# Patient Record
Sex: Female | Born: 1935 | Race: White | Hispanic: No | Marital: Married | Smoking: Former smoker
Health system: Southern US, Community
[De-identification: ages and names within clinical notes are randomized; demographics above are authoritative.]

## PROBLEM LIST (undated history)

## (undated) DIAGNOSIS — C679 Malignant neoplasm of bladder, unspecified: Secondary | ICD-10-CM

## (undated) DIAGNOSIS — I10 Essential (primary) hypertension: Secondary | ICD-10-CM

## (undated) DIAGNOSIS — K219 Gastro-esophageal reflux disease without esophagitis: Secondary | ICD-10-CM

## (undated) DIAGNOSIS — I472 Ventricular tachycardia, unspecified: Secondary | ICD-10-CM

## (undated) DIAGNOSIS — R51 Headache: Secondary | ICD-10-CM

## (undated) DIAGNOSIS — R55 Syncope and collapse: Secondary | ICD-10-CM

## (undated) DIAGNOSIS — R3129 Other microscopic hematuria: Secondary | ICD-10-CM

## (undated) DIAGNOSIS — I493 Ventricular premature depolarization: Secondary | ICD-10-CM

## (undated) DIAGNOSIS — W19XXXA Unspecified fall, initial encounter: Secondary | ICD-10-CM

## (undated) DIAGNOSIS — F419 Anxiety disorder, unspecified: Secondary | ICD-10-CM

## (undated) DIAGNOSIS — E162 Hypoglycemia, unspecified: Secondary | ICD-10-CM

## (undated) DIAGNOSIS — Y92009 Unspecified place in unspecified non-institutional (private) residence as the place of occurrence of the external cause: Secondary | ICD-10-CM

## (undated) DIAGNOSIS — I471 Supraventricular tachycardia, unspecified: Secondary | ICD-10-CM

## (undated) DIAGNOSIS — M5090 Cervical disc disorder, unspecified, unspecified cervical region: Secondary | ICD-10-CM

## (undated) DIAGNOSIS — R011 Cardiac murmur, unspecified: Secondary | ICD-10-CM

## (undated) DIAGNOSIS — I447 Left bundle-branch block, unspecified: Secondary | ICD-10-CM

## (undated) DIAGNOSIS — M47812 Spondylosis without myelopathy or radiculopathy, cervical region: Secondary | ICD-10-CM

## (undated) DIAGNOSIS — N1330 Unspecified hydronephrosis: Secondary | ICD-10-CM

## (undated) DIAGNOSIS — I2510 Atherosclerotic heart disease of native coronary artery without angina pectoris: Secondary | ICD-10-CM

## (undated) DIAGNOSIS — N12 Tubulo-interstitial nephritis, not specified as acute or chronic: Secondary | ICD-10-CM

## (undated) DIAGNOSIS — E785 Hyperlipidemia, unspecified: Secondary | ICD-10-CM

## (undated) DIAGNOSIS — I6529 Occlusion and stenosis of unspecified carotid artery: Secondary | ICD-10-CM

## (undated) DIAGNOSIS — Z9049 Acquired absence of other specified parts of digestive tract: Secondary | ICD-10-CM

## (undated) DIAGNOSIS — M8580 Other specified disorders of bone density and structure, unspecified site: Secondary | ICD-10-CM

## (undated) DIAGNOSIS — T85528A Displacement of other gastrointestinal prosthetic devices, implants and grafts, initial encounter: Secondary | ICD-10-CM

## (undated) DIAGNOSIS — R12 Heartburn: Secondary | ICD-10-CM

## (undated) DIAGNOSIS — F0390 Unspecified dementia without behavioral disturbance: Secondary | ICD-10-CM

## (undated) DIAGNOSIS — N816 Rectocele: Secondary | ICD-10-CM

## (undated) HISTORY — DX: Ventricular premature depolarization: I49.3

## (undated) HISTORY — PX: OTHER SURGICAL HISTORY: SHX169

## (undated) HISTORY — DX: Tubulo-interstitial nephritis, not specified as acute or chronic: N12

## (undated) HISTORY — PX: CYSTECTOMY: SUR359

## (undated) HISTORY — DX: Unspecified dementia, unspecified severity, without behavioral disturbance, psychotic disturbance, mood disturbance, and anxiety: F03.90

## (undated) HISTORY — DX: Ventricular tachycardia: I47.2

## (undated) HISTORY — PX: ABDOMINAL HYSTERECTOMY: SHX81

## (undated) HISTORY — DX: Supraventricular tachycardia: I47.1

## (undated) HISTORY — PX: HERNIA REPAIR: SHX51

## (undated) HISTORY — DX: Anxiety disorder, unspecified: F41.9

## (undated) HISTORY — DX: Heartburn: R12

## (undated) HISTORY — DX: Malignant neoplasm of bladder, unspecified: C67.9

## (undated) HISTORY — DX: Supraventricular tachycardia, unspecified: I47.10

## (undated) HISTORY — DX: Spondylosis without myelopathy or radiculopathy, cervical region: M47.812

## (undated) HISTORY — DX: Rectocele: N81.6

## (undated) HISTORY — DX: Headache: R51

## (undated) HISTORY — DX: Hyperlipidemia, unspecified: E78.5

## (undated) HISTORY — PX: TOTAL ABDOMINAL HYSTERECTOMY W/ BILATERAL SALPINGOOPHORECTOMY: SHX83

## (undated) HISTORY — DX: Unspecified hydronephrosis: N13.30

## (undated) HISTORY — DX: Other microscopic hematuria: R31.29

## (undated) HISTORY — PX: FRACTURE SURGERY: SHX138

## (undated) HISTORY — DX: Ventricular tachycardia, unspecified: I47.20

## (undated) HISTORY — DX: Left bundle-branch block, unspecified: I44.7

## (undated) HISTORY — DX: Hypoglycemia, unspecified: E16.2

## (undated) HISTORY — DX: Atherosclerotic heart disease of native coronary artery without angina pectoris: I25.10

## (undated) HISTORY — PX: CARDIAC CATHETERIZATION: SHX172

## (undated) HISTORY — DX: Unspecified place in unspecified non-institutional (private) residence as the place of occurrence of the external cause: Y92.009

## (undated) HISTORY — PX: URETEROSTOMY: SHX495

## (undated) HISTORY — DX: Unspecified fall, initial encounter: W19.XXXA

## (undated) HISTORY — DX: Displacement of other gastrointestinal prosthetic devices, implants and grafts, initial encounter: T85.528A

## (undated) HISTORY — DX: Cervical disc disorder, unspecified, unspecified cervical region: M50.90

## (undated) HISTORY — DX: Occlusion and stenosis of unspecified carotid artery: I65.29

## (undated) HISTORY — DX: Syncope and collapse: R55

## (undated) HISTORY — DX: Other specified disorders of bone density and structure, unspecified site: M85.80

---

## 2003-11-10 ENCOUNTER — Ambulatory Visit (HOSPITAL_COMMUNITY): Admission: RE | Admit: 2003-11-10 | Discharge: 2003-11-10 | Payer: Self-pay | Admitting: Urology

## 2003-12-03 ENCOUNTER — Ambulatory Visit (HOSPITAL_COMMUNITY): Admission: RE | Admit: 2003-12-03 | Discharge: 2003-12-03 | Payer: Self-pay | Admitting: Urology

## 2004-07-19 ENCOUNTER — Ambulatory Visit (HOSPITAL_COMMUNITY): Admission: RE | Admit: 2004-07-19 | Discharge: 2004-07-19 | Payer: Self-pay | Admitting: Urology

## 2004-11-22 ENCOUNTER — Ambulatory Visit: Payer: Self-pay | Admitting: Gastroenterology

## 2005-07-04 ENCOUNTER — Emergency Department (HOSPITAL_COMMUNITY): Admission: EM | Admit: 2005-07-04 | Discharge: 2005-07-05 | Payer: Self-pay | Admitting: Emergency Medicine

## 2005-08-15 ENCOUNTER — Ambulatory Visit: Payer: Self-pay | Admitting: Internal Medicine

## 2005-08-20 ENCOUNTER — Emergency Department (HOSPITAL_COMMUNITY): Admission: EM | Admit: 2005-08-20 | Discharge: 2005-08-20 | Payer: Self-pay | Admitting: Emergency Medicine

## 2006-06-09 ENCOUNTER — Emergency Department (HOSPITAL_COMMUNITY): Admission: EM | Admit: 2006-06-09 | Discharge: 2006-06-09 | Payer: Self-pay | Admitting: Emergency Medicine

## 2007-05-25 ENCOUNTER — Emergency Department (HOSPITAL_COMMUNITY): Admission: EM | Admit: 2007-05-25 | Discharge: 2007-05-25 | Payer: Self-pay | Admitting: Emergency Medicine

## 2007-10-08 ENCOUNTER — Inpatient Hospital Stay (HOSPITAL_COMMUNITY): Admission: EM | Admit: 2007-10-08 | Discharge: 2007-10-09 | Payer: Self-pay | Admitting: Emergency Medicine

## 2007-10-08 ENCOUNTER — Ambulatory Visit: Payer: Self-pay | Admitting: *Deleted

## 2007-10-09 ENCOUNTER — Encounter (INDEPENDENT_AMBULATORY_CARE_PROVIDER_SITE_OTHER): Payer: Self-pay | Admitting: Gastroenterology

## 2007-10-10 ENCOUNTER — Observation Stay (HOSPITAL_COMMUNITY): Admission: RE | Admit: 2007-10-10 | Discharge: 2007-10-11 | Payer: Self-pay | Admitting: Cardiology

## 2007-10-10 ENCOUNTER — Encounter (INDEPENDENT_AMBULATORY_CARE_PROVIDER_SITE_OTHER): Payer: Self-pay | Admitting: Cardiology

## 2008-06-06 ENCOUNTER — Inpatient Hospital Stay (HOSPITAL_COMMUNITY): Admission: EM | Admit: 2008-06-06 | Discharge: 2008-06-08 | Payer: Self-pay | Admitting: Emergency Medicine

## 2008-07-21 ENCOUNTER — Inpatient Hospital Stay (HOSPITAL_COMMUNITY): Admission: EM | Admit: 2008-07-21 | Discharge: 2008-07-26 | Payer: Self-pay | Admitting: Emergency Medicine

## 2008-08-06 DIAGNOSIS — R05 Cough: Secondary | ICD-10-CM | POA: Insufficient documentation

## 2008-08-06 DIAGNOSIS — R053 Chronic cough: Secondary | ICD-10-CM | POA: Insufficient documentation

## 2008-08-09 ENCOUNTER — Ambulatory Visit: Payer: Self-pay | Admitting: Internal Medicine

## 2008-08-09 DIAGNOSIS — Z8551 Personal history of malignant neoplasm of bladder: Secondary | ICD-10-CM

## 2008-08-20 ENCOUNTER — Encounter: Admission: RE | Admit: 2008-08-20 | Discharge: 2008-08-20 | Payer: Self-pay | Admitting: Internal Medicine

## 2008-11-16 ENCOUNTER — Emergency Department (HOSPITAL_COMMUNITY): Admission: EM | Admit: 2008-11-16 | Discharge: 2008-11-16 | Payer: Self-pay | Admitting: Emergency Medicine

## 2009-08-02 ENCOUNTER — Ambulatory Visit (HOSPITAL_COMMUNITY): Admission: RE | Admit: 2009-08-02 | Discharge: 2009-08-02 | Payer: Self-pay | Admitting: Urology

## 2009-09-27 ENCOUNTER — Ambulatory Visit: Payer: Self-pay | Admitting: Vascular Surgery

## 2010-04-23 IMAGING — CR DG CHEST 2V
2 series · 2 of 2 positions shown · non-contrast
Comparison: 06/06/2008

CLINICAL DATA: Cough, fever

CHEST - 2 VIEW

[w chest pa]
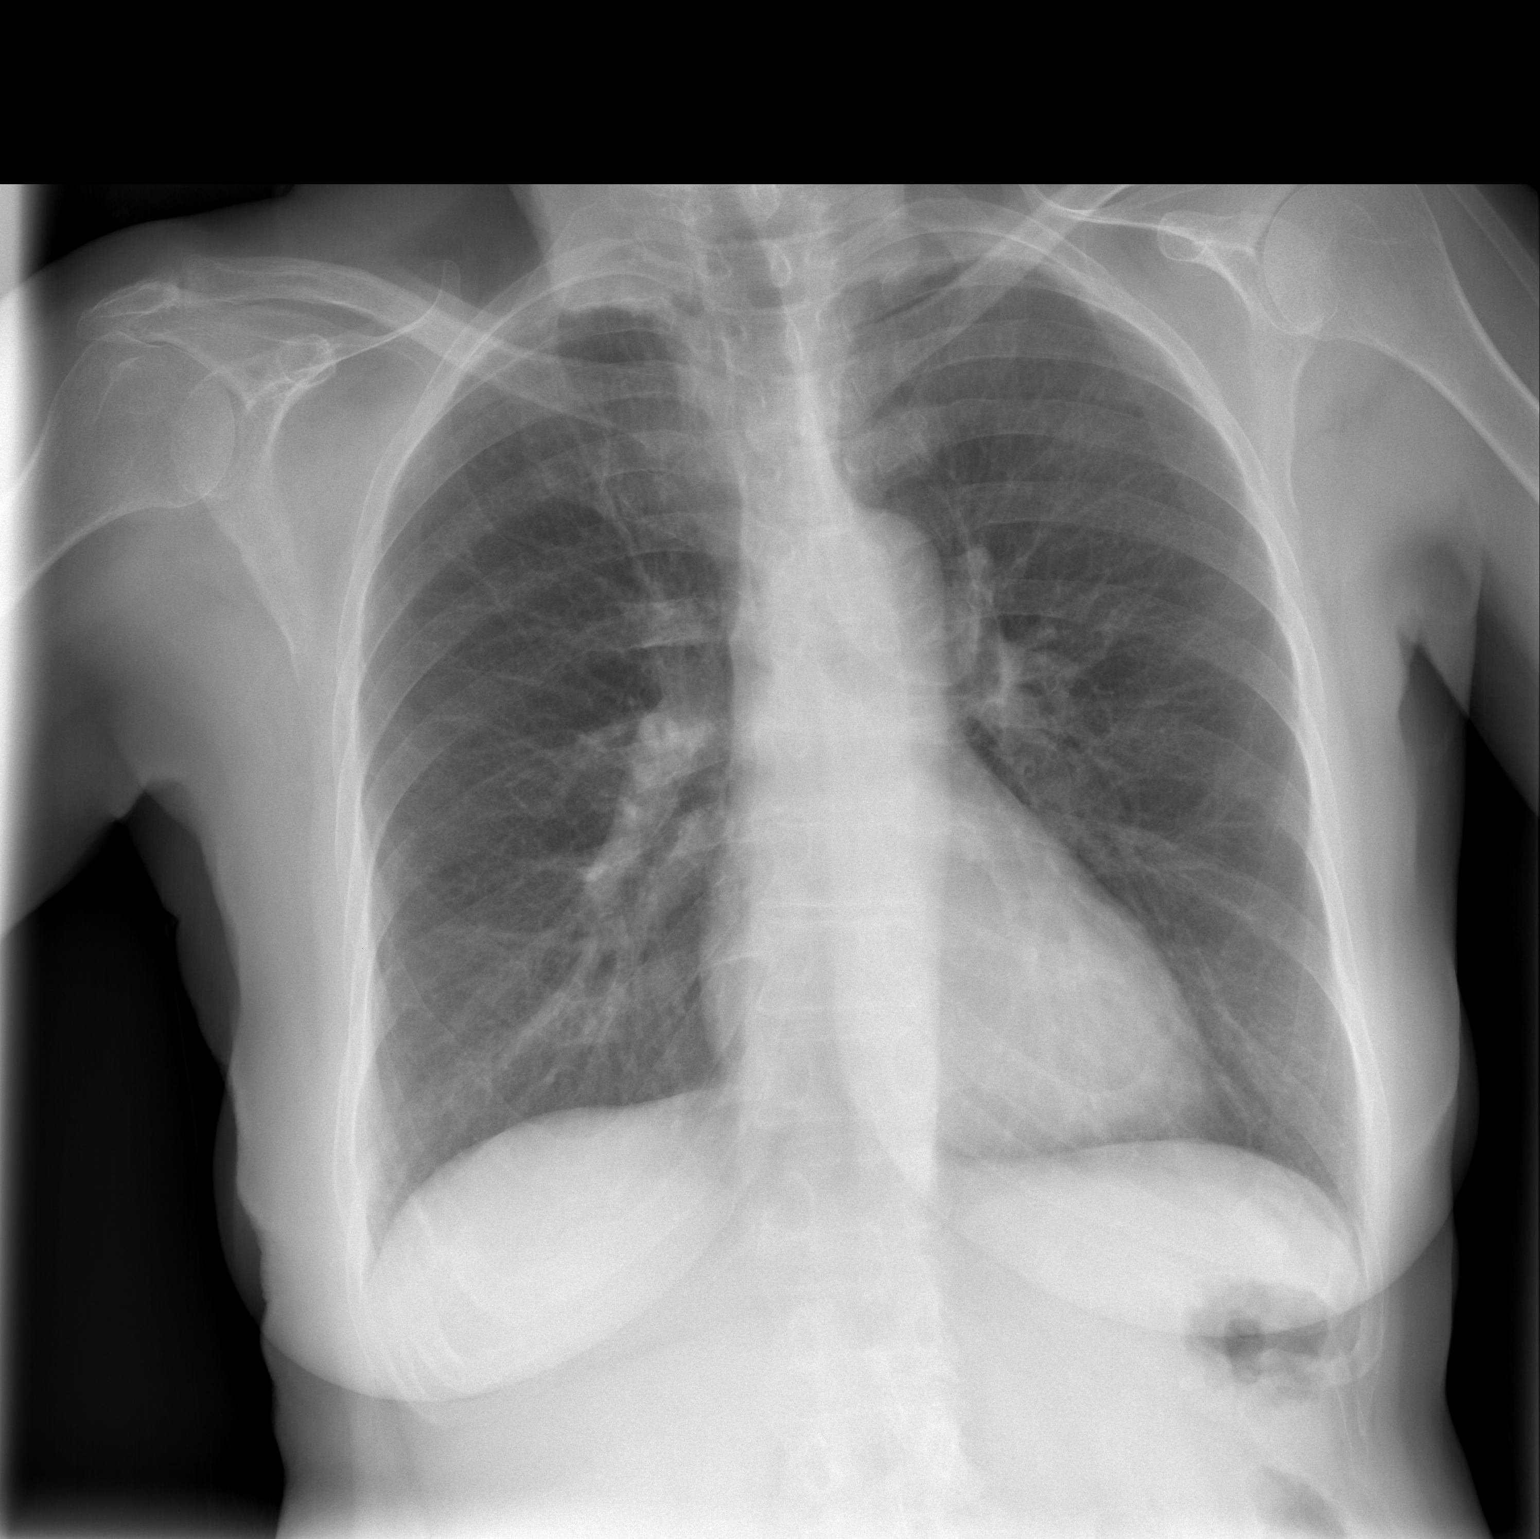

[w chest lat]
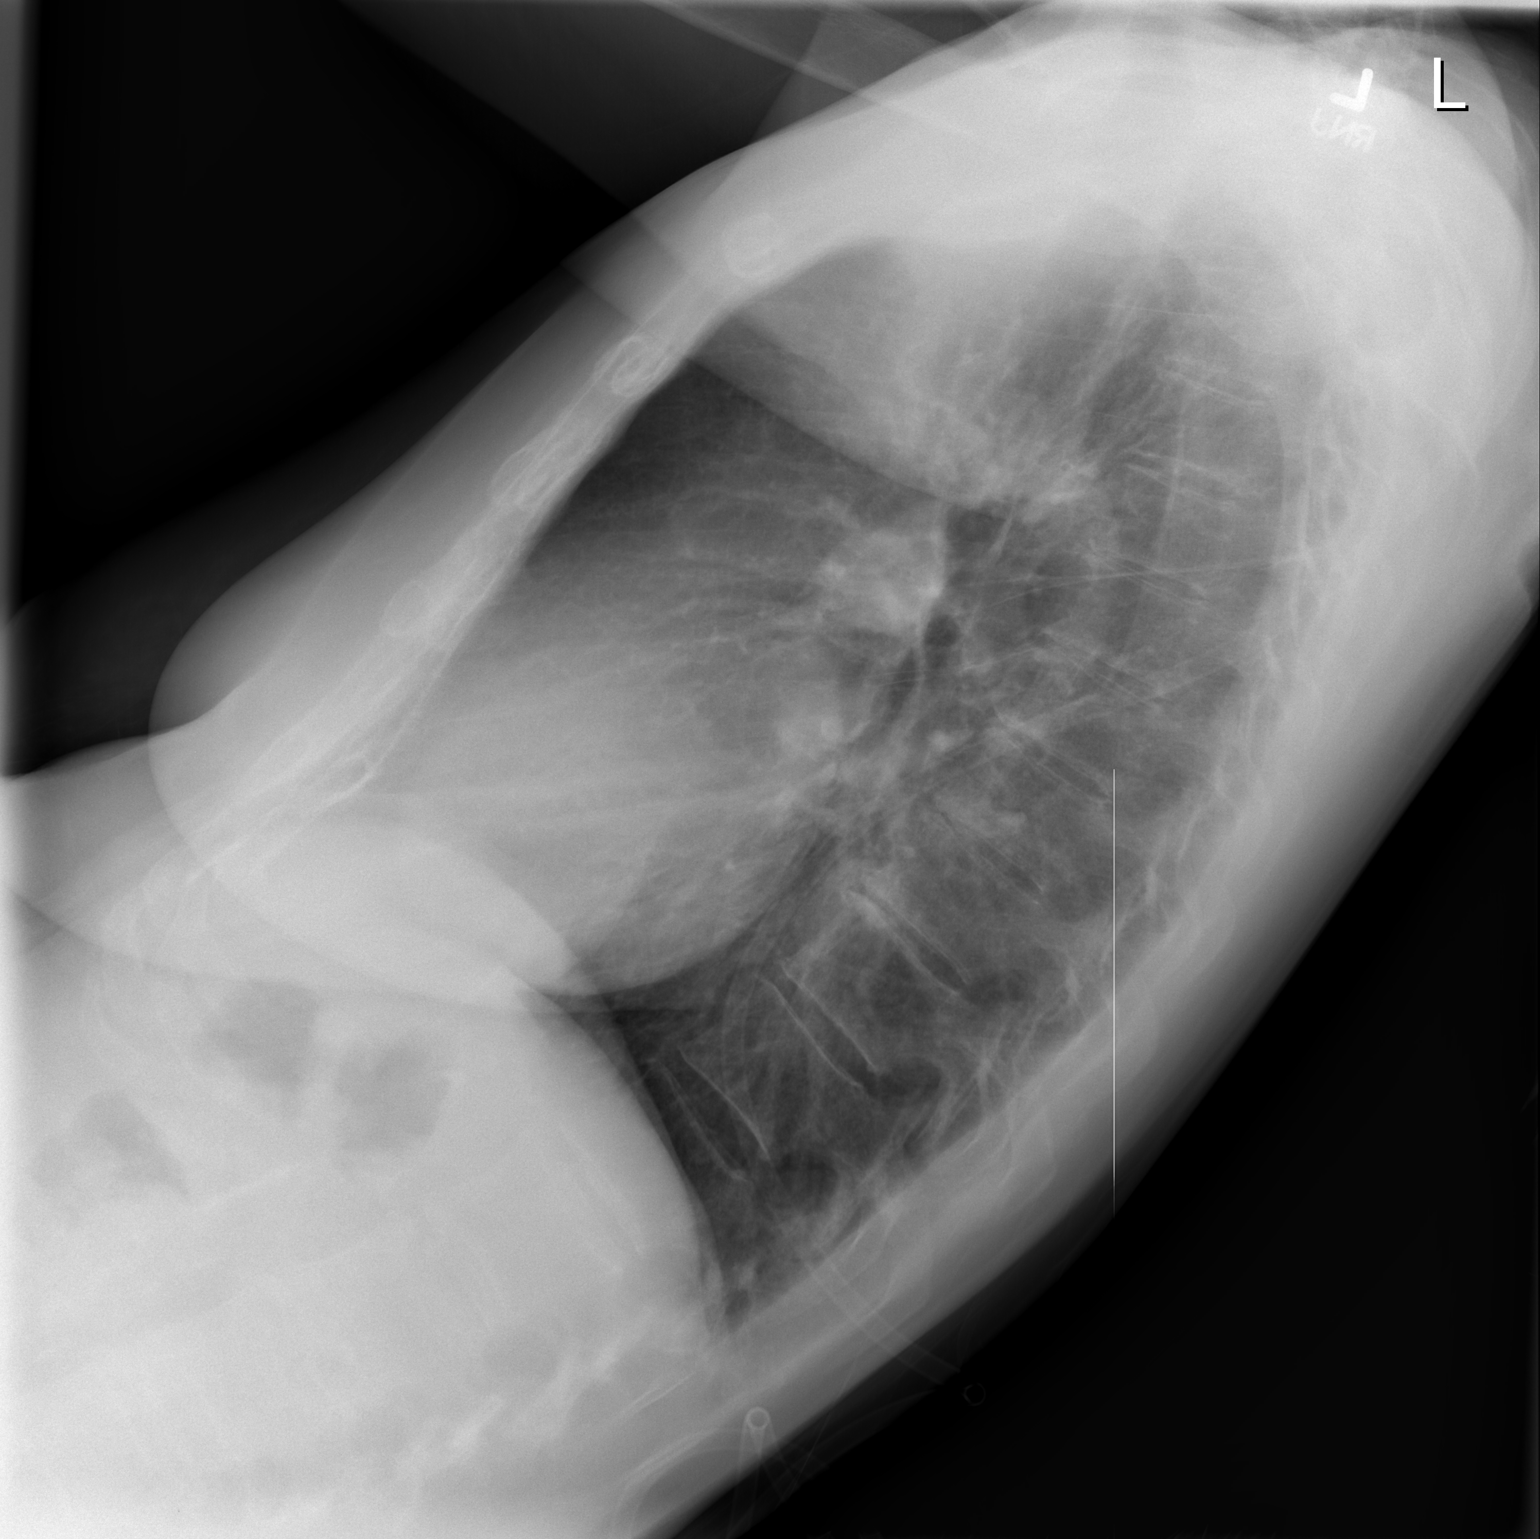

[2 of 2 positions shown; findings below may reference images not displayed]

FINDINGS: Heart is upper limits normal in size.  Lungs are clear.
No effusions.  No acute bony abnormality.
IMPRESSION: No acute findings.

## 2010-04-23 IMAGING — CT CT HEAD W/O CM
1 series · 16 of 30 positions shown, 20 images · non-contrast
Comparison: 05/25/2007

CLINICAL DATA: Confusion and fever

CT HEAD WITHOUT CONTRAST
TECHNIQUE: Contiguous axial images were obtained from the base of
the skull through the vertex without contrast.

[Series 2: headseq 4.8 h45s · axial · 0.43mm/px · z∈[-183,-31]mm · 16 of 36 slices shown, 20 images]
[im 2/36  brain]
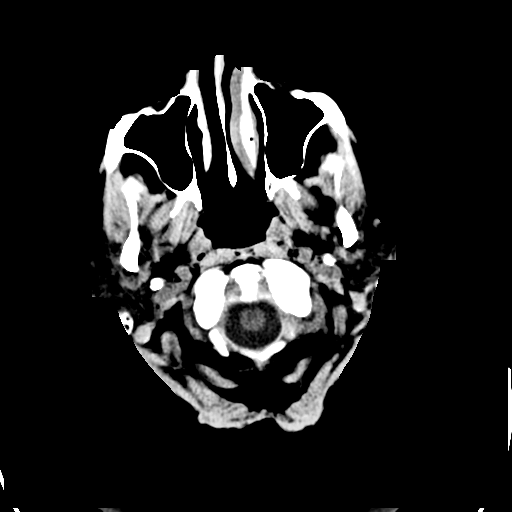
[im 2/36  bone]
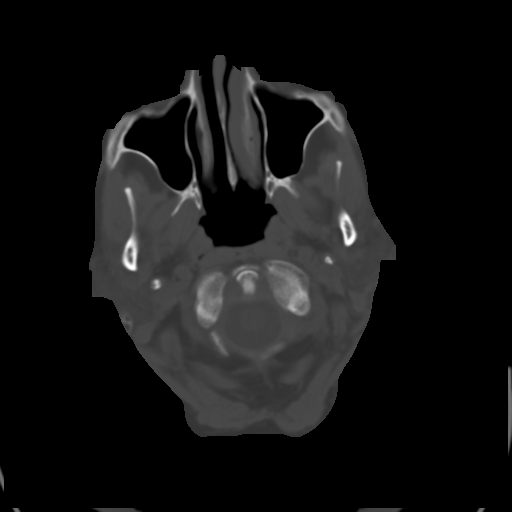
[im 4/36  brain]
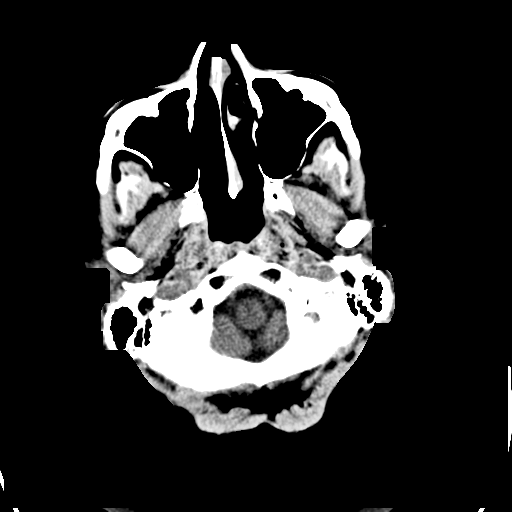
[im 7/36  brain]
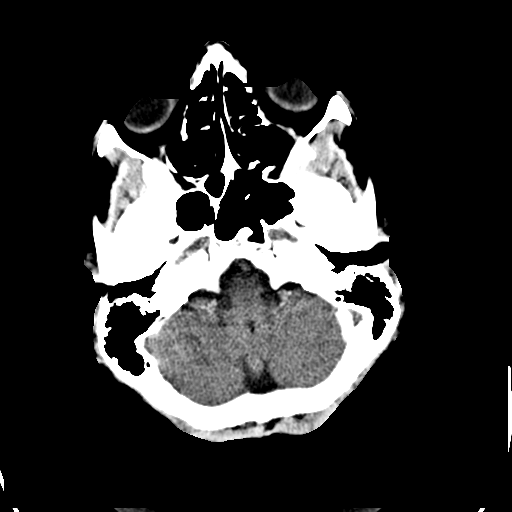
[im 9/36  brain]
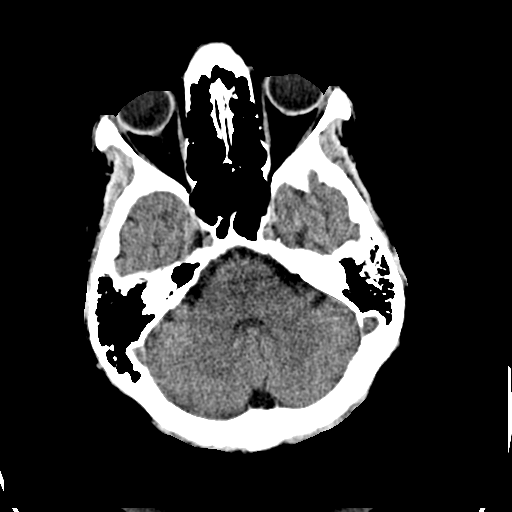
[im 10/36  brain]
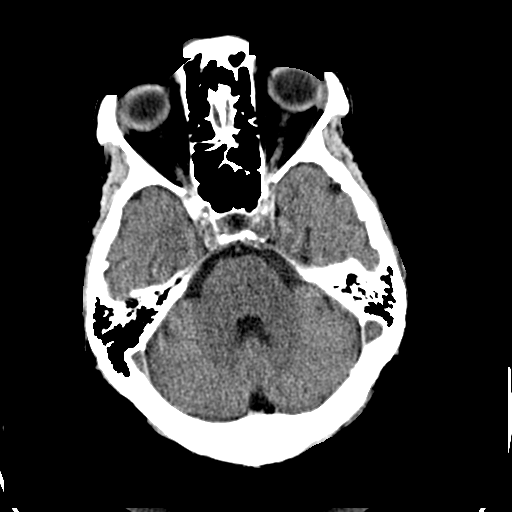
[im 10/36  bone]
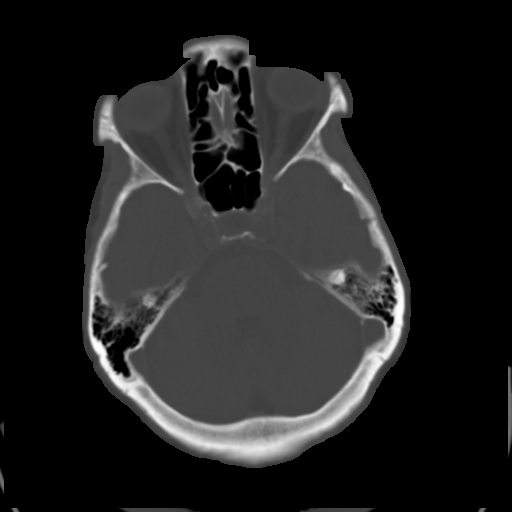
[im 13/36  brain]
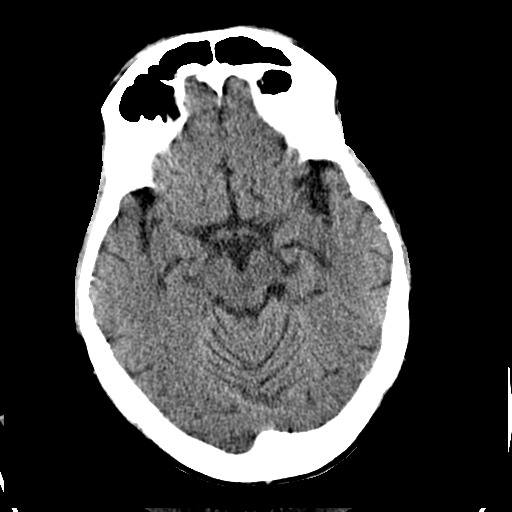
[im 15/36  brain]
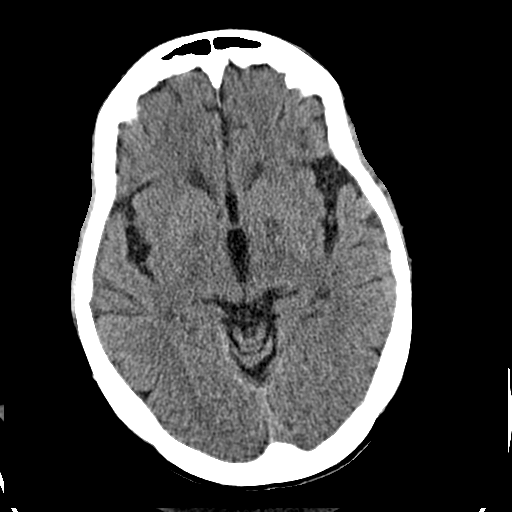
[im 17/36  brain]
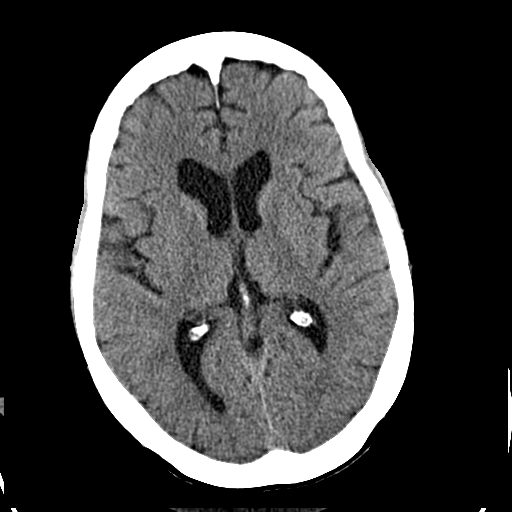
[im 19/36  brain]
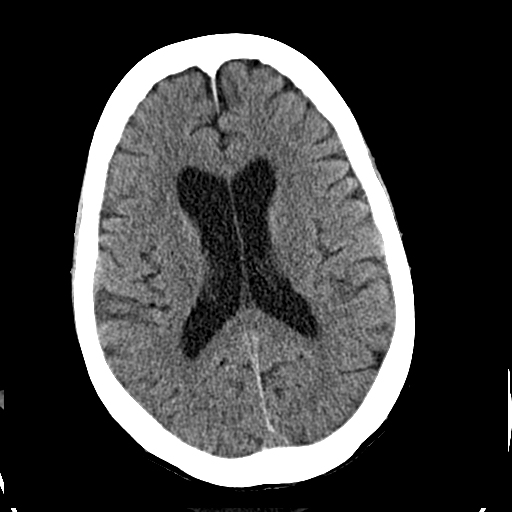
[im 19/36  bone]
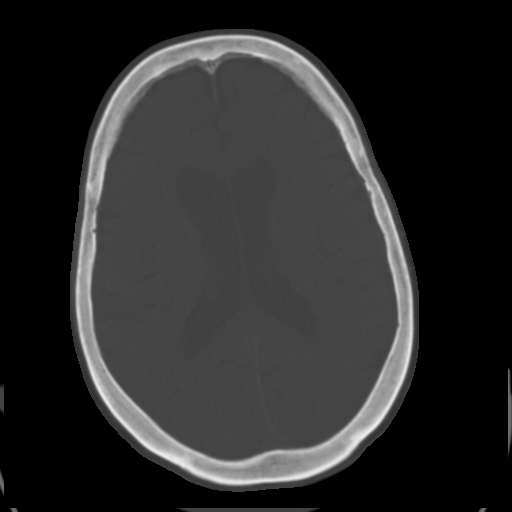
[im 21/36  brain]
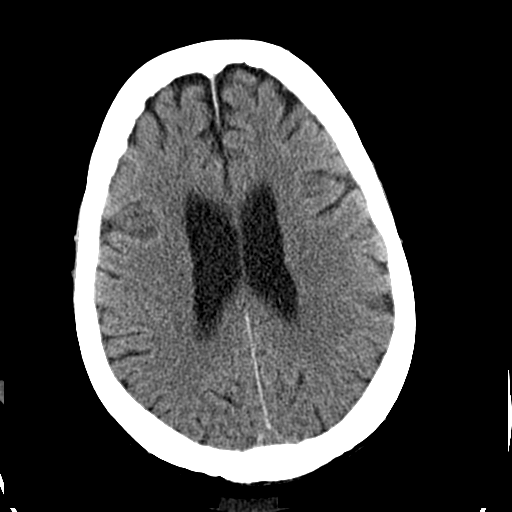
[im 23/36  brain]
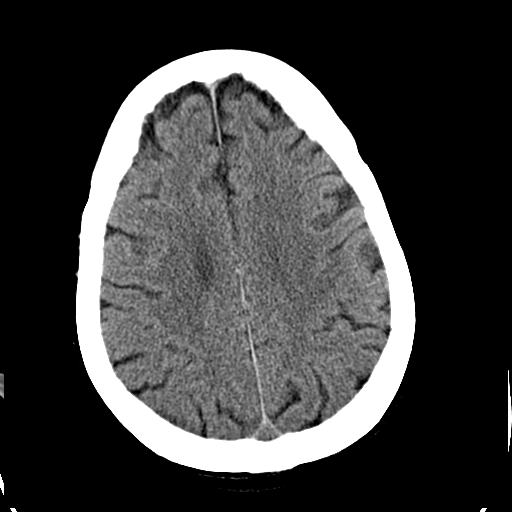
[im 26/36  brain]
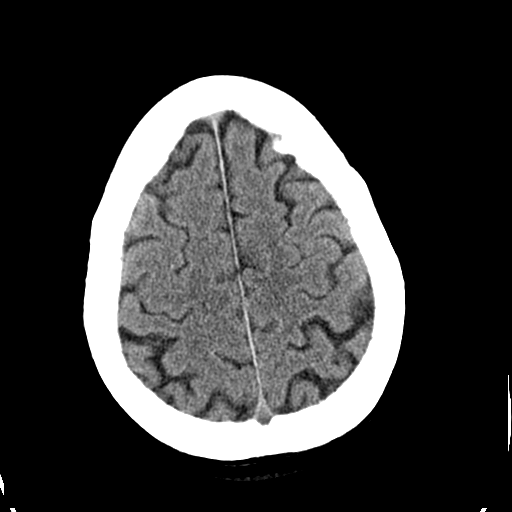
[im 27/36  brain]
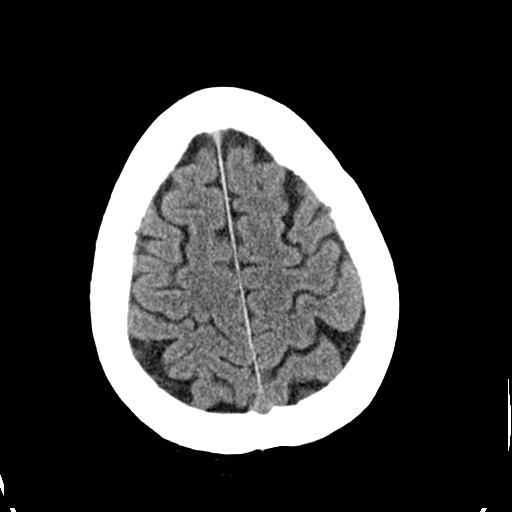
[im 27/36  bone]
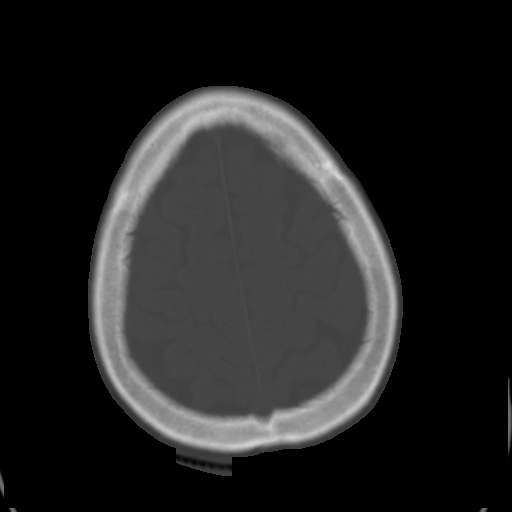
[im 29/36  brain]
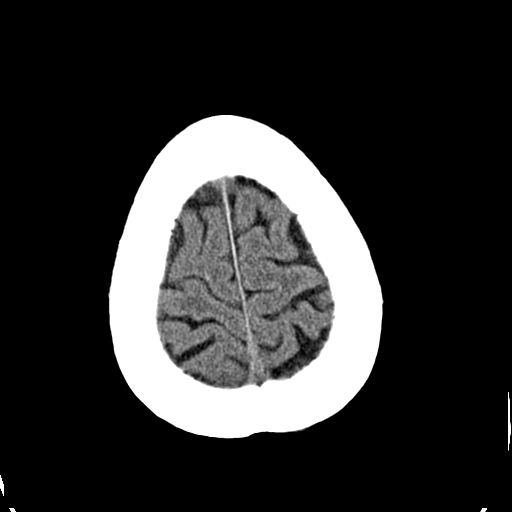
[im 32/36  brain]
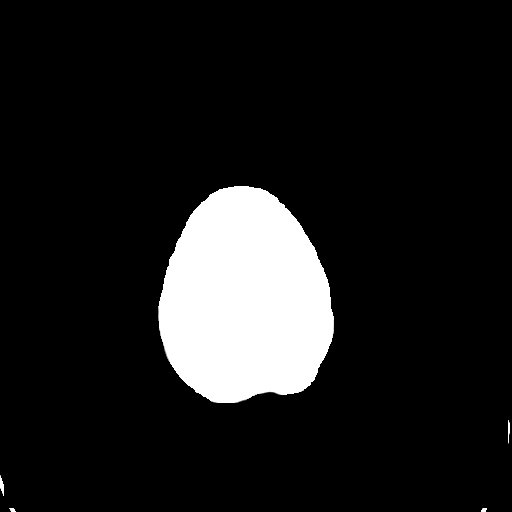
[im 34/36  brain]
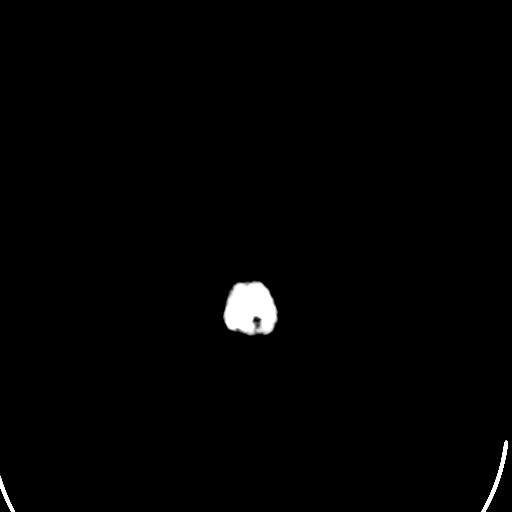

[16 of 30 positions shown; findings below may reference images not displayed]

FINDINGS: Stable bilateral basal ganglia low attenuation foci which
may represent infarcts or prominent perivascular spaces.
Atherosclerotic and physiologic intracranial calcifications. There
is no evidence of acute intracranial hemorrhage, brain edema, mass,
mass effect, or midline shift. Acute infarct may be inapparent on
noncontrast CT.  No other intra-axial abnormalities are seen, and
the ventricles and sulci are within normal limits in size and
symmetry.   No abnormal extra-axial fluid collections or masses are
identified.  No significant calvarial abnormality.
IMPRESSION: 1.  Negative for bleed or other acute intracranial process.

## 2010-11-28 ENCOUNTER — Ambulatory Visit: Payer: Self-pay | Admitting: Vascular Surgery

## 2011-02-09 ENCOUNTER — Emergency Department (HOSPITAL_COMMUNITY): Payer: Medicare Other

## 2011-02-09 ENCOUNTER — Encounter (HOSPITAL_COMMUNITY): Payer: Self-pay | Admitting: Radiology

## 2011-02-09 ENCOUNTER — Inpatient Hospital Stay (HOSPITAL_COMMUNITY)
Admission: EM | Admit: 2011-02-09 | Discharge: 2011-02-13 | DRG: 871 | Disposition: A | Discharge status: Home Health | Payer: Medicare Other | Attending: Internal Medicine | Admitting: Internal Medicine

## 2011-02-09 DIAGNOSIS — E785 Hyperlipidemia, unspecified: Secondary | ICD-10-CM | POA: Diagnosis present

## 2011-02-09 DIAGNOSIS — J810 Acute pulmonary edema: Secondary | ICD-10-CM | POA: Diagnosis not present

## 2011-02-09 DIAGNOSIS — E876 Hypokalemia: Secondary | ICD-10-CM | POA: Diagnosis not present

## 2011-02-09 DIAGNOSIS — G309 Alzheimer's disease, unspecified: Secondary | ICD-10-CM | POA: Diagnosis present

## 2011-02-09 DIAGNOSIS — N390 Urinary tract infection, site not specified: Secondary | ICD-10-CM | POA: Diagnosis present

## 2011-02-09 DIAGNOSIS — J45909 Unspecified asthma, uncomplicated: Secondary | ICD-10-CM | POA: Diagnosis present

## 2011-02-09 DIAGNOSIS — D72829 Elevated white blood cell count, unspecified: Secondary | ICD-10-CM | POA: Diagnosis present

## 2011-02-09 DIAGNOSIS — Z7982 Long term (current) use of aspirin: Secondary | ICD-10-CM

## 2011-02-09 DIAGNOSIS — A4151 Sepsis due to Escherichia coli [E. coli]: Principal | ICD-10-CM | POA: Diagnosis present

## 2011-02-09 DIAGNOSIS — IMO0002 Reserved for concepts with insufficient information to code with codable children: Secondary | ICD-10-CM | POA: Diagnosis present

## 2011-02-09 DIAGNOSIS — N9989 Other postprocedural complications and disorders of genitourinary system: Secondary | ICD-10-CM | POA: Diagnosis present

## 2011-02-09 DIAGNOSIS — Z87891 Personal history of nicotine dependence: Secondary | ICD-10-CM

## 2011-02-09 DIAGNOSIS — M899 Disorder of bone, unspecified: Secondary | ICD-10-CM | POA: Diagnosis present

## 2011-02-09 DIAGNOSIS — N1330 Unspecified hydronephrosis: Secondary | ICD-10-CM | POA: Diagnosis present

## 2011-02-09 DIAGNOSIS — Y833 Surgical operation with formation of external stoma as the cause of abnormal reaction of the patient, or of later complication, without mention of misadventure at the time of the procedure: Secondary | ICD-10-CM | POA: Diagnosis present

## 2011-02-09 DIAGNOSIS — M949 Disorder of cartilage, unspecified: Secondary | ICD-10-CM | POA: Diagnosis present

## 2011-02-09 DIAGNOSIS — F411 Generalized anxiety disorder: Secondary | ICD-10-CM | POA: Diagnosis present

## 2011-02-09 DIAGNOSIS — A419 Sepsis, unspecified organism: Secondary | ICD-10-CM | POA: Diagnosis present

## 2011-02-09 DIAGNOSIS — R3129 Other microscopic hematuria: Secondary | ICD-10-CM | POA: Diagnosis present

## 2011-02-09 DIAGNOSIS — I10 Essential (primary) hypertension: Secondary | ICD-10-CM | POA: Diagnosis present

## 2011-02-09 DIAGNOSIS — I447 Left bundle-branch block, unspecified: Secondary | ICD-10-CM | POA: Diagnosis present

## 2011-02-09 DIAGNOSIS — R404 Transient alteration of awareness: Secondary | ICD-10-CM | POA: Diagnosis present

## 2011-02-09 DIAGNOSIS — F0280 Dementia in other diseases classified elsewhere without behavioral disturbance: Secondary | ICD-10-CM | POA: Diagnosis present

## 2011-02-09 HISTORY — DX: Acquired absence of other specified parts of digestive tract: Z90.49

## 2011-02-09 HISTORY — DX: Essential (primary) hypertension: I10

## 2011-02-09 HISTORY — DX: Gastro-esophageal reflux disease without esophagitis: K21.9

## 2011-02-09 LAB — URINALYSIS, ROUTINE W REFLEX MICROSCOPIC
Ketones, ur: 40 mg/dL — AB
Protein, ur: 100 mg/dL — AB
Urobilinogen, UA: 0.2 mg/dL (ref 0.0–1.0)

## 2011-02-09 LAB — CBC
HCT: 33.5 % — ABNORMAL LOW (ref 36.0–46.0)
Hemoglobin: 11.2 g/dL — ABNORMAL LOW (ref 12.0–15.0)
MCH: 31.6 pg (ref 26.0–34.0)
MCHC: 33.4 g/dL (ref 30.0–36.0)
MCV: 94.6 fL (ref 78.0–100.0)

## 2011-02-09 LAB — DIFFERENTIAL
Basophils Absolute: 0 10*3/uL (ref 0.0–0.1)
Basophils Relative: 0 % (ref 0–1)
Eosinophils Absolute: 0 10*3/uL (ref 0.0–0.7)
Eosinophils Relative: 0 % (ref 0–5)
Lymphocytes Relative: 11 % — ABNORMAL LOW (ref 12–46)
Lymphs Abs: 0.8 10*3/uL (ref 0.7–4.0)
Monocytes Absolute: 0.8 10*3/uL (ref 0.1–1.0)
Monocytes Relative: 11 % (ref 3–12)
Neutro Abs: 5.8 10*3/uL (ref 1.7–7.7)
Neutrophils Relative %: 78 % — ABNORMAL HIGH (ref 43–77)

## 2011-02-09 LAB — COMPREHENSIVE METABOLIC PANEL
BUN: 16 mg/dL (ref 6–23)
CO2: 27 mEq/L (ref 19–32)
Chloride: 100 mEq/L (ref 96–112)
Creatinine, Ser: 0.83 mg/dL (ref 0.4–1.2)
GFR calc non Af Amer: >60 mL/min (ref >60)
Total Bilirubin: 0.8 mg/dL (ref 0.3–1.2)

## 2011-02-09 LAB — URINE MICROSCOPIC-ADD ON

## 2011-02-10 LAB — CBC
HCT: 29.3 % — ABNORMAL LOW (ref 36.0–46.0)
Hemoglobin: 9.9 g/dL — ABNORMAL LOW (ref 12.0–15.0)
MCV: 93.9 fL (ref 78.0–100.0)
RBC: 3.12 MIL/uL — ABNORMAL LOW (ref 3.87–5.11)
RDW: 13.1 % (ref 11.5–15.5)
WBC: 6.8 10*3/uL (ref 4.0–10.5)

## 2011-02-10 LAB — BASIC METABOLIC PANEL
BUN: 8 mg/dL (ref 6–23)
CO2: 22 mEq/L (ref 19–32)
Chloride: 107 mEq/L (ref 96–112)
Glucose, Bld: 121 mg/dL — ABNORMAL HIGH (ref 70–99)
Potassium: 4 mEq/L (ref 3.5–5.1)
Sodium: 136 mEq/L (ref 135–145)

## 2011-02-11 ENCOUNTER — Inpatient Hospital Stay (HOSPITAL_COMMUNITY): Payer: Medicare Other

## 2011-02-11 LAB — CULTURE, BLOOD (ROUTINE X 2): Culture  Setup Time: 201202171324

## 2011-02-11 LAB — BASIC METABOLIC PANEL
BUN: 8 mg/dL (ref 6–23)
Chloride: 108 mEq/L (ref 96–112)
GFR calc Af Amer: >60 mL/min (ref >60)
GFR calc non Af Amer: >60 mL/min (ref >60)
Potassium: 3.2 mEq/L — ABNORMAL LOW (ref 3.5–5.1)

## 2011-02-11 LAB — CBC
Platelets: 134 10*3/uL — ABNORMAL LOW (ref 150–400)
RBC: 3.19 MIL/uL — ABNORMAL LOW (ref 3.87–5.11)
RDW: 13.1 % (ref 11.5–15.5)
WBC: 6.8 10*3/uL (ref 4.0–10.5)

## 2011-02-11 LAB — URINE CULTURE
Colony Count: >=100000
Culture  Setup Time: 201202170955

## 2011-02-12 LAB — CBC
HCT: 29.3 % — ABNORMAL LOW (ref 36.0–46.0)
MCV: 92.4 fL (ref 78.0–100.0)
Platelets: 158 10*3/uL (ref 150–400)
RBC: 3.17 MIL/uL — ABNORMAL LOW (ref 3.87–5.11)
RDW: 13.1 % (ref 11.5–15.5)
WBC: 7.3 10*3/uL (ref 4.0–10.5)

## 2011-02-12 LAB — BASIC METABOLIC PANEL
BUN: 13 mg/dL (ref 6–23)
Chloride: 107 mEq/L (ref 96–112)
GFR calc non Af Amer: >60 mL/min (ref >60)
Glucose, Bld: 127 mg/dL — ABNORMAL HIGH (ref 70–99)
Potassium: 4.1 mEq/L (ref 3.5–5.1)
Sodium: 141 mEq/L (ref 135–145)

## 2011-02-12 NOTE — H&P (Signed)
NAMECHANTAE, SOO NO.:  [id number]  MEDICAL RECORD NO.:  [id number]           PATIENT TYPE:  I  LOCATION:  5126                         FACILITY:  [facility]  PHYSICIAN:  Kari Baars, M.D.  DATE OF BIRTH:  September 15, 1926  DATE OF ADMISSION:  02/09/2011 DATE OF DISCHARGE:                             HISTORY & PHYSICAL   CHIEF COMPLAINT:  Fever and urinary changes.  HISTORY OF PRESENT ILLNESS:  Mrs. Madewell is an 77 year old white female with a history of bladder cancer, status post cystectomy with ureteroileal conduit, mild dementia, and chronic back pain, who presented to the emergency department today with a complaint of chills and fever associated with right lower quadrant abdominal pain around her stoma.  The patient has a history of bladder cancer status post cystectomy with ureteroileal conduit (2002).  Since that time, she has had recurrent urinary tract infections including a recent UTI treated by Dr. Aldean Ast in January 2012.  At that time, she was having some increasing pelvic pain and lower back pain.  This was treated with Levaquin with improvement.  However, she continued to have pelvic and lower back pain.  This was evaluated by Dr. Aldean Ast with a CT of the abdomen and pelvis which was stable with no evidence of recurrent bladder cancer.  Dr. Tinnie Gens evaluated it with a MRI of the lumbar spine which did show some mild disk disease but no acute findings.  We decided to treat her for musculoskeletal sources of pain with meloxicam and cautious use of muscle relaxants.  The patient states that she was doing fairly well until 2 days ago when she developed chills, fever and worsening right lower quadrant pain.  She also noticed that her urine changed with dark color and flecks of tissue in the ostomy bag.  She went to dinner last night and was very lethargic at dinner.  Her husband states that she was not speaking much at all.  He noticed that she  was more confused.  This morning, she was noted to have a subjective fever. He brought her to the emergency department where her temperature was 102.3.  CT of the abdomen and pelvis was repeated that showed no acute changes but did show chronic bilateral hydronephrosis related to her prior reflux and possible ureteral stenosis.  Given the fever, chills, she is being admitted for further management.  REVIEW OF SYSTEMS:  All systems reviewed with the patient are negative except in the HPI with following exceptions:  She does have minimal cough.  Denies any myalgias.  Denies neck pain.  No rash.  PAST MEDICAL HISTORY: 1. Hypertension. 2. Hyperlipidemia. 3. Bladder cancer status post cystectomy/ureterostomy (2002). 4. History of supraventricular tachycardia. 5. Carotid stenosis, left 40-59%, right 20-39% in October 2010. 6. Vitamin D deficiency. 7. Osteopenia. 8. Anxiety disorder. 9. Asthma. 10.Coronary artery disease, nonobstructive on cardiac catheterization     in October 2008 with 40% LAD. 11.Left bundle-branch block. 12.Mild dementia. 13.Chronic microscopic hematuria.  PAST SURGICAL HISTORY: 1. Left wrist fracture repair. 2. Status post total abdominal hysterectomy/bilateral salpingo-     oophorectomy. 3. Status post hernia repair. 4. Status  post neck surgery. 5. Status post cystectomy/ureterostomy as above.  CURRENT MEDICATIONS: 1. Amlodipine 5 mg daily. 2. Aspirin 81 mg daily. 3. Atenolol 25 mg b.i.d. 4. Crestor 10 mg every other day. 5. Aricept 10 mg nightly. 6. Zegerid  over-the-counter daily. 7. Multivitamin daily. 8. Namenda - started on February 07, 2011. 9. Meloxicam 7.5 mg b.i.d. 10.Flexeril 10 mg q.8 h. p.r.n. muscle spasm.  ALLERGIES:  Multiple and include DILAUDID, MORPHINE, DEMEROL, PENICILLIN, NOVOCAINE, IODINE, ERYTHROMYCIN, TETRACYCLINE, SULFA, CLINDAMYCIN, TRAZODONE, DESIPRAMINE, URECHOLINE, TYLENOL PM, INDERAL, RED DYE, CELEBREX, CODEINE, SUGAR,  ADVIL, RESTORIL, PAXIL, ELAVIL, CELEXA, VALIUM, BENADRYL, SCOPOLAMINE.  She has reported allergy to [facility], although she has tolerated Levaquin in the past.  SOCIAL HISTORY:  She is married to Vazquez since 2004.  This is the second marriage for her.  She has 3 children with her first husband and 8 grandchildren.  Her children live in [place].  She moved here from [place] to [place].  She is a high school education.  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  Former smoker, but quit at age 24. Moderate alcohol use.  FAMILY HISTORY:  Father died of lung cancer at 15.  Mother died of heart disease at age 59.  Sister died of heart disease at 49.  She has 2 sons, 1 daughter who are all healthy.  PHYSICAL EXAM:  VITAL SIGNS:  Temperature 102.3, initially 98.5, currently, blood pressure 130/53, initially 111/62, currently, pulse 96, respirations 16, oxygen saturation 96% on room air. GENERAL:  Pale-appearing with shaking rigors. HEENT:  Oropharynx is moist.  No scleral icterus. NECK:  Supple without lymphadenopathy, JVD or carotid bruits. HEART:  Regular rate and rhythm without murmurs, rubs or gallops. LUNGS:  Clear to auscultation bilaterally. ABDOMEN:  Soft, nondistended, nontender with normoactive bowel sounds. She does have a right lower quadrant stoma which is healthy with dark yellow urine and small flecks of tissue in the ostomy bag.  She has minimal parastomal tenderness. EXTREMITIES:  No clubbing, cyanosis or edema. SKIN:  No rash.  LABS:  CBC shows a white count of 7.4, hemoglobin 11.2, platelets 136. BMET significant for sodium 136, potassium 4.3, chloride 100, bicarb 27, BUN 16, creatinine 0.8, glucose 119.  Liver function tests are normal. Lactic acid 1.0.  Procalcitonin 0.42 which is normal.  Urinalysis has too numerous to count white blood cells and too numerous to count red blood cells.  STUDIES:  CT of the abdomen and pelvis without contrast shows no  acute changes.  Chronic bilateral hydronephrosis (right greater than left) likely due to chronic reflux and/or stenosis involving the ureteral insertion into the ileal conduit.  Peristomal hernia.  ASSESSMENT/PLAN: 1. Possible sepsis/systemic inflammatory response syndrome (SIRS)     likely secondary to urinary source - she has focal symptoms over     the right lower quadrant stoma with urinary changes suggesting     urinary source of her fever.  She does not have a leukocytosis, but     with mild altered mental status concern for impending sepsis.  She     will be admitted for IV antibiotics and IV fluids.  We will notify     Dr. Aldean Ast of her admission and question whether any additional     evaluation is necessary for her stoma/ureterostomy.  She has had     recurrent urinary tract infections in the past.  Multiple     antibiotic allergies limit options, but we will use Elita Quick to cover  Pseudomonas, pending urine cultures.  She did tolerate this in the     emergency department despite of reported PENICILLIN ALLERGY.     Further treatment will depend on her blood and urine cultures which     have been obtained at this time. 2. Altered mental status - likely secondary to possible     sepsis/systemic inflammatory response syndrome.  We will monitor in     the setting of her mild dementia.  Avoid sedating medications.  We     will hold Namenda for now that may exacerbate any mental status     changes and may cloud the issue.  We will continue her Aricept. 3. Hypertension - her blood pressure is okay.  We will hold her blood     pressure medications if her blood pressure drops below 100/50, or     if urine output declines. 4. Disposition - anticipate that she will be able to return home in 2-     3 days.  She has a very supportive husband.     Kari Baars, M.D.     WS/MEDQ  D:  02/09/2011  T:  02/10/2011  Job:  [id number]  cc:   Courtney Paris,  M.D.  Electronically Signed by Lacretia Nicks. Buren Kos M.D. on 02/12/2011 09:51:30 PM

## 2011-02-13 LAB — CBC
HCT: 27.9 % — ABNORMAL LOW (ref 36.0–46.0)
Hemoglobin: 9.4 g/dL — ABNORMAL LOW (ref 12.0–15.0)
MCV: 92.7 fL (ref 78.0–100.0)
WBC: 6.5 10*3/uL (ref 4.0–10.5)

## 2011-02-13 LAB — BASIC METABOLIC PANEL
BUN: 13 mg/dL (ref 6–23)
CO2: 25 mEq/L (ref 19–32)
Chloride: 106 mEq/L (ref 96–112)
Glucose, Bld: 114 mg/dL — ABNORMAL HIGH (ref 70–99)
Potassium: 3.5 mEq/L (ref 3.5–5.1)
Sodium: 138 mEq/L (ref 135–145)

## 2011-02-25 NOTE — Discharge Summary (Signed)
NAMEANNISTEN, MANCHESTER NO.:  [id number]  MEDICAL RECORD NO.:  [id number]           PATIENT TYPE:  I  LOCATION:  5126                         FACILITY:  [facility]  PHYSICIAN:  Kari Baars, M.D.  DATE OF BIRTH:  1925/12/31  DATE OF ADMISSION:  02/10/2011 DATE OF DISCHARGE:  02/13/2011                              DISCHARGE SUMMARY   DISCHARGE DIAGNOSES: 1. Sepsis secondary to Escherichia coli bacteremia. 2. Urinary tract infection. 3. Right lower quadrant abdominal pain secondary to infected     ureterostomy. 4. Delirium secondary to sepsis. 5. Mild dementia, likely Alzheimer's type. 6. Hypokalemia. 7. Mild acute pulmonary edema, resolved. 8. Hypertension. 9. Hyperlipidemia. 10.History of bladder cancer, status post cystectomy with ureterostomy     (2002) with ileal conduit. 11.History of supraventricular tachycardia. 12.Carotid stenosis (40-59% on the left and 20-39% on the right) in     October 2010. 13.Osteopenia with vitamin D deficiency. 14.Anxiety disorder. 15.Coronary artery disease, nonobstructive on cardiac catheterization     in October 2008 with 40% left anterior descending stenosis. 16.Asthma. 17.Left bundle-branch block. 18.Status post left wrist fracture repair. 19.Status post total abdominal hysterectomy/bilateral salpingo-     oophorectomy. 20.Status post hernia repair. 21.Status post neck surgery. 22.Status post cystectomy/ureterostomy.  DISCHARGE MEDICATIONS: 1. Rocephin 1 gram IV daily x9 days to complete a 14-day course. 2. Tylenol 650 mg q.6 h. p.r.n. 3. Amlodipine 5 mg daily. 4. Aspirin 81 mg daily. 5. Atenolol 25 mg b.i.d. 6. Crestor 10 mg every other day at bedtime. 7. Calcium/vitamin D twice a day. 8. Aricept 10 mg nightly. 9. Fish oil over the counter daily. 10.Zegerid 1 capsule daily. 11.Ambien CR 12.5 mg at bedtime p.r.n. insomnia. 12.She was instructed to hold her Namenda until the followup visit and     to  discontinue the use of Flexeril.  HOSPITAL PROCEDURES: 1. CT of the abdomen and pelvis without contrast (February 09, 2011),     no acute abnormalities in abdomen or pelvis.  Parastomal hernia     containing normal-appearing transverse colon.  Chronic bilateral     hydronephrosis, right greater than left, likely due to chronic     reflux and/or stenosis involving the ureteral insertions into the     ileal conduit.  Sigmoid diverticulosis without evidence of     diverticulitis. 2. Chest x-ray on February 17 and February 19 revealing developing     pulmonary edema and small bilateral effusions.  HISTORY OF PRESENT ILLNESS:  For full details, please see dictated history and physical.  Briefly, [name] is an 77 year old white female with a history of bladder cancer, status post cystectomy with ureteroileal conduit, mild dementia, and chronic back pain, who presented to the emergency department with complaint of chills and fever associated with right lower quadrant pain around her stoma.  The patient has a history of bladder cancer, status post cystectomy with ureteroileal conduit in 2002.  Since that time, she has had recurrent urinary tract infections including most recent UTI treated by Dr. Aldean Ast in January 2012.  Given her multiple allergies in the past, she is typically treated with Levaquin, which she received for  several days at that time.  At that time, she was having worsening pelvic and lower back pain, which improved slightly with Levaquin.  However, she has continued to have pelvic and lower back pain which has been evaluated by Dr. Aldean Ast since then with a CT of the abdomen and pelvis, which showed no evidence of recurrent bladder cancer and no complicating intra-abdominal process.  She also saw Dr. Darrelyn Hillock with Orthopedics who did an MRI of the lumbar spine, which did show some mild disk disease, but no acute findings.  When I saw her recently.  We gave her  meloxicam and muscle relaxant to try to treat this pain.  She states that she was doing fairly well until 2 days prior to admission when she developed chills, fever, and worsening right lower quadrant pain.  She also noticed a change in the appearance of the urine in her ostomy bag with dark color and flecks of tissue.  On the evening prior to admission, her husband noticed that she was much more lethargic and was speaking minimally at dinner.  On the morning of admission, she had a fever and was brought to the emergency department where temperature was 102.3.  CT of the abdomen and pelvis was repeated that showed no acute process, but showed chronic bilateral hydronephrosis related to her ureterostomy.  She was admitted for further management.  HOSPITAL COURSE:  The patient was placed empirically starting in the emergency department on [place].  She does have multiple allergies, which limited her options including an allergy to PENICILLIN.  She tolerated the [place] well, so this was continued.  A urinalysis was obtained that showed too numerous to count white blood cells and too numerous to count red blood cells.  Urine and blood cultures were sent.  On the evening of admission, her blood cultures subsequently returned positive for gram- negative rods.  At that time, Levaquin was added to her regimen for broad-spectrum coverage pending identification and sensitivities. Culture subsequently grew E. coli, which was sensitive to the [facility] as well as the Rocephin and resistant to Cipro.  Therefore, her Levaquin was discontinued and she was transitioned to Rocephin for ease of administration.  With this treatment, her fever resolved.  In addition, her right lower quadrant and back pain seemed to improve as well.  She had some transient hypotension, which improved with IV fluids as well in addition to the antibiotics.  The patient's biggest issue throughout her hospitalization was waxing and  waning mental status with delirium.  In the setting of her known mild dementia, she appears to have sundowning related to her infection and familiar surroundings.  Her husband stated that she had a similar episode when hospitalized in [place] a year ago and improved when she was discharged home.  She was given limited doses of Ativan and isolated doses of Haldol for her agitation with temporary improvement.  However, it was felt that she would benefit most from returning to her familiar surroundings at home.  Therefore, given resolution of her fever, hypotension, and overall improvement in her clinical condition, she was deemed stable for discharge home.  A midline IV was placed in order to continue her IV antibiotics at home to complete a 14-day course.  DISCHARGE LABS:  CBC shows a white count of 6.5, hemoglobin 9.4, platelets 168.  BMET significant for sodium 138, potassium 3.5, chloride 106, bicarb 25, BUN 13, creatinine 0.59, glucose 114, calcium 8.4. Blood cultures positive x2 for E. coli, sensitive to Rocephin  and Fortaz, and resistant to Cipro and ampicillin.  Urine cultures have greater than 100,000 colonies with multiple bacterial species.  Lactic acid and procalcitonin levels were normal on admission.  DISCHARGE INSTRUCTIONS:  She was instructed to call if she has increasing fever above 101.5, increasing abdominal pain, or worsening confusion.  HOSPITAL FOLLOWUP:  She will follow up with Dr. Clelia Croft at [facility] in 1 week.  Her midline IV will be removed after completion of antibiotics.  Home health physical therapy, occupational therapy, and nursing care will be will ordered to assist with transition home and ongoing antibiotic treatment.  DISCHARGE DIET:  Cardiac prudent.  CONDITION ON DISCHARGE:  Improved.  DISPOSITION:  To home with her husband with home health care.  TIME OF DISCHARGE ACTIVITIES:  Thirty-five minutes.     Kari Baars,  M.D.     WS/MEDQ  D:  02/14/2011  T:  02/14/2011  Job:  [id number]  cc:   Courtney Paris, M.D.  Electronically Signed by Lacretia Nicks. Buren Kos M.D. on 02/25/2011 09:53:08 PM

## 2011-04-19 ENCOUNTER — Other Ambulatory Visit (HOSPITAL_COMMUNITY): Payer: Self-pay | Admitting: Orthopedic Surgery

## 2011-04-19 DIAGNOSIS — M545 Low back pain, unspecified: Secondary | ICD-10-CM

## 2011-04-30 ENCOUNTER — Ambulatory Visit (HOSPITAL_COMMUNITY)
Admission: RE | Admit: 2011-04-30 | Discharge: 2011-04-30 | Disposition: A | Discharge status: Home / Self Care | Payer: Medicare Other | Source: Ambulatory Visit | Attending: Orthopedic Surgery | Admitting: Orthopedic Surgery

## 2011-04-30 ENCOUNTER — Other Ambulatory Visit (HOSPITAL_COMMUNITY): Payer: Self-pay | Admitting: Orthopedic Surgery

## 2011-04-30 ENCOUNTER — Encounter (HOSPITAL_COMMUNITY)
Admission: RE | Admit: 2011-04-30 | Discharge: 2011-04-30 | Disposition: A | Discharge status: Home / Self Care | Payer: Medicare Other | Source: Ambulatory Visit | Attending: Orthopedic Surgery | Admitting: Orthopedic Surgery

## 2011-04-30 DIAGNOSIS — M545 Low back pain, unspecified: Secondary | ICD-10-CM | POA: Insufficient documentation

## 2011-04-30 DIAGNOSIS — Z8551 Personal history of malignant neoplasm of bladder: Secondary | ICD-10-CM | POA: Insufficient documentation

## 2011-04-30 DIAGNOSIS — M5380 Other specified dorsopathies, site unspecified: Secondary | ICD-10-CM | POA: Insufficient documentation

## 2011-04-30 DIAGNOSIS — M549 Dorsalgia, unspecified: Secondary | ICD-10-CM | POA: Insufficient documentation

## 2011-04-30 DIAGNOSIS — C679 Malignant neoplasm of bladder, unspecified: Secondary | ICD-10-CM | POA: Insufficient documentation

## 2011-04-30 DIAGNOSIS — M4120 Other idiopathic scoliosis, site unspecified: Secondary | ICD-10-CM | POA: Insufficient documentation

## 2011-04-30 MED ORDER — TECHNETIUM TC 99M MEDRONATE IV KIT
25.0000 | PACK | Freq: Once | INTRAVENOUS | Status: AC | PRN
  Administered 2011-04-30: 23.5 via INTRAVENOUS

## 2011-05-08 NOTE — Consult Note (Signed)
NAMESARI, COGAN NO.:  [id number]   MEDICAL RECORD NO.:  [id number]          PATIENT TYPE:  INP   LOCATION:  6524                         FACILITY:  [facility]   PHYSICIAN:  Petra Kuba, M.D.    DATE OF BIRTH:  Sep 09, 1926   DATE OF CONSULTATION:  10/08/2007  DATE OF DISCHARGE:                                 CONSULTATION   REFERRING PHYSICIAN:  Georga Hacking, M.D.   HISTORY:  The patient seen at the request of Dr. Donnie Aho with atypical  chest pain.  She has a long history of nervous stomach, who has had 3  days of increasing chest, jaw and arm pain, some is in her midepigastric  area.  She is on Prilosec twice a day she tells me.  Had a negative endo  in March but also takes a lot of Tums for upper GI symptoms.  She has  had ultrasounds of her gallbladder in the past although cannot remember  when and has had an upper GI at some point since she has been married.  Her catheter today was not impressive and Dr. Donnie Aho asked me to see her  for further GI workup and plans.  Her liver tests, amylase were also  normal.   PAST MEDICAL HISTORY:  1. Pertinent for left bundle branch block.  2. PVCs.  3. History of GERD and esophageal spasms in the past.  4. Hysterectomy.  5. Bladder cancer with an ileal loop.  6. Abdominal hernia repair.   SOCIAL HISTORY:  Pertinent for tobacco in the past, but no alcohol.   CURRENT MEDICATIONS:  1. Atenolol.  2. Aspirin.  3. Prilosec.  4. BuSpar.  5. She ill also use some Amitiza and aspirin.   ALLERGIES:  PERTINENT FOR SHE CARRIES A CARD THAT HAS 20 DIFFERENT  ALLERGIES WRITTEN ON IT.  INTERESTING ON HER TYPED UP LIST SHE CAN TAKE  FENTANYL AND VERSED WHICH SHE HAS GOTTEN BEFORE ON HER PROCEDURES SHE  BELIEVES. INTOLERANCES INCLUDE.  1. DILAUDID.  2. MORPHINE.  3. DEMEROL.  4. PENICILLIN.  5. NOVOCAIN.  6. IODINE.  7. ERYTHROMYCIN.  8. TETRACYCLINE.  9. SULFA.  10.CLINDAMYCIN.  11.TRAZODONE.  12.DESIPRAMINE.  13.URECHOLINE.  14.TYLENOL.  15.INDERAL.  16.RED DYE.  17.CELEBREX.  18.CODEINE.  19  SUGAR.  1. ADVIL.  2. RESTORIL.   FAMILY HISTORY:  Pertinent for a sister who died of complications of  colonoscopy with perforation and blood clot but she had lots of other  medical problems including some pancreatic issues.  Although no obvious  GI cancers.   REVIEW OF SYSTEMS:  Negative except as above.  She has no swallowing  problems.   PHYSICAL EXAMINATION:  VITAL SIGNS:  See chart.  No acute distress.  LUNGS: Pertinent for lungs are clear.  HEART:  Regular rate and rhythm.  ABDOMEN:  Soft, nontender.  CHEST:  No chest wall tenderness.   LABORATORY DATA:  See chart.  Pertinent for normal liver tests, amylase.   ASSESSMENT:  Atypical chest pain in a patient with a long history of  nervous stomach, negative workups in the past.  PLAN:  Agree with ultrasound and endoscopy.  Then could consider further  outpatient workup if her problems continued. Risks, benefits and methods  of endoscopy were discussed with the patient.  We will proceed at 11:30  tomorrow, try to get an ultrasound beforehand as you are doing.           ______________________________  Petra Kuba, M.D.     MEM/MEDQ  D:  10/08/2007  T:  10/09/2007  Job:  [id number]   cc:   Georga Hacking, M.D.

## 2011-05-08 NOTE — H&P (Signed)
[name], [name] NO.:  [id number]   MEDICAL RECORD NO.:  [id number]          PATIENT TYPE:  INP   LOCATION:  0104                         FACILITY:  [facility]   PHYSICIAN:  Ramiro Harvest, MD    DATE OF BIRTH:  December 07, 1926   DATE OF ADMISSION:  07/21/2008  DATE OF DISCHARGE:                              HISTORY & PHYSICAL   PRIMARY CARE PHYSICIAN:  Holley Bouche, M.D., of [facility].   UROLOGIST:  Courtney Paris, M.D.   HISTORY OF PRESENT ILLNESS:  [name] is a 77 year old white  female with a history of bladder cancer status post resection without  previous ileal loop and now with a stoma, hypertension, coronary artery  disease, hyperlipidemia who presents to the ED with a 2-day history of  chills, fever and feeling cold.  The patient denies any chest pain or  shortness of breath, no nausea or vomiting, no  abdominal pain, no  hematemesis, no melena, no hematochezia.  No back pain, no visual  changes, no asymmetric weakness or numbness.  No facial asymmetry.  No  focal neurological symptoms.  The patient does endorse a chronic cough  of about 6 weeks which is at times productive, no other associated  symptoms.  In the ED the patient was found to have a temperature of 102.  UA was positive for nitrites and leukocytes.  Chest x-ray was negative.  CBC with normal wbc, hemoglobin of 10.7, platelets of 152, hematocrit of  31.3 and a ANC of 8.2.  Chest x-ray was negative.  We were called to  admit the patient for further evaluation and management.   ALLERGIES:  DILAUDID, MORPHINE, DEMEROL, PENICILLIN, NOVOCAINE, IODINE,  ERYTHROMYCIN, TETRACYCLINE, SULFA, CLINDAMYCIN, DESIPRAMINE, URECHOLINE,  TYLENOL PM, INDERAL, CELEBREX, CODEINE, GLUCOSE, ADVIL, RESTORIL,  SCOPOLAMINE, PAXIL, CELEXA, VALIUM and FLOXACIN. The patient does,  however, state that she can take regular Tylenol for pain and for fever  as well.   PAST MEDICAL HISTORY:  1.  History of left bundle branch block.  2. History of supraventricular tachycardia and premature ventricular      contractions.  3. Status post total abdominal hysterectomy.  4. Bladder cancer status post resection with previous ileal loop now      of the stoma.  5. Gastroesophageal reflux disease.  6. Coronary artery disease with moderate coronary calcification and      moderate diffuse disease involving the LAD and origin of the      circumflex.  7. History of esophageal spasm.  8. Cervical spondylosis and cervical disk disease.  9. Right distal radius and ulna fracture secondary to a fall status      post ORIF July 06, 2008, per Dr. Melvyn Novas IV.  10.Osteoporosis.  11.Pseudoaneurysm of the right groin after cath in October 10, 2007.  12.Hypertension.  13.Hyperlipidemia.  14.Small duodenal C-loop diverticulum.  15.Abdominal hernia repair.   HOME MEDICATIONS:  1. Atenolol 25 mg p.o. b.i.d.  2. Prilosec 20 mg p.o. daily.  3. Crestor, dose unknown.  4. Norvasc, dose unknown. We will need to verify the patient's home      medications.  SOCIAL HISTORY:  The patient lives in [place], [place], with  her husband.  She is retired.  Remote history of tobacco use from ages  [age], none since then.  Denies any alcohol or illicit drug use.   FAMILY HISTORY:  The patient states mother was found dead at the bed at  age 5, presumably from an acute MI.  Father died at age 1 from  complications of lung cancer.  A sister, who also died secondary to  complications of a colonoscopy.  She has three children, two sons and  one daughter, all of whom are healthy.   REVIEW OF SYSTEMS:  As per HPI, otherwise complete review of systems is  negative.   PHYSICAL EXAMINATION:  VITAL SIGNS:  Temperature 102, blood pressure  153/60, pulse of 116, respiratory rate 26, sating 91% on room air.  GENERAL:  Patient in no apparent distress.  HEENT:  Normocephalic, atraumatic.  Pupils equal, round and  reactive to  light.  Extraocular movements intact.  Oropharynx is clear.  No lesions,  no exudates.  NECK:  Supple.  No lymphadenopathy.  RESPIRATORY:  Lungs are clear to auscultation bilaterally.  No wheezes,  no crackles.  CARDIOVASCULAR:  Regular rate and rhythm with a 3/6 systolic ejection  murmur.  ABDOMEN:  Soft, nontender, nondistended.  Positive bowel sounds.  Stoma  with good granulation tissue, no signs or symptoms of infection at stoma  site.  EXTREMITIES:  No clubbing, cyanosis or edema.  NEUROLOGICAL:  The patient is alert and oriented x3.  Cranial nerves II-  XII are grossly intact.  No focal deficits.   LABORATORY DATA:  CBC with white count 9.9, hemoglobin 10.7, hematocrit  31.3, MCV of 94.2, platelet count of 152, absolute neutrophil count is  8.2.  ISTAT-8 with sodium 135, potassium 3.8, chloride 100, glucose 126,  BUN 8, creatinine 0.7.  UA was yellow, cloudy, specific gravity 1.011,  pH of 7, glucose negative, bilirubin negative, ketones negative, blood  small, protein negative, urobilinogen 0.2, nitrite positive, leukocytes  large.  Microscopy with wbc 21-50, rbc 3-6, bacteria many.   Chest x-ray obtained, showed no acute findings.   ASSESSMENT AND PLAN:  [name] is a 77 year old female with  history of bladder cancer status post resection, history of  hypertension, hyperlipidemia, coronary artery disease who presents to  the emergency department with a 2-day history of chills and fever.  1. Complicated urinary tract infection.  Patient with a history of a      bladder cancer status post resection.  We will culture her urine.      The patient does have multiple allergies.  We will treat with      intravenous cefepime until culture results return and will follow.  2. Fever, likely secondary to problem #1.  We will check a urine      culture.  We will also check a sputum Gram stain and culture and      repeat a chest x-ray in the morning after  hydration.  3. Hypertension.  Continue home dose atenolol and Norvasc.  4. Anemia.  Last hemoglobin A1c was 10.8 on May 27, 2008.  We will      check an anemia panel and will follow.  5. Coronary artery disease, stable.  Continue home dose beta blocker      and aspirin and statin.  6. Gastroesophageal reflux disease with esophageal spasms.  Protonix.  7. History of supraventricular tachycardia with premature ventricular  complexes.  8. Left bundle branch block.  9. Bladder cancer status post resection.  10.Status post total abdominal hysterectomy.  11.Right distal radius and ulna fracture status post open reduction      and internal fixation July 06, 2008.  12.Osteoporosis.  13.Hyperlipidemia.  Continue home dose Crestor.  14.Prophylaxis.  Protonix for gastrointestinal prophylaxis.  Lovenox      for deep venous thrombosis prophylaxis.   It has been a pleasure taking care of [name].      Ramiro Harvest, MD  Electronically Signed     DT/MEDQ  D:  07/21/2008  T:  07/21/2008  Job:  [id number]   cc:   Holley Bouche, M.D.  Fax: [phone]   Courtney Paris, M.D.  Fax: [phone]

## 2011-05-08 NOTE — Procedures (Signed)
CAROTID DUPLEX EXAM   INDICATION:  Follow up carotid artery disease.   HISTORY:  Diabetes:  No.  Cardiac:  No.  Hypertension:  No.  Smoking:  Previous.  Previous Surgery:  No.  CV History:  Asymptomatic.  Amaurosis Fugax No, Paresthesias No, Hemiparesis No.                                       RIGHT             LEFT  Brachial systolic pressure:         140               144  Brachial Doppler waveforms:         WNL               WNL  Vertebral direction of flow:        Antegrade         Antegrade  DUPLEX VELOCITIES (cm/sec)  CCA peak systolic                   88                81  ECA peak systolic                   131               103  ICA peak systolic                   106               162  ICA end diastolic                   34                39  PLAQUE MORPHOLOGY:                  Calcified         Calcified  PLAQUE AMOUNT:                      Mild              Mild/moderate  PLAQUE LOCATION:                    ICA/bifurcation   ICA/bifurcation   IMPRESSION:  1. Right internal carotid artery shows evidence of 20-39% stenosis      (high end of range).  2. Left internal carotid artery shows evidence of 40-59% stenosis.        ___________________________________________  Quita Skye Hart [place], M.D.   AS/MEDQ  D:  09/27/2009  T:  09/28/2009  Job:  [id number]

## 2011-05-08 NOTE — Consult Note (Signed)
NEW PATIENT CONSULTATION   [name], [name]  DOB:  [birth year]                                       11/28/2010  BMWUX#:[id number]   The patient presents today for evaluation of extracranial  cerebrovascular occlusive disease.  She is a very pleasant 77 year old  female who has known prior noninvasive findings of eft greater than  right carotid stenoses.  She specifically denies any prior amaurosis  fugax, transient ischemic attack or stroke.  She had an initial  ultrasound in our office in October of 2010 and subsequent follow-up at  [facility] November 2011.   PAST MEDICAL HISTORY:  Negative for cardiac disease.  She does have a  prior history of a hysterectomy, appendectomy, bladder surgery and  hernia surgery.   SOCIAL HISTORY:  She is married.  She does not smoke, quit remotely in  the past, and does not have excessive alcohol consumption.   FAMILY HISTORY:  Negative premature atherosclerotic disease.   She does walk for exercise.  I do have medical records provided by Dr.  Clelia Croft with his annual physical exam on November 20, 2010, and reviewed  this as well.   She does have multiple medication allergies, and these are documented in  the chart in our office.   PHYSICAL EXAMINATION:  A well-developed, well-nourished white female  appearing stated age, in no acute stress.  Blood pressure is 123/68 in  her right arm and 128/71 in her left arm.  Heart rate 70, O2 saturations  are 98%.  She is in no acute distress.  HEENT is normal.  I do not  appreciate carotid bruits bilaterally.  Her chest clear bilaterally.  Heart:  Regular rate without murmur.  Abdomen:  Soft, nontender.  No  masses noted.  Musculoskeletal:  No major deformities or cyanosis.  Neurologic:  No focal weakness or paresthesias.  Skin:  Without ulcers  or rashes.   I did review her duplex with the patient and her husband.  I explained  the difference in the velocity criteria in the 2  different noninvasive  vascular labs and explained that her velocities are nearly identical to  the studies in our office 1 year ago.  I explained that our  interpretation would be in the 40%-59% stenosis range on the left and 1%-  39% stenosis range on the right carotid system.  I have recommended that  she have a follow-up venous duplex, and we have scheduled this for 6  months.  She will notify us immediately should she develop any  neurologic deficits.     Larina Earthly, M.D.  Electronically Signed   TFE/MEDQ  D:  11/28/2010  T:  11/29/2010  Job:  [id number]   cc:   Kari Baars, M.D.

## 2011-05-08 NOTE — Op Note (Signed)
[name], [name]             ACCOUNT NO.:  [id number]   MEDICAL RECORD NO.:  [id number]          PATIENT TYPE:  INP   LOCATION:  6524                         FACILITY:  [facility]   PHYSICIAN:  Petra Kuba, M.D.    DATE OF BIRTH:  [birth year]   DATE OF PROCEDURE:  DATE OF DISCHARGE:  10/09/2007                               OPERATIVE REPORT   PROCEDURE:  Esophagogastroduodenoscopy with biopsy.   INDICATIONS:  Atypical chest pain.  Consent was signed after risks,  benefits, methods, options thoroughly discussed yesterday in the  hospital.   MEDICINES USED:  Fentanyl 40 mcg, Versed 4 mg.   PROCEDURE:  The video endoscope was inserted by direct vision.  The  esophagus was normal.  I doubt she had a tiny hiatal hernia.  Scope  passed in the stomach, advanced through a normal antrum, normal pylorus  into a normal duodenal bulb, around the C-loop where a small  diverticulum was seen into a normal second portion of the duodenum.  Scope was slowly withdrawn back to the bulb.  The bulb was normal.  The  diverticulum was confirmed.  The scope was withdrawn back to the stomach  and retroflexed.  Cardia, fundus, angularis, lesser and greater curve  were normal on retroflex visualization.  Straight visualization of the  stomach did not reveal any additional findings.  Air was suctioned,  scope slowly withdrawn again.  I had good look at the esophagus and it  was normal.  We went ahead and readvanced back into the stomach and  again on slow withdrawal a few distal esophageal biopsies were obtained  to rule out any microscopic reflux, __________ , esophagitis, etc.  The  scope was then removed.  The patient tolerated the procedure well.  There was no obvious immediate complication.   ENDOSCOPIC DIAGNOSES:  1. Doubtful tiny hiatal hernia, status post esophageal biopsies to      rule out microscopic abnormality.  2. Small duodenal C-loop diverticulum.  3. Otherwise normal  esophagogastroduodenoscopy.   PLAN:  Await pathology.  Happy to see back in 1-2 months if still having  problems or sooner p.r.n., otherwise we will touch base with her with  her biopsies.  Continue b.i.d. Prilosec and call me sooner p.r.n.           ______________________________  Petra Kuba, M.D.     MEM/MEDQ  D:  10/09/2007  T:  10/10/2007  Job:  [id number]   cc:   Georga Hacking, M.D.

## 2011-05-08 NOTE — Consult Note (Signed)
[name], [name]             ACCOUNT NO.:  [id number]   MEDICAL RECORD NO.:  [id number]          PATIENT TYPE:  INP   LOCATION:  1444                         FACILITY:  [facility]   PHYSICIAN:  Courtney Paris, M.D.DATE OF BIRTH:  08/27/1926   DATE OF CONSULTATION:  07/21/2008  DATE OF DISCHARGE:                                 CONSULTATION   REQUESTED BY:  Dr. Ramiro Harvest.   REASON FOR CONSULTATION:  Possible urosepsis.   BRIEF HISTORY:  This 77 year old lady has been followed by me for some  time.  She comes in with a 2-day history of some first left-sided flank  pain - then right, and then with some chills and fever last night and  was brought to the hospital by ambulance.  She has been little  disoriented since she has been here, but her temperature has come down.  The workup including chest x-ray and other evaluation does not reveal  any other source of infection except possibly from her urinary tract.  However, urine was gotten from her stoma bag which would be not very  helpful, but the only thing that looked like it might be causing a  problem.   She does have a past history of a T2 bladder cancer which was removed by  radical cystectomy at [facility] in [facility] October 2005.  She had some problems with the right ureter with obstruction and had a  nephrostomy tube for about 3 months postoperatively but has been normal  since then.  She had CT scans in December 2006, November 2007, December  2008, and most recently May of 2009.  She has had little peristomal  hernia, but this has been stable.  The last CT scan in May showed normal  kidneys and no evidence for metastatic disease.  Lungs were normal at  that time as well.  She does occasionally have some sharp pain lasting a  minute or so in the right lower quadrant, but these have not been a  problem recently.  She fell and broke her  right wrist about 6 weeks  ago, just got out of the cast into a  splint 2 days ago and began having  chills and fever last night.  She denies any chest pain or shortness of  breath.  No cough.  No nausea or vomiting.  No abdominal pain.  Bowels  are normal.  CBC was normal.  Hemoglobin was 10.7, hematocrit of 31.5.  Urinalysis was positive for nitrites and leukocytes but again obtained  from the stoma bag.   Other problems include osteoporosis, a pseudoaneurysm of the right groin  after a catheterization October 2008, hypertension, hyperlipidemia and  history of GERD.  She has coronary artery disease with moderate coronary  calcification and diffuse disease involving the LAD and origin of the  circumflex artery.   MEDICATIONS:  On admission included:  1. Atenolol.  2. Prilosec.  3. Crestor.  4. Norvasc.   SHE DOES HAVE ALLERGIES TO NUMEROUS MEDICATIONS INCLUDING ADVIL,  ANTIHISTAMINES, CELEBREX, CLINDAMYCIN, DEMEROL, DILAUDID, ELAVIL,  ERYTHROMYCIN, INDERAL, FLOXIN, MORPHINE DERIVATIVE, PAXIL, PENICILLIN,  SULFA,  TETRACYCLINE, TRAZODONE AND VALIUM.   Her 12 point review of systems is otherwise negative except as above.  She does have history of cervical spondylosis and history of heartburn  and some hypoglycemia, also rectocele in the past.   FAMILY HISTORY AND SOCIAL HISTORY:  She is married to [employer].  No other pertinent findings.   VITAL SIGNS:  Blood pressure is 128/48, her pulse is 126, her  temperature is 99.9.  She is a pleasant, alert, white female but is confused as to place and  time but does know me.  HEENT:  Is clear.  NECK:  Is supple.  ABDOMEN:  Soft, benign.  She has an ileal conduit in the right upper  abdomen that seems to be healthy and draining clear urine.  There is a  little bit of mucous around this.  She does have a little bit of right  CVA tenderness.  Scars in her abdomen from previous surgery are noted.  No CVA pain on the left.  PELVIC:  Deferred.  EXTREMITIES:  Negative.  No edema.  Good distal  pulses.  Intact  sensation to light touch.   IMPRESSION:  1. Probable right pyelonephritis.  2. Previous T2 bladder cancer post cystectomy October 2005 with no      active disease.  3. Right peristomal hernia around her ileal conduit.  4. Recent confusion, possibly metabolic syndrome.  A head CT was      apparently done and reported as negative for stroke.   RECOMMENDATIONS:  Continue the cefepime as her temperature seems be  coming down, her white count is normal and just treat her expectantly.  I think a renal ultrasound to rule out hydronephrosis will be in order.  I will leave the record of the CT scan done in May on the chart.      Courtney Paris, M.D.  Electronically Signed     HMK/MEDQ  D:  07/21/2008  T:  07/21/2008  Job:  [id number]

## 2011-05-08 NOTE — H&P (Signed)
NAMECHANCI, OJALA             ACCOUNT NO.:  [id number]   MEDICAL RECORD NO.:  [id number]          PATIENT TYPE:  INP   LOCATION:  6524                         FACILITY:  [facility]   PHYSICIAN:  Georga Hacking, M.D.DATE OF BIRTH:  06/19/1926   DATE OF ADMISSION:  10/07/2007  DATE OF DISCHARGE:                              HISTORY & PHYSICAL   CHIEF COMPLAINT:  Chest pain.   HISTORY OF PRESENT ILLNESS:  The patient is a 77 year old white female  with past medical history notable for hypertension; who presents to the  Emergency Department for further evaluation of chest pain.  The patient  states that she has been experiencing symptoms over the last week.  She  noted on Sunday while at church she had the onset of bilateral jaw pain.  She has been seen since then by multiple physicians, including the  cardiologist who felt that she required further risk stratification,  with possibly a catheterization.  She presents to the Emergency  Department this evening with 10/10 sharp, substernal chest pain.  She  denies any radiation to the left arm, neck or jaw or back.  There was no  associated nausea, vomiting, diaphoresis, shortness of breath or dyspnea  on exertion.  She denies any orthopnea, PND, palpitations, presyncope or  syncope.  She states the pain was distinctly different from that she was  experiencing over the last week, and this is what  prompted her visiting  the emergency department.   On presentation, an EKG was obtained which revealed a left bundle branch  block.  The patient confirms that she has had a long history of a left  bundle block.  Her point-of-care biomarkers were negative x3.  She  states that the pain spontaneously resolved on its own after  approximately 30 minutes.  Since being in the Emergency Department, she  has had no additional episodes.  Otherwise she is without complaints.   PAST MEDICAL HISTORY:  1. Left bundle branch block.  2. History of  supraventricular tachycardia and premature ventricular      contractions.  3. Status post total abdominal hysterectomy.  4. Gastroesophageal reflux disease.  5. Bladder cancer, status post resection.  6. The patient states that she has had 3 normal heart catheterizations      in the past.   ALLERGIES:  The patient has an extremely long list of medications she is  allergic to:   DILAUDID, MORPHINE, DEMEROL, PENICILLIN, NOVOCAINE, IODINE,  ERYTHROMYCIN, TETRACYCLINE, SULFA, CLINDAMYCIN TRAZODONE, DESIPRAMINE,  URECHOLINE, TYLENOL PM, ENDERAL, CELEBREX, CODEINE, GLUCOSE, ADVIL,  RESTORIL, SCOPOLAMINE, PAXIL, CELEXA, VALIUM, and OFLOXACIN.   CURRENT MEDICATIONS:  1. Atenolol 25 mg twice daily.  2. BuSpar 7.5 mg b.i.d.  3. Aspirin 81 mg daily.  4. Prilosec 20 mg daily.   SOCIAL HISTORY:  The patient lives in [place] with her second  husband.  She has long since been retired.  She has a very remote  history of smoking from the age of [age] and none since then.  She  denies any alcohol or illicit substance.   FAMILY HISTORY:  The patient states her mother  was found dead in the bed  at the age of 7, presumably from a myocardial infarction.  Her father  died at the age of 53 from complications of lung cancer.  She has a  sister who recently died, secondary to complications of a colonoscopy.   REVIEW OF SYSTEMS:  As per HPI, otherwise complete review of systems  negative.   PHYSICAL EXAMINATION:  Blood pressure 116/62, pulse 74, O2 saturations  100% on room air.  GENERAL:  The patient is alert and oriented x3; in no acute distress;  pleasantly and conversive.  HEENT: Normocephalic, atraumatic, EOMI, PERRL, nares patent, OMP is  clear without erythema or exudate.  NECK: Supple, full range of motion, no appreciable JVD.  There is no  palpable thyromegaly or lymphadenopathy.  CARDIOVASCULAR:  Examination reveals a normal S1-S2 with a 2/6 systolic  murmur at the upper left sternal  border, which radiates to the bilateral  carotids and consistent with aortic stenosis.  Her PMI is laterally  displaced in the left midclavicular/mid-axillary line.  Her peripheral  pulses are 2+ and symmetric bilaterally.  LUNGS:  Clear to auscultation bilaterally.  SKIN:  Reveals no rash or lesion.  ABDOMEN:  Soft, nontender and nondistended.  Positive bowel sounds.  No  hepatosplenomegaly.  GU: Normal female genitalia.  EXTREMITIES:  No clubbing, cyanosis or edema.  There are no rashes or  lesions noted.  MUSCULOSKELETAL:  No joint deformity or effusions.  NEUROLOGIC:  Grossly nonfocal.  Strength and sensation are grossly  intact throughout.   RADIOLOGY:  No acute cardiopulmonary process.  EKG reveals normal sinus  rhythm with a left bundle branch block.   LABS:  Cardiac biomarkers point-of-care are negative x3.  White blood  cell count 5.8, hemoglobin 11.5, hematocrit 34.2, platelet count 191.  Sodium 140, potassium 4.4, chloride 104, CO2 30, BUN 13, creatinine  0.71, glucose 139, calcium 9.7, lipase 27.   IMPRESSION:  1. Chest pain rule out myocardial infarction.  2. Left bundle branch block.  3. Hypertension.  4. Gastroesophageal reflux disease.  5. History of bladder cancer.   PLAN:  We will admit the patient to a telemetry bed for further  evaluation and monitoring.  We will cycle cardiac biomarkers x3 to rule  out myocardial infarction; although my suspicion for acute coronary  syndrome is very low.  The patient has 2 significant risk factors in her  family history and hypertension.  However, at her age, she is already  exceeded her family history.  The patient states that upon her recent  discussion with her cardiologist, a cardiac catheterization was planned.  It should be noted that the patient has an IODINE ALLERGY, and states  that she has anaphylaxis to this agent.  Given the low acuity of her  presentation, I will defer initiating a contrast allergy  prophylaxis  regimen until this procedure is discussed with her primary cardiologist.  We will continue her aspirin 325 mg daily.  I do not feel that there is  any indication for full anticoagulation with heparin agent at this time.  The  patient does have a murmur on examination, that would be consistent with  aortic stenosis.  We will check a transthoracic echocardiogram to assess  her left ventricular function and structure in the morning; as well as  her valvular status.      Audery Amel, MD   Electronically Signed     ______________________________  Lacretia Nicks. Viann Fish, M.D.    Maylon Cos  D:  10/08/2007  T:  10/08/2007  Job:  [id number]

## 2011-05-08 NOTE — Discharge Summary (Signed)
[name], [name]             ACCOUNT NO.:  [id number]   MEDICAL RECORD NO.:  [id number]          PATIENT TYPE:  INP   LOCATION:  6524                         FACILITY:  [facility]   PHYSICIAN:  Georga Hacking, M.D.DATE OF BIRTH:  1926/07/21   DATE OF ADMISSION:  10/07/2007  DATE OF DISCHARGE:  10/09/2007                               DISCHARGE SUMMARY   FINAL DIAGNOSES:  1. Chest and abdominal pain of uncertain etiology - myocardial      infarction ruled out.      a.     Results of endoscopy pending.      b.     Negative gallbladder ultrasound.  2. Coronary artery disease, moderate with coronary calcification and      moderate diffuse disease involving LAD (left anterior descending),      origin of circumflex.  3. History of bladder cancer was previous ileal loop.  4. Mild hyperlipidemia.  5. Multiple allergies and drug intolerances.  6. History of esophageal spasm.  7. Cervical spondylosis and cervical disk disease.  8. Left bundle branch block, chronic.  9. History of PVCs (premature ventricular contractions).   CONSULTATIONS:  Dr. Ewing Schlein.   PROCEDURES:  Cardiac catheterization, MRI of neck, abdominal ultrasound,  endoscopy.   HISTORY:  A 77 year old female who has a complex past history.  She has  had hypertension previously and some arrhythmias in the past treated  with atenolol.  She had a 1 week history of differential arm symptoms.  She had some jaw pain and upper anterior chest pain.  She then  complained of burning paresthesias in her arm and then later had a  severe burning pain in her mid abdomen which was different than the  previous pains.  She had been seen by two cardiologists previous to  seeing me on Monday in the office and we were in the process of  obtaining records but she then developed this burning pain in her mid  abdomen and came to the emergency room where she was admitted.  She has  a known left bundle branch block and we decided to proceed with  cardiac  catheterization after admission and she was ruled out.  Please see the  previously dictated history and physical for remainder of the details.   HOSPITAL COURSE:  Laboratory data on admission showed a hemoglobin of  11.5, hematocrit 34.2, white count of 5800.  Chemistry panel shows  sodium of 140, potassium 4.4, chloride 104, CO2 30, BUN 13, creatinine  0.71, glucose 139.  PT/INR was unremarkable.  Liver enzymes were normal.  Cardiac enzymes showed negative CPK and MV.  Cholesterol was 177 with an  LDL of 89, HDL of 58 and triglycerides of 151.  D-dimer was 0.22.  Chest  x-ray showed cardiomegaly, hyperinflation but otherwise no acute  disease.  EKG showed left bundle branch block.   The patient was admitted for observation overnight.  Serial enzymes were  negative.  Her pain resolved and it was elected to take her to the  catheterization laboratory for further assessment.  Her left ventricular  function was normal.  There was  coronary calcification identified as  well as calcification in the aorta consistent with atherosclerosis.  She  had diffuse narrowing of the LAD estimated at moderate around 40% in the  proximal portion.  There was ostial narrowing of the circumflex and  there were scattered irregularities elsewhere.  It was not felt that her  disease could be explained by ischemia at this time.  Because of her  neck symptoms, she previously had had an MRI of her cervical spine that  showed a previous fusion at C5, she had disk disease with partial  herniation at C7 as well as foraminal narrowing and significant  spondylosis.  A gallbladder ultrasound showed no gallstones.  She was  seen in consultation by Dr. Vida Rigger who felt that she needed to have  endoscopy and this was to be done prior to discharge and at the time of  dictation is still pending.  She had a late re-bleed in her groin post  cath at 9:30 p.m. and had additional pressure held.  It was again held  at  midnight.  She was checked the next morning.  She had no bruit, a  slight knot and had significant ecchymosis noted.  She was seen the next  morning and was free of pain.  I elected to add amlodipine to her  regimen thinking this would cover both the esophageal spasm, treat some  hypertension seen at catheterization.  She also will have Crestor 10 mg  added for treatment of hyperlipidemia in light of atherosclerosis.  She  is discharged at this time in improved condition.   DISCHARGE MEDICATIONS:  1. Include atenolol 50 mg daily.  2. BuSpar 7.5 mg twice daily.  3. Aspirin 81 mg daily.  4. Amitiza 24 mg one to two times daily.  5. Prilosec 20 mg daily.  6. New medications include Crestor 10 mg daily.  7. Amlodipine 5 mg daily.   She was given instructions concerning her groin check and is also to  have further instructions and followup by Dr. Ewing Schlein.  The results of the  endoscopy are pending at the time of discharge.  She will see me in  followup in 1 week.  She continues to complain of insomnia and this will  be addressed by her family physician.      Georga Hacking, M.D.  Electronically Signed     WST/MEDQ  D:  10/09/2007  T:  10/10/2007  Job:  [id number]   cc:   Petra Kuba, M.D.  [facility]

## 2011-05-08 NOTE — Cardiovascular Report (Signed)
[name], [name]             ACCOUNT NO.:  [id number]   MEDICAL RECORD NO.:  [id number]          PATIENT TYPE:  INP   LOCATION:  6524                         FACILITY:  [facility]   PHYSICIAN:  Georga Hacking, M.D.DATE OF BIRTH:  01/17/1926   DATE OF PROCEDURE:  DATE OF DISCHARGE:                            CARDIAC CATHETERIZATION   PROCEDURE:  Left heart catheterization with coronary angiograms and left  ventriculogram.   HISTORY:  The patient is a 77 year old female who has had recurrent arm  and chest discomfort as well as a burning-type discomfort for several  days.  She also has had significant difficulty with insomnia.  She  presented with severe burning mid sternal chest discomfort and has a  known left bundle branch block.  History is extraordinarily difficult  and catheterization is advised to assess for coronary disease.  Evidently has a history of catheterization several years ago.   PROCEDURE:  The patient was premedicated with steroids, Benadryl and  Pepcid IV because of a possible history of contrast allergy, although  this is suspicious that she may not have in the past.  She tolerated the  procedure well without any complications.  The right femoral artery was  entered using a single anterior needle wall stick.  She was returned to  the holding area for sheath removal following the procedure.   HEMODYNAMIC DATA:  Aorta postcontrast 156/60, LV postcontrast 162/6-10.   ANGIOGRAPHIC DATA:  Left ventriculogram:  Performed in the 30 degree RAO  projection.  The aortic valve was normal.  The mitral valve was normal.  Left ventricle appears normal in size.  Estimated  ejection fraction is  70%.  Coronary arteries arise and distribute normally.  There is  calcification noted involving the proximal LAD, circumflex and proximal  right coronary artery.  Left main coronary is normal.  The left anterior  descending is calcified proximally.  There is diffuse narrowing  proximally estimated at least 40% throughout the proximal LAD, but no  focal obstructive narrowings are appreciated.  The vessel is large and  extends to the apex.  The circumflex has a large intermediate branch  that contains scattered irregularities.  The continuation branch of the  circumflex has calcification and 40% ostial narrowing.  The right  coronary artery is calcified proximally and contains scattered  irregularities, and is a dominant vessel.   IMPRESSION:  1. Moderate coronary artery disease with calcification involving all 3      vessels.  Moderate diffuse stenosis involving the proximal left      anterior descending, ostium of the circumflex and irregularities      elsewhere.  2. Normal left ventricular function.   RECOMMENDATIONS:  Her blood pressure was elevated and it is suspected  that the symptoms she had were noncardiac.  She will be treated with  dihydropyridine calcium blockers and also  be treated with lipid  lowering therapy.  She will have further workup for other sources of  pain.      Georga Hacking, M.D.  Electronically Signed     WST/MEDQ  D:  10/08/2007  T:  10/09/2007  Job:  [id number]   cc:   Dario Guardian, M.D.

## 2011-05-08 NOTE — Op Note (Signed)
NAMESALIMATOU, SIMONE NO.:  [id number]   MEDICAL RECORD NO.:  [id number]          PATIENT TYPE:  INP   LOCATION:  5013                         FACILITY:  [facility]   PHYSICIAN:  Madelynn Done, MD  DATE OF BIRTH:  Jul 26, 1926   DATE OF PROCEDURE:  06/06/2008  DATE OF DISCHARGE:                               OPERATIVE REPORT   PREOPERATIVE DIAGNOSES:  1. Right comminuted distal radius fracture, intra-articular with      greater than four more pieces.  2. Right distal ulnar fracture, ulnar head fracture.  3. Osteoporosis.   POSTOPERATIVE DIAGNOSIS:  1. Right comminuted distal radius fracture, intra-articular with      greater than four more pieces.  2. Right distal ulnar fracture, ulnar head fracture.  3. Osteoporosis.   ATTENDING SURGEON:  Madelynn Done, MD, who was scrubbed and present  for the entire procedure.   ASSISTANT SURGEON:  Marlowe Kays, MD, who was scrubbed and present  for the key portion of the procedure to include assistance and aide in  reduction as well as application of the plate.   SURGICAL PROCEDURES:  1. Open treatment of right distal radius fracture for more intra-      articular fracture of four more fragments with internal fixation.  2. Brachioradialis tenotomy.  3. Closed treatment of right distal ulnar fracture.  4. Radiograph, three-views, right wrist.  Stress radiography.   ANESTHESIA:  General via endotracheal tube.   TOURNIQUET TIME:  Less than 1 hour and 6 minutes at 250 mmHg.   SURGICAL IMPLANTS:  1. [employer] volar distal radius plate with locking pegs      distally and 3 bicortical screws proximally.  2. Several mL of OrthoBlast bone putty.   INTRAOPERATIVE FINDINGS:  The patient did have a comminuted distal  radius fracture with fracture lines extending into the articular segment  as well as the metaphyseal-diaphyseal comminution.  The patient had a  void over the metaphyseal-diaphyseal junction.   Therefore, the bone  graft was used to supplement in this void.   The patient's fracture was irreducible.  The FPL was inside the fracture  site as well as a large portion of the pronator quadratus.   SURGICAL INDICATIONS:  Mrs. Yost is a 77 year old right-hand dominant  female who sustained an injury to her right wrist late last evening.  She was then closed reduced in the emergency department by Dr. Chilton Greathouse and this reduction did not result in a complete restoration of  her anatomical alignment.  Therefore, the patient was consented to  proceed to the operating room today for the above procedure.  Risks,  benefits, and alternatives were discussed in detail with the patient and  a signed informed consent was obtained.   DESCRIPTION OF PROCEDURE:  The patient was properly identified in the  preoperative holding area and a mark with permanent marker was made on  the right hand to indicate correct operative site.  The patient was  brought back to the operating room and placed supine on the anesthesia  room table where general anesthesia was administered  via endotracheal  tube.  The patient received preoperative vancomycin prior to any skin  incision.  A well-padded tourniquet was then placed on the right  brachium and sealed with a 1000 drape.  The right upper extremity was  then prepped with Hibiclens and sterilely draped.  The patient did have  a small puncture wound directly over the volar aspect of the wrist with  invagination of the skin.  Skin incisions were then marked out  longitudinally.  After time-out was called, the correct side was  identified and the procedure was then begun.  The limb was then elevated  using Esmarch exsanguination and the tourniquet insufflated to 250 mmHg.  Dissection was carried down through the skin only where the FCR sheath  was then identified and then longitudinally incised and released both  proximally and distally.  This expose the FPL  which was entrapped into  the fracture fragment as well there was very little remaining of the  pronator quadratus.  Blunt dissection was then carried out to visualize  the fracture fragments.  After the fracture site was adequately exposed,  the fracture hematoma was then thoroughly irrigated and evacuated.  The  metaphyseal-diaphyseal comminuted pieces and capsular soft tissue  attachments were left in place.  There was a large metaphyseal void  which was filled with the bone graft substitute, the OrthoBlast.  This  filled in nicely and the comminuted fracture fragments were then placed  back down and an open reduction was then performed.  The [employer] volar distal radius plate was then applied and held  temporarily in place with K-wires.  Its position then confirmed using  the mini C-arm and felt to be in good position.  After adequate  placement of the plate using the oblong hole, a 3.5-mm screw was then  placed after the appropriate drill and depth gauge measurement.  Attention was then turned distally where the ulnar column was then fixed  with a 2.0-mm drill bit and locking pegs were then placed in ulnar-to-  radial direction.  All distal pegs were then placed with smooth pegs.  After distal fixation had been achieved, attention was then turned  proximally where the two more 3.5-mm bicortical screws were then placed.  There was good fixation just at the edge of the fracture site with good  cortices proximally.  After placement of the internal fixation, the  wound was then thoroughly irrigated.  Final images using the mini C-arm  and stress radiography was then carried out.  Of note, the patient's  brachioradialis was released.  It was needed in order to obtain exposure  as well as to reduce the intra-articular fragments along the radial  styloid as well as extending into the radiocarpal joint.   After open treatment of the intra-articular fracture and final imaging,  the  tourniquet was then deflated.  Hemostasis was obtained with bipolar  cautery.  The pronator quadratus was not able to be repaired.  The  subcutaneous tissues were closed with 3-0 Vicryl and 4-0 Monocryl  suture.  The skin was then closed with 4-0 Prolene horizontal mattress  running suture.  A #7 TLS drain was placed prior to closure.  10 mL of  0.5% Marcaine were infiltrated into the drain in order to irrigate the  area prior to skin closure.  The wound was then irrigated at all levels.  Adaptic dressing and sterile compressive dressing was then applied.  The  patient was then placed in a well-molded  sugar-tong splint.  After  fixation of the radius, the ulna was then assessed and there was good  anatomical alignment of the ulna after fixation of the radius.  Therefore, we continued with a closed treatment of the ulnar head  fracture.  The patient was then placed in a well-padded sugar-tong  splint, extubated, and taken to recovery room in good condition.   INTRAOPERATIVE RADIOGRAPHS:  Three views of the wrist did show the  internal fixation in place.  There was a good restoration of the  anatomical alignment and good placement of the volar fixation.   POSTOPERATIVE PLAN:  The patient will be admitted overnight for IV  antibiotics and pain medication.  She will be seen back in the office in  10-14 days for wound check and suture removal 3 weeks in long-arm  immobilization and 3 weeks in short-arm immobilization.  X-rays at a 10-  day, then at the 3-week mark, and then at the 6-week mark.      Madelynn Done, MD  Electronically Signed     FWO/MEDQ  D:  06/06/2008  T:  06/07/2008  Job:  [id number]

## 2011-05-08 NOTE — Discharge Summary (Signed)
NAMEMALAVIKA, [name] NO.:  [id number]   MEDICAL RECORD NO.:  [id number]          PATIENT TYPE:  INP   LOCATION:  1444                         FACILITY:  [facility]   PHYSICIAN:  Ramiro Harvest, MD    DATE OF BIRTH:  21-Mar-1926   DATE OF ADMISSION:  07/21/2008  DATE OF DISCHARGE:  07/26/2008                               DISCHARGE SUMMARY   ATTENDING PHYSICIAN:  Dr. Ramiro Harvest.   PRIMARY CARE PHYSICIAN:  Dr. Holley Bouche of [facility].   PATIENT'S UROLOGIST:  Dr. Orvan Falconer.   PATIENT'S PULMONOLOGIST:  Dr. Sherene Sires of [facility]   DISCHARGE DIAGNOSIS:  1. Pyelonephritis.  2. Altered mental status/metabolic encephalopathy secondary to      pyelonephritis.  3. Chronic cough.  4. Diarrhea.  5. Hypokalemia.  6. Iron-deficiency anemia.  7. Previous T2 bladder cancer status post cystectomy 09/2004 with no      active disease.  8. Right stomal hernia around ileal conduit.  9. History of left bundle branch block.  10.History of superior supraventricular tachycardia and premature      ventricular contractions.  11.Status post total abdominal hysterectomy.  12.Gastroesophageal reflux disease.  13.Coronary artery disease with moderate coronary calcification and      moderate diffuse disease involving the LAD and origin of the      circumflex.  14.History of esophageal spasm.  15.Cervical spondylosis and cervical disk disease.  16.Right distal radius and ulna fractures secondary to fall status      post ORIF 07/06/2008 per Dr. Melvyn Novas, IV.  17.Osteoporosis.  18.Pseudoaneurysm of the right groin after cath in 10/10/2007.  19.Hypertension.  20.Hyperlipidemia.  21.Small duodenal C-loop diverticulum.  22.Abdominal hernia repair.   DISCHARGE MEDICATIONS:  1. Atenolol 25 mg p.o. b.i.d.  2. Ceftin 500 mg p.o. b.i.d. x10 days.  3. Prilosec 20 mg p.o. b.i.d.  4. Amlodipine 5 mg p.o. daily.  5. Crestor 10 mg p.o. every-other-day.  6. Colace 100 mg  p.o. every night - the patient is to hold Colace      until diarrhea has totally resolved.   DISPOSITION/FOLLOWUP:  The patient will be discharged home.  The patient  is to call to schedule a followup appointment with PCP Dr. Holley Bouche in two weeks and followup of basic metabolic profile needs to be  checked to follow up on the patient's renal function and electrolytes.  A repeat urinalysis will be needed for resolution of the patient's  UTI/pyelonephritis and also to follow up on the patient's  pyelonephritis.  The patient's blood pressure also needs to be  reassessed.  The patient is to follow up with Dr. Aldean Ast in the next  three to four weeks.  The patient is also scheduled to follow up with  Dr. Sherene Sires on August 09, 2008, at 11:15 a.m., for further evaluation of  chronic cough and follow up with her PCP.  The patient's anemia needs to  be reassessed and may consider starting the patient on iron pills.  If  the patient has not had a recent colonoscopy will likely need one and  may need further evaluation per gastroenterology.  The patient's primary  care physician will need to check a CBC to follow up on the patient's  hemoglobin.   CONSULTATIONS DONE:  The patient was seen in consultation by Dr.  Aldean Ast on July 21, 2008, per family request.   PROCEDURES PERFORMED:  1. A chest x-ray was performed on July 21, 2008, that showed no acute      findings.  2. A head CT was performed on July 21, 2008, which was negative for      bleed.  No other acute intracranial process.  3. Repeat chest x-ray was done on July 05, 2008, that showed a very      small right pleural effusion, otherwise negative.  4. Renal ultrasound was done on July 05, 2008, that showed no acute      findings, negative for hydronephrosis.   BRIEF HOSPITAL HISTORY AND PHYSICAL:  [name] is a 77-year-  old white female with history of bladder cancer status post resection  with previous ileal loop  and now with a stoma, hypertension, coronary  artery disease, hyperlipidemia, who presented to the ED with a two-day  history of chills, fever and feeling cold.  The patient denied any chest  pain or shortness of breath, no nausea or vomiting or abdominal pain, no  hematemesis, no melena, no hematochezia, no back pain.  No visual  changes, no asymmetric weakness or numbness, no a facial asymmetry.  No  focal neurological symptoms.  The patient did also have a chronic cough  which has been ongoing for the past six weeks which is at times  productive with no other associated symptoms.  In the ED the patient was  found to have a temperature of 102.  Urinalysis was positive for  nitrates and leukocytes.  Chest x-ray was negative.  CBC were all was  low with a normal white count, hemoglobin of 10.7, platelets of 152,  hematocrit of 31.3 and left shift, with ANC of 8.2.  Chest x-ray was  negative.  We were called to admit the patient for further evaluation  and management.   PHYSICAL EXAM ON ADMISSION:  VITAL SIGNS:  Temperature 102, blood  pressure 153/60, pulse of one 01/06, respiratory rate 26, satting 91% on  room air.  GENERAL:  The patient in no apparent distress.  HEENT:  Normocephalic, atraumatic.  Pupils equal, round and reactive to  light.  Extraocular movements intact.  Oropharynx was clear.  No  lesions.  No exudates.  NECK:  Supple.  No lymphadenopathy.  RESPIRATORY:  Lungs were clear to auscultation bilaterally.  No wheezes,  no crackles, no rhonchi.  CARDIOVASCULAR:  Regular rate and rhythm with a 3/6 systolic ejection  murmur.  ABDOMEN:  Soft, nontender, nondistended.  Positive bowel sounds.  Stoma  with good granulation tissue.  No CVA tenderness.  No signs or symptoms  of infection of the stoma site.  EXTREMITIES:  No clubbing, cyanosis or edema.  NEUROLOGIC:  The patient was alert and oriented x3.  Cranial Nerves: II-  XII were grossly intact.  No focal deficits.    ADMISSION LABS:  1. CBC white count 9.9, hemoglobin 10.7, hematocrit 31.3, MCV of 94.2,      platelet count of 152, absolute neutrophil count was 8.2.  Iostat-8      with a sodium of 135, potassium of 3.8, chloride 100, glucose 126,      BUN 8, creatinine 0.70.  UA was yellow, cloudy, with specific  gravity 1.011, pH of 7, glucose negative, bilirubin negative,      ketones negative, blood small, protein negative, urobilinogen 0.2,      nitrite positive, leukocytes large; microscopy with WBCs of 21-50,      RBCs of 3-6, many bacteria.  2. Chest x-ray with no acute findings.   HOSPITAL COURSE:  1. Pyelonephritis.  The patient initially on admission had a positive      urinalysis and initially it was felt the patient may have had a      complicated upper respiratory tract infection with a history of      bladder cancer status post resection.  The patient's urine was      cultured.  The patient was initially placed on cefotaxime secondary      to multiple allergies.  The patient was hydrated with IV fluids and      the patient was monitored.  The evening of admission the patient      started to have some right CVA tenderness and it was felt that the      patient did indeed have a pyelonephritis.  Urine cultures came back      with greater than 100,000 colonies.  The patient's IV cefepime was      then switched to IV Rocephin.  The patient was managed with pain      management and supportive care with IV fluids.  The patient      improved symptomatically throughout the hospitalization.  The      patient remained afebrile throughout the hospitalization and by day      of discharge the patient's CVA tenderness had resolved.  The      patient will be discharged home on Ceftin 500 mg p.o. b.i.d. for 10      more days to complete a two-week course of antibiotics.  The      patient will need follow up with her PCP in two weeks and will need      a urinalysis rechecked for resolution of urinary  tract infection.      The renal ultrasound was done which came back negative for      hydronephrosis.  Per family's request the patient's urologist, Dr.      Orvan Falconer, was consulted who followed the patient throughout the      hospitalization  2. Altered mental status/metabolic encephalopathy felt to be secondary      to pyelonephritis:  During the hospitalization the patient had some      bouts of confusion.  The patient did not have any neurological      deficits and head CT was obtained which came back negative.  It was      felt the patient's altered mental status was metabolic in nature      secondary to her pyelonephritis.  The patient's mentation cleared      and she was close to baseline by day of discharge with treatment      for her pyelonephritis.  3. Chronic cough:  The patient had been complaining of a chronic cough      which had been ongoing for the past six weeks.  Sputum cultures      were checked which came back negative.  Chest x-ray was obtained      which was also negative.  The patient's Protonix was doubled during      the hospitalization but with no improvement in a chronic cough.  The patient remained afebrile with a normal white count.  A      followup outpatient appointment was made for the patient to follow      up with her pulmonologist, Dr. Sherene Sires, for further evaluation of her      chronic cough.  4. Diarrhea during the hospitalization:  Once the patient was on IV      Rocephin the patient developed some mild diarrhea.  The patient's      urine was checked for C. difficile and the patient's diarrhea and      stools were checked for C. difficile which came back negative.  The      patient did not have any ova and parasites and by discharge the      patient's diarrhea had improved and the rest of his stool studies      were pending.  5. Hypokalemia was repleted during the hospitalization.  6. Iron deficiency anemia:  The patient was noted to be anemic  during      the hospitalization.  However, there were no overt signs of a GI      bleed and fecal occult blood test was obtained which came back      negative.  The patient's hemoglobin remained stable throughout the      hospitalization.  This will need to be followed up as an outpatient      as the patient has not had a colonoscopy done.  She will likely      need a colonoscopy done and referral to GI.   The rest of the patient's chronic medical issues were stable throughout  the hospitalization and the patient was discharged in stable and  improved condition.   On the day of discharge vital signs were:  Temperature 98.2, pulse of  81, blood pressure 120/59, respiratory rate 20, satting 97% on room air.   DISCHARGE LABS:  Sodium 141, potassium 3.6, chloride 106, bicarb 27, BUN  5, creatinine 0.58, glucose one 01/01, calcium of 9.2.  CBC white count  6.7, hemoglobin 9.8 and platelet count of 188 and a hematocrit up to 85.   It was a pleasure taking care of [name].      Ramiro Harvest, MD  Electronically Signed     DT/MEDQ  D:  07/26/2008  T:  07/26/2008  Job:  [id number]   cc:   Dr. Holley Bouche of [facility]   Dr. Orvan Falconer, urologist   Dr. Sherene Sires of [facility] Aver, M.D.  Fax: [phone]

## 2011-05-11 NOTE — Discharge Summary (Signed)
NAMENIKOLETTE, REINDL NO.:  [id number]   MEDICAL RECORD NO.:  [id number]          PATIENT TYPE:  INP   LOCATION:  5013                         FACILITY:  [facility]   PHYSICIAN:  Madelynn Done, MD  DATE OF BIRTH:  1926/05/22   DATE OF ADMISSION:  06/05/2008  DATE OF DISCHARGE:  06/08/2008                               DISCHARGE SUMMARY   REASON FOR ADMISSION:  Right distal radial shaft fracture and distal  ulnar fracture.   DISCHARGE DIAGNOSES:  1. Right distal radius and ulnar fracture.  2. Osteoporosis.  3. Fall onto right wrist.   SURGICAL PROCEDURES:  Open reduction and internal fixation of right  radius on June 06, 2008.   DISCHARGE MEDICATIONS:  1. Vicodin 5/500 one to two tablets every 4-6 hours as needed for      pain.  2. Colace 100 mg p.o. b.i.d.   RECENT BRIEF HISTORY:  Mrs. Pelley is a 77 year old female who fell  onto her right wrist on June 05, 2008.  She sustained an injury to her  right distal wrist and forearm.  The patient was seen and evaluated in  the emergency department and closed manipulation of her right wrist was  performed.  The patient was admitted to undergo the above procedure the  following day.  The patient was admitted for IV pain control.   HOSPITAL COURSE:  This patient was admitted to the orthopedic floor.  The day after presentation, the patient underwent the procedure on  06/06/2008 without any intraoperative complications.  Postoperatively,  the pain was controlled on an IV fentanyl secondary to multiple drug  allergies.  The patient was seen and examined on postoperative day #1,  and it was not felt that she was ready to be discharged to home  secondary to IV pain control.  She was seen and examined on  postoperative day #2, she was afebrile.  Vital signs were stable and  normal.  She is tolerating a regular diet and was felt ready to be  discharged to home.  On the day of her discharge, the patient's vital  signs  were stable and normal.  She was tolerating a regular diet.  Prior  to her discharge, the patient's discharge instructions were explained to  her,  questions were answered, and the patient voiced understanding of  discharge instructions.   RECOMMENDATIONS ON DISPOSITION:  The patient is to be discharged to  home.  She will be seen back in the office in 10 days.  She is to keep  her splint clean and dry and not to use the right upper extremity.  She  was  instructed to call the office if she has any fevers, chills, nausea,  vomiting, or any worsening pain or problems of the right hand.  She was  given the office telephone number as well as my cell phone number if any  problems arise.   CONDITION ON DISCHARGE:  Good.      Madelynn Done, MD  Electronically Signed     FWO/MEDQ  D:  06/13/2008  T:  06/14/2008  Job:  [id number] 

## 2011-05-11 NOTE — Discharge Summary (Signed)
[name], LANINGHAM NO.:  [id number]   MEDICAL RECORD NO.:  [id number]          PATIENT TYPE:  OBV   LOCATION:  2011                         FACILITY:  [facility]   PHYSICIAN:  Georga Hacking, M.D.DATE OF BIRTH:  Apr 05, 1926   DATE OF ADMISSION:  10/10/2007  DATE OF DISCHARGE:  10/11/2007                               DISCHARGE SUMMARY   FINAL DIAGNOSES:  1. Pseudoaneurysm of right groin.  2. Coronary artery disease with catheterization done 2 days ago.  3. Hypertension.  4. Hyperlipidemia.   PROCEDURES:  Ultrasound-guided compression of right femoral artery  pseudoaneurysm.   HISTORY:  This 77 year old female was discharged yesterday following a  previous catheterization.  She had a late rebleed after the cath but  when seen the day prior to discharge was stable without bruit but had a  large ecchymosis noted.  The day of admission she developed an increased  pulsatile area in her right groin and was found to have a bruit a in the  office and was sent to the outpatient vascular lab, where she was found  to have a pseudoaneurysm.  She was brought in for observation for  compression of the pseudoaneurysm.  Please see the previously-dictated  history and physical for remainder of the details.   HOSPITAL COURSE:  PT and PTT were normal.  BUN is 11 and creatinine is  0.61.  She underwent ultrasound-guided compression with success and the  pseudoaneurysm was thrombosed.  She was reevaluated the next morning  with compression remaining successful with full thrombosis of the  pseudoaneurysm.  She was discharged home in improved condition.   She is to be on:  1. Atenolol 50 mg b.i.d.  2. BuSpar 7.5 b.i.d.  3. Crestor 10 mg daily.  4. Norvasc 5 mg daily.   She is to follow up in 1 week for follow-up of the pseudoaneurysm.      Georga Hacking, M.D.  Electronically Signed     WST/MEDQ  D:  11/02/2007  T:  11/03/2007  Job:  [id number]

## 2011-06-21 ENCOUNTER — Other Ambulatory Visit (INDEPENDENT_AMBULATORY_CARE_PROVIDER_SITE_OTHER): Payer: Medicare Other

## 2011-06-21 DIAGNOSIS — I6529 Occlusion and stenosis of unspecified carotid artery: Secondary | ICD-10-CM

## 2011-06-25 NOTE — Procedures (Unsigned)
CAROTID DUPLEX EXAM  INDICATION:  Follow up carotid stenosis.  HISTORY: Diabetes:  No Cardiac:  No Hypertension:  No Smoking:  Previous Previous Surgery:  No CV History:  Asymptomatic Amaurosis Fugax No, Paresthesias No, Hemiparesis No                                      RIGHT             LEFT Brachial systolic pressure:         122               128 Brachial Doppler waveforms:         WNL               WNL Vertebral direction of flow:        Antegrade         Antegrade DUPLEX VELOCITIES (cm/sec) CCA peak systolic                   99                97 ECA peak systolic                   127               138 ICA peak systolic                   113               195 ICA end diastolic                   30                33 PLAQUE MORPHOLOGY:                  Heterogenous      Heterogenous PLAQUE AMOUNT:                      Mild              Mild PLAQUE LOCATION:                    CCA, ICA, ECA     CCA, ICA, ECA  IMPRESSION: 1. Bilateral internal carotid artery stenosis in the 1% to 39% range. 2. Peak systolic velocity of the left internal carotid artery has     increased; however, no category change is observed. 3. Essentially unchanged study since previous study on 09/27/2009.  ___________________________________________ Larina Earthly, M.D.  SH/MEDQ  D:  06/21/2011  T:  06/21/2011  Job:  [id number]

## 2011-09-20 LAB — COMPREHENSIVE METABOLIC PANEL
ALT: 17
AST: 19
Albumin: 3.6
Calcium: 9
Chloride: 102
Creatinine, Ser: 0.55
GFR calc Af Amer: >60
Sodium: 135

## 2011-09-20 LAB — DIFFERENTIAL
Eosinophils Relative: 0
Lymphocytes Relative: 16
Lymphs Abs: 1.3
Monocytes Absolute: 0.4

## 2011-09-20 LAB — CBC
MCHC: 34.2
MCV: 93.8
Platelets: 163
WBC: 7.8

## 2011-09-21 LAB — DIFFERENTIAL
Basophils Absolute: 0.1
Basophils Relative: 0
Eosinophils Absolute: 0
Eosinophils Absolute: 0.1
Eosinophils Absolute: 0.1
Eosinophils Relative: 0
Lymphocytes Relative: 15
Lymphocytes Relative: 6 — ABNORMAL LOW
Lymphocytes Relative: 24
Lymphs Abs: 1.6
Lymphs Abs: 1.5
Monocytes Absolute: 0.9
Monocytes Relative: 7
Monocytes Relative: 9
Monocytes Relative: 12
Neutrophils Relative %: 75
Neutrophils Relative %: 77
Neutrophils Relative %: 62

## 2011-09-21 LAB — STOOL CULTURE

## 2011-09-21 LAB — EHEC TOXIN BY EIA, STOOL: EHEC Toxin by EIA: NEGATIVE

## 2011-09-21 LAB — CBC
HCT: 31.3 — ABNORMAL LOW
HCT: 27.9 — ABNORMAL LOW
HCT: 28.5 — ABNORMAL LOW
Hemoglobin: 10.7 — ABNORMAL LOW
Hemoglobin: 9.1 — ABNORMAL LOW
Hemoglobin: 9.8 — ABNORMAL LOW
MCHC: 33.8
MCHC: 34.1
MCV: 94.2
MCV: 94.8
MCV: 93.4
MCV: 93.5
Platelets: 125 — ABNORMAL LOW
Platelets: 150
Platelets: 171
Platelets: 188
RBC: 2.83 — ABNORMAL LOW
RBC: 2.99 — ABNORMAL LOW
RBC: 3.05 — ABNORMAL LOW
RDW: 13.2
RDW: 13.4
RDW: 13.1
WBC: 6
WBC: 6.7

## 2011-09-21 LAB — MAGNESIUM
Magnesium: 1.8
Magnesium: 2.1

## 2011-09-21 LAB — BASIC METABOLIC PANEL
BUN: 5 — ABNORMAL LOW
BUN: 5 — ABNORMAL LOW
CO2: 24
Calcium: 8.9
Chloride: 108
Chloride: 104
Chloride: 106
Creatinine, Ser: 0.6
Creatinine, Ser: 0.52
Creatinine, Ser: 0.53
GFR calc Af Amer: >60
GFR calc non Af Amer: >60
GFR calc non Af Amer: >60
GFR calc non Af Amer: >60
Glucose, Bld: 111 — ABNORMAL HIGH
Potassium: 3.6
Potassium: 3.9
Sodium: 140
Sodium: 141

## 2011-09-21 LAB — URINALYSIS, ROUTINE W REFLEX MICROSCOPIC
Glucose, UA: NEGATIVE
Protein, ur: NEGATIVE
Specific Gravity, Urine: 1.011
pH: 7

## 2011-09-21 LAB — AFB CULTURE WITH SMEAR (NOT AT ARMC): Acid Fast Smear: NONE SEEN

## 2011-09-21 LAB — URINE MICROSCOPIC-ADD ON

## 2011-09-21 LAB — CULTURE, BLOOD (ROUTINE X 2)
Culture: NO GROWTH
Culture: NO GROWTH

## 2011-09-21 LAB — CULTURE, RESPIRATORY W GRAM STAIN: Gram Stain: NONE SEEN

## 2011-09-21 LAB — URINE CULTURE

## 2011-09-21 LAB — OCCULT BLOOD X 1 CARD TO LAB, STOOL
Fecal Occult Bld: NEGATIVE
Fecal Occult Bld: POSITIVE

## 2011-09-21 LAB — RETICULOCYTES
RBC.: 3.5 — ABNORMAL LOW
Retic Ct Pct: 1.4

## 2011-09-21 LAB — POCT I-STAT, CHEM 8
BUN: 8
Calcium, Ion: 1.17
Creatinine, Ser: 0.7
TCO2: 26

## 2011-09-21 LAB — FUNGUS CULTURE W SMEAR

## 2011-09-21 LAB — CLOSTRIDIUM DIFFICILE EIA

## 2011-09-21 LAB — VITAMIN B12: Vitamin B-12: 220 (ref 211–911)

## 2011-09-21 LAB — OVA AND PARASITE EXAMINATION

## 2011-09-21 LAB — EXPECTORATED SPUTUM ASSESSMENT W GRAM STAIN, RFLX TO RESP C

## 2011-09-21 LAB — IRON AND TIBC: UIBC: 221

## 2011-09-21 LAB — FERRITIN: Ferritin: 170 (ref 10–291)

## 2011-09-21 LAB — COMPREHENSIVE METABOLIC PANEL
ALT: 16
AST: 18
Calcium: 9
GFR calc Af Amer: >60
Glucose, Bld: 107 — ABNORMAL HIGH
Sodium: 139
Total Protein: 5.1 — ABNORMAL LOW

## 2011-09-25 LAB — COMPREHENSIVE METABOLIC PANEL
ALT: 16
AST: 20
Albumin: 3.8
CO2: 28
Calcium: 9.1
Chloride: 105
Creatinine, Ser: 0.57
GFR calc Af Amer: >60
GFR calc non Af Amer: >60
Sodium: 137

## 2011-09-25 LAB — CBC
MCHC: 33.1
Platelets: 164
RBC: 3.59 — ABNORMAL LOW
WBC: 7.1

## 2011-09-25 LAB — URINALYSIS, ROUTINE W REFLEX MICROSCOPIC
Bilirubin Urine: NEGATIVE
Glucose, UA: NEGATIVE
Ketones, ur: NEGATIVE
Nitrite: POSITIVE — AB
Specific Gravity, Urine: 1.002 — ABNORMAL LOW
pH: 6.5

## 2011-09-25 LAB — URINE CULTURE: Colony Count: >=100000

## 2011-09-25 LAB — DIFFERENTIAL
Eosinophils Absolute: 0.1
Eosinophils Relative: 2
Lymphocytes Relative: 28
Lymphs Abs: 2
Monocytes Absolute: 0.8

## 2011-10-03 LAB — BASIC METABOLIC PANEL
CO2: 30
Calcium: 9.2
Creatinine, Ser: 0.61
GFR calc Af Amer: >60
GFR calc non Af Amer: >60
Sodium: 139

## 2011-10-03 LAB — CK TOTAL AND CKMB (NOT AT ARMC): Relative Index: INVALID

## 2011-10-03 LAB — LIPID PANEL
Cholesterol: 177
HDL: 58
LDL Cholesterol: 89
Total CHOL/HDL Ratio: 3.1
Triglycerides: 151 — ABNORMAL HIGH

## 2011-10-03 LAB — PROTIME-INR
INR: 1.1
Prothrombin Time: 14.5

## 2011-10-03 LAB — TROPONIN I: Troponin I: 0.01

## 2011-10-03 LAB — CARDIAC PANEL(CRET KIN+CKTOT+MB+TROPI)
CK, MB: 0.8
Relative Index: INVALID
Total CK: 23
Troponin I: <0.01

## 2011-10-03 LAB — APTT: aPTT: 27

## 2011-10-04 LAB — DIFFERENTIAL
Lymphs Abs: 2.3
Monocytes Absolute: 0.6
Monocytes Relative: 11
Neutro Abs: 2.7
Neutrophils Relative %: 48

## 2011-10-04 LAB — PROTIME-INR
INR: 0.9
Prothrombin Time: 12.4

## 2011-10-04 LAB — CBC
Hemoglobin: 11.5 — ABNORMAL LOW
MCHC: 33.6
Platelets: 191
RDW: 13.6

## 2011-10-04 LAB — COMPREHENSIVE METABOLIC PANEL
ALT: 16
Albumin: 3.5
Calcium: 9.7
Glucose, Bld: 139 — ABNORMAL HIGH
Sodium: 140
Total Protein: 5.9 — ABNORMAL LOW

## 2011-10-04 LAB — POCT CARDIAC MARKERS
CKMB, poc: <1 — ABNORMAL LOW
Myoglobin, poc: 43.7
Myoglobin, poc: 45
Operator id: 272551
Operator id: 277751
Troponin i, poc: <0.05

## 2011-12-10 ENCOUNTER — Other Ambulatory Visit (HOSPITAL_COMMUNITY): Payer: Self-pay | Admitting: Internal Medicine

## 2011-12-10 DIAGNOSIS — R05 Cough: Secondary | ICD-10-CM

## 2011-12-13 ENCOUNTER — Ambulatory Visit (HOSPITAL_COMMUNITY)
Admission: RE | Admit: 2011-12-13 | Discharge: 2011-12-13 | Disposition: A | Discharge status: Home / Self Care | Payer: Medicare Other | Source: Ambulatory Visit | Attending: Internal Medicine | Admitting: Internal Medicine

## 2011-12-13 DIAGNOSIS — R059 Cough, unspecified: Secondary | ICD-10-CM | POA: Insufficient documentation

## 2011-12-13 DIAGNOSIS — R05 Cough: Secondary | ICD-10-CM | POA: Insufficient documentation

## 2011-12-13 LAB — PULMONARY FUNCTION TEST

## 2011-12-21 ENCOUNTER — Telehealth: Payer: Self-pay | Admitting: Pulmonary Disease

## 2011-12-21 NOTE — Telephone Encounter (Signed)
Called spoke with patient and her husband - requesting earlier consult appt with KC.  Pt stated that her breathing has not worsened recently but has been "dealing with it for 4 weeks and is tired of it."  Informed patient and her husband that [facility] has no earlier openings but will forward message to him to address on Monday when he returns to the office as pt's husband stated that she "shouldn't wait until the 16th to be seen."  Dr Shelle Iron please advise, thanks!

## 2011-12-24 NOTE — Telephone Encounter (Signed)
Is there anywhere else to put pt for a new consult? Have megan take a look when she gets back.

## 2011-12-26 NOTE — Telephone Encounter (Signed)
Ok to offer her a consult appt with KC for 01/01/12 in the 4:15 slot.  Have her arrive a few mins early to fill out paperwork. Thanks!

## 2011-12-26 NOTE — Telephone Encounter (Signed)
Pt is scheduled to come in 01/01/12 at 4:15. Pt spouse aware to arrive 15 mins early to fill out paperwork.

## 2012-01-01 ENCOUNTER — Encounter: Payer: Self-pay | Admitting: Pulmonary Disease

## 2012-01-01 ENCOUNTER — Ambulatory Visit (INDEPENDENT_AMBULATORY_CARE_PROVIDER_SITE_OTHER): Payer: Medicare Other | Admitting: Pulmonary Disease

## 2012-01-01 VITALS — BP 142/70 | HR 64 | Temp 97.9°F | Ht 66.0 in | Wt 151.0 lb

## 2012-01-01 DIAGNOSIS — R05 Cough: Secondary | ICD-10-CM

## 2012-01-01 MED ORDER — BENZONATATE 100 MG PO CAPS: 100.0000 mg | ORAL_CAPSULE | Freq: Four times a day (QID) | ORAL | Status: AC | PRN

## 2012-01-01 NOTE — Patient Instructions (Signed)
Increase zegerid to am AND pm. Take chlorpheniramine 8mg  at bedtime each night (over the counter) Stop fish oil while we are working on your cough Minimize voice use.  No singing, yelling, and stay off phone as much as possible Use hard candy all during the day to reduce irritation and throat clearing ( no mint or menthol) Avoid throat clearing. Tessalon pearls as directed. Please call me in 2-3 weeks to give update on progress.

## 2012-01-01 NOTE — Progress Notes (Signed)
  Subjective:    Patient ID: [name], female    DOB: 1926/01/19, 78 y.o.   MRN: [id number]  HPI The patient is an 78 year old female who I've been asked to see for a chronic cough.  This started in September of last year, and she does not believe that it was associated with an upper respiratory infection.  This cough has persisted, and has not responded to a course of prednisone and antibiotics as well as Asmanex.  The cough is primarily dry in nature, but the patient feels "there is mucus present in her chest".  However, she is constantly clearing her throat during this visit, and admits to having significant postnasal drip.  She denies any sinus pressure or purulence.  She also denies a globus sensation, but does have cough paroxysms that occur out of the blue.  Her husband states the cough is clearly worse when she lies down.  The patient has a history of reflux disease, and has symptoms of regurgitation at times.  She has no history of asthma, and recent PFTs were essentially normal.  A recent chest x-ray was also clear.   Review of Systems  Constitutional: Negative for fever and unexpected weight change.  HENT: Positive for sore throat and sneezing. Negative for ear pain, nosebleeds, congestion, rhinorrhea, trouble swallowing, dental problem, postnasal drip and sinus pressure.   Eyes: Negative for redness and itching.  Respiratory: Positive for cough. Negative for chest tightness, shortness of breath and wheezing.   Cardiovascular: Positive for palpitations. Negative for leg swelling.  Gastrointestinal: Negative for nausea and vomiting.  Genitourinary: Negative for dysuria.  Musculoskeletal: Negative for joint swelling.  Skin: Negative for rash.  Neurological: Negative for headaches.  Hematological: Does not bruise/bleed easily.  Psychiatric/Behavioral: Negative for dysphoric mood. The patient is not nervous/anxious.        Objective:   Physical Exam Constitutional:  Well  developed, no acute distress  HENT:  Nares patent without discharge, but narrow bilat  Oropharynx without exudate, palate and uvula are normal  Eyes:  Perrla, eomi, no scleral icterus  Neck:  No JVD, no TMG  Cardiovascular:  Normal rate, regular rhythm, no rubs or gallops. 3/6 sem        Intact distal pulses  Pulmonary :  Normal breath sounds, no stridor or respiratory distress   No rales, rhonchi, or wheezing  Abdominal:  Soft, nondistended, bowel sounds present.  No tenderness noted.   Musculoskeletal:  Minimal lower extremity edema noted.  Lymph Nodes:  No cervical lymphadenopathy noted  Skin:  No cyanosis noted  Neurologic:  Alert, appropriate, moves all 4 extremities without obvious deficit.         Assessment & Plan:

## 2012-01-01 NOTE — Assessment & Plan Note (Signed)
The patient has a chronic cough of 4 months duration, and more than likely this is upper airway rather than lower in origin.  She has no airflow obstruction on PFTs, a clear chest x-ray, and a clear lung exam on auscultation.  She is having constant throat clearing while in the office today, and her husband states the cough clearly worsened upon lying down.  She does have postnasal drip, and also reflux symptoms.  At this point, I would like to treat her more aggressively for postnasal drip and reflux, and also with some of the behavioral therapies for cyclical coughing.  She will call in 2-3 weeks with her progress.

## 2012-01-07 ENCOUNTER — Telehealth: Payer: Self-pay | Admitting: Pulmonary Disease

## 2012-01-07 NOTE — Telephone Encounter (Signed)
LMOMTCB at work. I called cell # and his son answered and advised me to call back in 10 minutes and he will be around pt spouse wcb

## 2012-01-07 NOTE — Telephone Encounter (Signed)
We can call in a cough syrup for her to take every 6hrs if coughing, but will have a narcotic in it and will make her sleepy If he is ok with this, call in hycodan one teaspoon every 6 hrs for cough ( 6 ounces, no refills).

## 2012-01-07 NOTE — Telephone Encounter (Signed)
I spoke with spouse and he states pt is having an "out of control" dry cough. He states pt has stopped the fish oil, is using hard candy to bathe the back of her throat, tessalon pearls, taking chlorpheniramine 8mg  at bedtime, and is taking zegrid BID. Spouse is requesting further recs from Dr. Shelle Iron. Please advise, thanks  Allergies  Allergen Reactions  . Advil     Tachycardia, weakness  . Celecoxib     Celebrex.  Severe itching.   . Celexa     Hyper, nervousness  . Clindamycin     Itching, coughing  . Codeine     tachycardia  . Desipramine     Severe tachycardia  . Epinephrine     Severe shortness of breath.   . Erythromycin     Burning, itching  . Floxin (Ocuflox)     itching  . Glucose     Sugar.  Weakness,faintness  . Hydromorphone Hcl     Dilaudid.  Comatose-Anaphylactic reaction.   . Ibuprofen     Tachycardia. weakness  . Iodine     Hives, edema, tachycardia  . Meperidine Hcl     Demerol.  Tachycardia, extreme nervousness.   . Morphine     Tachycardia, hallucinations, extreme nervousness.  Marland Kitchen Paxil     Extreme nervousness  . Penicillins     Severe edema, severe difficulty breathing, anaphylactic reaction.  . Procaine Hcl     Novocain. Severe shortness of breath.  . Propranolol Hcl     Inderal.  Extreme low blood pressure.   . Red Dye     Severe itching  . Restoril     Temazepam.  Extreme nervousness, sleeplessness  . Scopolamine     Irrational, loss of memory  . Sulfonamide Derivatives     Hives, edema  . Tetracycline     Severe hives, edema  . Trazodone And Nefazodone     Very hyper  . Tylenol Pm Extra     Extreme tachycardia  . Urecholine     Chest pains. Coronary spasms  . Valium     itching

## 2012-01-07 NOTE — Telephone Encounter (Signed)
Because of the pt allergies we are not going to prescribe the pt anything for her cough per KC. The pt husband was advised. Carron Curie, CMA

## 2012-01-09 ENCOUNTER — Institutional Professional Consult (permissible substitution): Payer: Medicare Other | Admitting: Pulmonary Disease

## 2012-01-30 ENCOUNTER — Telehealth: Payer: Self-pay | Admitting: Pulmonary Disease

## 2012-01-30 NOTE — Telephone Encounter (Signed)
Let him know that I am glad her cough is better, but we need to continue to be vigilant with everything we discussed.  The most important thing is to avoid throat clearing. Have him call in another few weeks to give [place] another update on her progress.

## 2012-01-30 NOTE — Telephone Encounter (Signed)
I spoke with spouse and he was calling to give update on pt. He states her cough is 40% better. He states she keeping hard candy in her mouth to bathe the back of the throat and she is  taking the chlorpheniramine. He states he will call if he cough begins to get worse again. Will forward to [facility] as an [place]

## 2012-01-30 NOTE — Telephone Encounter (Signed)
Called and spoke with pt's husband.  Informed him of KC's response and recs.  Husband verbalized understanding and will have wife continue on recs and will call in a few weeks with another update

## 2012-02-12 ENCOUNTER — Ambulatory Visit (INDEPENDENT_AMBULATORY_CARE_PROVIDER_SITE_OTHER): Payer: Medicare Other | Admitting: Adult Health

## 2012-02-12 ENCOUNTER — Telehealth: Payer: Self-pay | Admitting: Pulmonary Disease

## 2012-02-12 ENCOUNTER — Encounter: Payer: Self-pay | Admitting: Adult Health

## 2012-02-12 VITALS — BP 106/56 | HR 61 | Temp 98.0°F | Ht 66.0 in | Wt 150.6 lb

## 2012-02-12 DIAGNOSIS — R05 Cough: Secondary | ICD-10-CM

## 2012-02-12 MED ORDER — PREDNISONE 10 MG PO TABS: ORAL_TABLET | ORAL | Status: DC

## 2012-02-12 MED ORDER — HYDROCODONE-HOMATROPINE 5-1.5 MG/5ML PO SYRP: 5.0000 mL | ORAL_SOLUTION | Freq: Four times a day (QID) | ORAL | Status: AC | PRN

## 2012-02-12 NOTE — Assessment & Plan Note (Signed)
Recurrent flare of upper airway cough syndrome with unrevealing workup thus far PFT nml, cxr nml   >advised husband on sedating effects of meds and to call if confusion (hx of dementia)  Plan:  Continue Zegerid Twice daily   Restart chlorpheniramine 8mg  at bedtime each night (over the counter) Minimize voice use.  No singing, yelling, and stay off phone as much as possible Use hard candy all during the day to reduce irritation and throat clearing ( no mint or menthol) Avoid throat clearing. Delsym 2 tsp Twice daily  As needed  Cough  Hydromet 1/2 tsp every 6 hr As needed  Cough- may make you sleepy  Prednisone taper over next week.  follow up Dr. Shelle Iron in 2 weeks and As needed  Please contact office for sooner follow up if symptoms do not improve or worsen or seek emergency care

## 2012-02-12 NOTE — Patient Instructions (Addendum)
Continue Zegerid Twice daily   Restart chlorpheniramine 8mg  at bedtime each night (over the counter) Minimize voice use.  No singing, yelling, and stay off phone as much as possible Use hard candy all during the day to reduce irritation and throat clearing ( no mint or menthol) Avoid throat clearing. Delsym 2 tsp Twice daily  As needed  Cough  Hydromet 1/2 tsp every 6 hr As needed  Cough- may make you sleepy  Prednisone taper over next week.  follow up Dr. Shelle Iron in 2 weeks and As needed  Please contact office for sooner follow up if symptoms do not improve or worsen or seek emergency care

## 2012-02-12 NOTE — Telephone Encounter (Signed)
Called, spoke with pt's husband.    States pt has a severe cough yesterday, last night, and today.  States it is dry, deep, and different than it has been.  OV scheduled with Tammy P for today at 4:15 -- husband aware and ok with this plan.

## 2012-02-12 NOTE — Progress Notes (Signed)
Subjective:    Patient ID: [name], female    DOB: 1926-06-19, 78 y.o.   MRN: [id number]  HPI 78 yo WF seen for initial pulmonary consult 01/01/12 for cough   01/01/12 Initial consult  78 year old female who I've been asked to see for a chronic cough.  This started in September of last year, and she does not believe that it was associated with an upper respiratory infection.  This cough has persisted, and has not responded to a course of prednisone and antibiotics as well as Asmanex.  The cough is primarily dry in nature, but the patient feels "there is mucus present in her chest".  However, she is constantly clearing her throat during this visit, and admits to having significant postnasal drip.  She denies any sinus pressure or purulence.  She also denies a globus sensation, but does have cough paroxysms that occur out of the blue.  Her husband states the cough is clearly worse when she lies down.  The patient has a history of reflux disease, and has symptoms of regurgitation at times.  She has no history of asthma, and recent PFTs were essentially normal.  A recent chest x-ray was also clear. >>tessalon, fish oil held, chlor tab At bedtime, PPI Twice daily      02/12/2012 Acute OV  Complains of dry cough, described as "course and deep," x3days  Pt is accompanied by her husband. Seen last month for evaluation for cough. Says she got about 50% better but now cough is back and severe for last 3 days. Coughs so hard she gets sick. Tessalon does not help at all. Takes delsym some. Says she took prednisone in past x 1 but only for couple of days because husband got sick and was not able to give her her meds. Would like to try again.  Denies fever, discolored mucus. Has coughing fits. Is taking zegerid but stopped chlor tabs. Is off fish oil  Work up with nml PFT and neg CXR . No sinus congestion.  Does clear throat. She has some dementia and is not a good historian.  No weight loss or hemopytsis.       Review of Systems Constitutional:   No  weight loss, night sweats,  Fevers, chills, fatigue, or  lassitude.  HEENT:   No headaches,  Difficulty swallowing,  Tooth/dental problems, or  Sore throat,                No sneezing, itching, ear ache, nasal congestion, post nasal drip,   CV:  No chest pain,  Orthopnea, PND, swelling in lower extremities, anasarca, dizziness, palpitations, syncope.   GI  No heartburn, indigestion, abdominal pain, nausea, vomiting, diarrhea, change in bowel habits, loss of appetite, bloody stools.   Resp: No shortness of breath with exertion or at rest.    No coughing up of blood.  No change in color of mucus.  No wheezing.  No chest wall deformity  Skin: no rash or lesions.  GU: no dysuria, change in color of urine, no urgency or frequency.  No flank pain, no hematuria   MS:  No joint pain or swelling.  No decreased range of motion.  No back pain.  Psych:  No change in mood or affect. No depression or anxiety.  No memory loss.         Objective:   Physical Exam GEN: A/Ox3; pleasant , NAD, well nourished   HEENT:  [place]/AT,  EACs-clear, TMs-wnl, NOSE-clear, THROAT-clear, no lesions,  no postnasal drip or exudate noted.   NECK:  Supple w/ fair ROM; no JVD; normal carotid impulses w/o bruits; no thyromegaly or nodules palpated; no lymphadenopathy.  RESP  Clear  P & A; w/o, wheezes/ rales/ or rhonchi.no accessory muscle use, no dullness to percussion  CARD:  RRR, no m/r/g  , no peripheral edema, pulses intact, no cyanosis or clubbing.  GI:   Soft & nt; nml bowel sounds; no organomegaly or masses detected.  Musco: Warm bil, no deformities or joint swelling noted.   Neuro: alert, no focal deficits noted.    Skin: Warm, no lesions or rashes         Assessment & Plan:

## 2012-02-13 NOTE — Progress Notes (Signed)
Ov reviewed, and agree with plan as outlined.  

## 2012-02-27 ENCOUNTER — Ambulatory Visit (INDEPENDENT_AMBULATORY_CARE_PROVIDER_SITE_OTHER): Payer: Medicare Other | Admitting: Pulmonary Disease

## 2012-02-27 ENCOUNTER — Encounter: Payer: Self-pay | Admitting: Pulmonary Disease

## 2012-02-27 VITALS — BP 134/70 | HR 75 | Temp 97.9°F | Ht 66.0 in | Wt 151.4 lb

## 2012-02-27 DIAGNOSIS — R059 Cough, unspecified: Secondary | ICD-10-CM

## 2012-02-27 DIAGNOSIS — R05 Cough: Secondary | ICD-10-CM

## 2012-02-27 NOTE — Patient Instructions (Signed)
Change chlorpheniramine to 4mg  at bedtime only if needed for nasal drip Can decrease zegerid to once in am. Stay off fish oil for another few months before restarting. If you feel your cough is starting to escalate again, please call me to discuss.  The goal will be to not let it get to the prior level.  You should start back on hard candy, no throat clearing, etc as soon as it increases.

## 2012-02-27 NOTE — Progress Notes (Signed)
  Subjective:    Patient ID: [name], female    DOB: Mar 02, 1926, 78 y.o.   MRN: [id number]  HPI The patient comes in today for followup of her chronic cough.  After initial treatment for upper airway/cyclical coughing, she had an improvement in the 40-50%, and then had a relapse and back to her prior level.  She was seen by our nurse practitioner, and her aggressive treatment for postnasal drip and reflux were reinitiated, along with a short course of prednisone for upper airway irritation.  The patient comes in today where she is doing much better.  Her cough has 100% resolved the last few days.   Review of Systems  Constitutional: Negative for fever and unexpected weight change.  HENT: Negative for ear pain, nosebleeds, congestion, sore throat, rhinorrhea, sneezing, trouble swallowing, dental problem, postnasal drip and sinus pressure.   Eyes: Negative for redness and itching.  Respiratory: Negative for cough, chest tightness, shortness of breath and wheezing.   Cardiovascular: Negative for palpitations and leg swelling.  Gastrointestinal: Negative for nausea and vomiting.  Genitourinary: Negative for dysuria.  Musculoskeletal: Negative for joint swelling.  Skin: Negative for rash.  Neurological: Negative for headaches.  Hematological: Does not bruise/bleed easily.  Psychiatric/Behavioral: Negative for dysphoric mood. The patient is not nervous/anxious.        Objective:   Physical Exam Thin female in no acute distress Nose without purulence or discharge noted Chest totally clear to auscultation, no wheezing Cardiac exam with regular rate and rhythm, 2/6 systolic murmur Lower extremities without edema, no cyanosis Alert, moves all 4 extremities.       Assessment & Plan:

## 2012-02-27 NOTE — Assessment & Plan Note (Signed)
The patient is much improved after being treated for postnasal drip, LPR, and cyclical coughing.  She currently feels that her cough is 100% resolved, however I do think she has an upper airway instability that will predispose her to recurrent episodes.  I have asked her to change her antihistamine to as needed, and decrease her proton pump inhibitor back to once a day.  If she sees any worsening of her cough, she may need to get back on these more consistently.  I have also reminded her of the behavioral therapy such as no throat clearing, hard candy, and voice rest.

## 2012-04-13 ENCOUNTER — Emergency Department (HOSPITAL_COMMUNITY)
Admission: EM | Admit: 2012-04-13 | Discharge: 2012-04-13 | Disposition: A | Discharge status: Home / Self Care | Payer: Medicare Other | Attending: Emergency Medicine | Admitting: Emergency Medicine

## 2012-04-13 ENCOUNTER — Encounter (HOSPITAL_COMMUNITY): Payer: Self-pay | Admitting: *Deleted

## 2012-04-13 DIAGNOSIS — F0390 Unspecified dementia without behavioral disturbance: Secondary | ICD-10-CM | POA: Insufficient documentation

## 2012-04-13 DIAGNOSIS — R51 Headache: Secondary | ICD-10-CM | POA: Insufficient documentation

## 2012-04-13 DIAGNOSIS — K219 Gastro-esophageal reflux disease without esophagitis: Secondary | ICD-10-CM | POA: Insufficient documentation

## 2012-04-13 DIAGNOSIS — I10 Essential (primary) hypertension: Secondary | ICD-10-CM | POA: Insufficient documentation

## 2012-04-13 DIAGNOSIS — E785 Hyperlipidemia, unspecified: Secondary | ICD-10-CM | POA: Insufficient documentation

## 2012-04-13 NOTE — ED Provider Notes (Signed)
History     CSN: [id number]  Arrival date & time 04/13/12  0254   First MD Initiated Contact with Patient 04/13/12 0534      Chief Complaint  Patient presents with  . Headache    (Consider location/radiation/quality/duration/timing/severity/associated sxs/prior treatment) HPI This is an 78 year old white female with a remote history of what she calls "sick headaches". She went to bed about 10 PM yesterday evening. She awoke at about 11 PM with a "sick headache". She states it was severe and located in the back of her head. There was no associated neurologic deficit, nausea, vomiting or photophobia. She took Tylenol with no immediate relief and so came to the ED. The headache resolved on its own about 3 AM without any additional medication.   Past Medical History  Diagnosis Date  . Bladder cancer     s/p cystectomy/ureterostomy  . Hypertension   . S/P appendectomy   . GERD (gastroesophageal reflux disease)   . Hyperlipidemia   . Dementia   . SVT (supraventricular tachycardia)   . Vitamin d deficiency   . Carotid stenosis   . Osteopenia   . Syncope   . Anxiety   . Cervical disc disease   . Pyelonephritis   . LBBB (left bundle branch block)   . Ventricular tachycardia   . CAD (coronary artery disease)   . PVC (premature ventricular contraction)   . Headache   . Cervical spondylosis   . Heartburn   . Hydronephrosis   . Hypoglycemia   . Rectocele   . Microscopic hematuria     Past Surgical History  Procedure Date  . Total abdominal hysterectomy w/ bilateral salpingoophorectomy   . Hernia repair   . Cystectomy   . Ureterostomy   . Appendectom     Family History  Problem Relation Age of Onset  . Lung cancer Father   . Heart disease Mother   . Heart disease Sister   . Allergies Mother   . Allergies Sister     History  Substance Use Topics  . Smoking status: Former Smoker -- 0.1 packs/day for 4 years    Types: Cigarettes    Quit date: 12/24/1949  .  Smokeless tobacco: Not on file  . Alcohol Use: No    OB History    Grav Para Term Preterm Abortions TAB SAB Ect Mult Living                  Review of Systems  All other systems reviewed and are negative.    Allergies  Celecoxib; Celexa; Childrens advil; Clindamycin; Codeine; Desipramine; Epinephrine; Erythromycin; Floxin; Glucose; Hydromorphone hcl; Ibuprofen; Iodine; Meperidine hcl; Morphine; Paxil; Penicillins; Procaine hcl; Propranolol hcl; Red dye; Restoril; Scopolamine; Sulfonamide derivatives; Tetracycline; Trazodone and nefazodone; Tylenol pm extra; Urecholine; and Valium  Home Medications   Current Outpatient Rx  Name Route Sig Dispense Refill  . AMLODIPINE BESYLATE PO Oral Take 1 tablet by mouth daily.     . ASPIRIN 81 MG PO TABS Oral Take 81 mg by mouth daily.      . ATENOLOL PO Oral Take 1 tablet by mouth 2 (two) times daily.      Marland Kitchen CALTRATE 600 PO Oral Take 1 tablet by mouth daily.      . CHLORPHENIRAMINE MALEATE 4 MG PO TABS Oral Take 4 mg by mouth at bedtime.    . STOOL SOFTENER PO Oral Take 1 capsule by mouth daily.      . DONEPEZIL HCL 10 MG PO  TABS Oral Take 10 mg by mouth daily.      Marland Kitchen MEMANTINE HCL 10 MG PO TABS Oral Take 10 mg by mouth 2 (two) times daily.      . MULTIVITAMINS PO CAPS Oral Take 1 capsule by mouth daily.      Marland Kitchen OMEPRAZOLE-SODIUM BICARBONATE 40-1100 MG PO CAPS Oral Take 1 capsule by mouth 2 (two) times daily.     Marland Kitchen LYRICA PO Oral Take 1 tablet by mouth daily.      . CRESTOR PO Oral Take 1 tablet by mouth every other day.      Marland Kitchen ZOLPIDEM TARTRATE ER 12.5 MG PO TBCR Oral Take 12.5 mg by mouth at bedtime.        BP 133/49  Pulse 64  Temp(Src) 97.4 F (36.3 C) (Oral)  Resp 16  SpO2 99%  Physical Exam General: Well-developed, well-nourished female in no acute distress; appearance consistent with age of record HENT: normocephalic, atraumatic Eyes: pupils equal round and reactive to light; extraocular muscles intact Neck: supple Heart:  regular rate and rhythm Lungs: clear to auscultation bilaterally Abdomen: soft; nondistended; nontender Extremities: No deformity; full range of motion Neurologic: Awake, alert and oriented; motor function intact in all extremities and symmetric; no facial droop; negative Romberg; normal finger to nose Skin: Warm and dry Psychiatric: Normal mood and affect    ED Course  Procedures (including critical care time)     MDM  The patient wishes to go home. She does not wish a CT scan or other evaluation at this time. She states she'll return should her headache return or should she have any neurologic deficits.          Hanley Seamen, MD 04/13/12 [id number]

## 2012-04-13 NOTE — ED Notes (Signed)
PT c/o headache that is unrelieved by OTC tylenol, unable to sleep due to headache, even after taking sleeping medication. PT denies n/v at this time.

## 2012-04-13 NOTE — Discharge Instructions (Signed)

## 2012-04-30 ENCOUNTER — Inpatient Hospital Stay (HOSPITAL_COMMUNITY)
Admission: EM | Admit: 2012-04-30 | Discharge: 2012-05-02 | DRG: 690 | Disposition: A | Discharge status: Home / Self Care | Payer: Medicare Other | Attending: Internal Medicine | Admitting: Internal Medicine

## 2012-04-30 ENCOUNTER — Encounter (HOSPITAL_COMMUNITY): Payer: Self-pay | Admitting: *Deleted

## 2012-04-30 DIAGNOSIS — G309 Alzheimer's disease, unspecified: Secondary | ICD-10-CM | POA: Diagnosis present

## 2012-04-30 DIAGNOSIS — K219 Gastro-esophageal reflux disease without esophagitis: Secondary | ICD-10-CM | POA: Diagnosis present

## 2012-04-30 DIAGNOSIS — R7301 Impaired fasting glucose: Secondary | ICD-10-CM | POA: Insufficient documentation

## 2012-04-30 DIAGNOSIS — E785 Hyperlipidemia, unspecified: Secondary | ICD-10-CM | POA: Diagnosis present

## 2012-04-30 DIAGNOSIS — F0280 Dementia in other diseases classified elsewhere without behavioral disturbance: Secondary | ICD-10-CM | POA: Diagnosis present

## 2012-04-30 DIAGNOSIS — R05 Cough: Secondary | ICD-10-CM | POA: Diagnosis present

## 2012-04-30 DIAGNOSIS — R410 Disorientation, unspecified: Secondary | ICD-10-CM | POA: Diagnosis present

## 2012-04-30 DIAGNOSIS — R059 Cough, unspecified: Secondary | ICD-10-CM | POA: Diagnosis present

## 2012-04-30 DIAGNOSIS — I10 Essential (primary) hypertension: Secondary | ICD-10-CM | POA: Diagnosis present

## 2012-04-30 DIAGNOSIS — R5381 Other malaise: Secondary | ICD-10-CM | POA: Diagnosis present

## 2012-04-30 DIAGNOSIS — R509 Fever, unspecified: Secondary | ICD-10-CM | POA: Diagnosis present

## 2012-04-30 DIAGNOSIS — F411 Generalized anxiety disorder: Secondary | ICD-10-CM | POA: Diagnosis present

## 2012-04-30 DIAGNOSIS — F29 Unspecified psychosis not due to a substance or known physiological condition: Secondary | ICD-10-CM | POA: Diagnosis present

## 2012-04-30 DIAGNOSIS — E860 Dehydration: Secondary | ICD-10-CM | POA: Diagnosis present

## 2012-04-30 DIAGNOSIS — N390 Urinary tract infection, site not specified: Principal | ICD-10-CM | POA: Diagnosis present

## 2012-04-30 DIAGNOSIS — I2510 Atherosclerotic heart disease of native coronary artery without angina pectoris: Secondary | ICD-10-CM | POA: Diagnosis present

## 2012-04-30 HISTORY — DX: Cardiac murmur, unspecified: R01.1

## 2012-04-30 LAB — COMPREHENSIVE METABOLIC PANEL
ALT: 13 U/L (ref 0–35)
AST: 17 U/L (ref 0–37)
Albumin: 3.6 g/dL (ref 3.5–5.2)
Alkaline Phosphatase: 83 U/L (ref 39–117)
CO2: 25 mEq/L (ref 19–32)
Chloride: 100 mEq/L (ref 96–112)
Creatinine, Ser: 0.68 mg/dL (ref 0.50–1.10)
Potassium: 4.2 mEq/L (ref 3.5–5.1)
Sodium: 135 mEq/L (ref 135–145)
Total Bilirubin: 0.2 mg/dL — ABNORMAL LOW (ref 0.3–1.2)

## 2012-04-30 LAB — CBC
MCH: 31.5 pg (ref 26.0–34.0)
MCHC: 33.6 g/dL (ref 30.0–36.0)
MCV: 93.7 fL (ref 78.0–100.0)
Platelets: 176 10*3/uL (ref 150–400)
RDW: 13.7 % (ref 11.5–15.5)

## 2012-04-30 LAB — DIFFERENTIAL
Basophils Absolute: 0 10*3/uL (ref 0.0–0.1)
Basophils Relative: 0 % (ref 0–1)
Lymphocytes Relative: 10 % — ABNORMAL LOW (ref 12–46)
Neutro Abs: 6.1 10*3/uL (ref 1.7–7.7)
Neutrophils Relative %: 79 % — ABNORMAL HIGH (ref 43–77)

## 2012-04-30 LAB — URINE MICROSCOPIC-ADD ON

## 2012-04-30 LAB — POCT I-STAT, CHEM 8
Calcium, Ion: 1.15 mmol/L (ref 1.12–1.32)
Creatinine, Ser: 0.8 mg/dL (ref 0.50–1.10)
Glucose, Bld: 122 mg/dL — ABNORMAL HIGH (ref 70–99)
Hemoglobin: 12.2 g/dL (ref 12.0–15.0)
Potassium: 4.9 mEq/L (ref 3.5–5.1)

## 2012-04-30 LAB — URINALYSIS, ROUTINE W REFLEX MICROSCOPIC
Bilirubin Urine: NEGATIVE
Protein, ur: 30 mg/dL — AB
Urobilinogen, UA: 0.2 mg/dL (ref 0.0–1.0)

## 2012-04-30 MED ORDER — ZOLPIDEM TARTRATE 5 MG PO TABS
5.0000 mg | ORAL_TABLET | Freq: Every day | ORAL | Status: DC
  Administered 2012-05-01 (×2): 5 mg via ORAL
  Filled 2012-04-30 (×2): qty 1

## 2012-04-30 MED ORDER — ROSUVASTATIN CALCIUM 10 MG PO TABS
10.0000 mg | ORAL_TABLET | Freq: Every day | ORAL | Status: DC
  Administered 2012-05-01: 10 mg via ORAL
  Filled 2012-04-30 (×2): qty 1

## 2012-04-30 MED ORDER — ACETAMINOPHEN 650 MG RE SUPP: 650.0000 mg | Freq: Four times a day (QID) | RECTAL | Status: DC | PRN

## 2012-04-30 MED ORDER — ASPIRIN EC 81 MG PO TBEC
81.0000 mg | DELAYED_RELEASE_TABLET | Freq: Every day | ORAL | Status: DC
  Administered 2012-05-01: 81 mg via ORAL
  Filled 2012-04-30 (×2): qty 1

## 2012-04-30 MED ORDER — ATENOLOL 25 MG PO TABS
25.0000 mg | ORAL_TABLET | Freq: Two times a day (BID) | ORAL | Status: DC
  Administered 2012-05-01 (×3): 25 mg via ORAL
  Filled 2012-04-30 (×5): qty 1

## 2012-04-30 MED ORDER — PROMETHAZINE HCL 25 MG PO TABS: 12.5000 mg | ORAL_TABLET | Freq: Four times a day (QID) | ORAL | Status: DC | PRN

## 2012-04-30 MED ORDER — BISACODYL 10 MG RE SUPP: 10.0000 mg | Freq: Every day | RECTAL | Status: DC | PRN

## 2012-04-30 MED ORDER — HYDROCODONE-ACETAMINOPHEN 5-325 MG PO TABS: 1.0000 | ORAL_TABLET | ORAL | Status: DC | PRN

## 2012-04-30 MED ORDER — MEMANTINE HCL 10 MG PO TABS
10.0000 mg | ORAL_TABLET | Freq: Two times a day (BID) | ORAL | Status: DC
  Administered 2012-05-01 (×3): 10 mg via ORAL
  Filled 2012-04-30 (×5): qty 1

## 2012-04-30 MED ORDER — ACETAMINOPHEN 325 MG PO TABS
650.0000 mg | ORAL_TABLET | Freq: Once | ORAL | Status: AC
  Administered 2012-04-30: 650 mg via ORAL
  Filled 2012-04-30: qty 2

## 2012-04-30 MED ORDER — ADULT MULTIVITAMIN W/MINERALS CH
1.0000 | ORAL_TABLET | Freq: Every day | ORAL | Status: DC
  Administered 2012-05-01: 1 via ORAL
  Filled 2012-04-30 (×2): qty 1

## 2012-04-30 MED ORDER — AMLODIPINE BESYLATE 5 MG PO TABS
5.0000 mg | ORAL_TABLET | Freq: Every day | ORAL | Status: DC
  Administered 2012-05-01: 5 mg via ORAL
  Filled 2012-04-30 (×2): qty 1

## 2012-04-30 MED ORDER — CIPROFLOXACIN IN D5W 400 MG/200ML IV SOLN
400.0000 mg | Freq: Two times a day (BID) | INTRAVENOUS | Status: DC
  Administered 2012-05-01 – 2012-05-02 (×2): 400 mg via INTRAVENOUS
  Filled 2012-04-30 (×3): qty 200

## 2012-04-30 MED ORDER — SENNOSIDES-DOCUSATE SODIUM 8.6-50 MG PO TABS
1.0000 | ORAL_TABLET | Freq: Every evening | ORAL | Status: DC | PRN
  Filled 2012-04-30: qty 1

## 2012-04-30 MED ORDER — PANTOPRAZOLE SODIUM 40 MG PO TBEC
40.0000 mg | DELAYED_RELEASE_TABLET | Freq: Every day | ORAL | Status: DC
  Administered 2012-05-01 (×2): 40 mg via ORAL
  Filled 2012-04-30 (×3): qty 1

## 2012-04-30 MED ORDER — DOCUSATE SODIUM 100 MG PO CAPS
100.0000 mg | ORAL_CAPSULE | Freq: Two times a day (BID) | ORAL | Status: DC | PRN
  Filled 2012-04-30: qty 1

## 2012-04-30 MED ORDER — CALCIUM CARBONATE 1250 (500 CA) MG PO TABS
1.0000 | ORAL_TABLET | Freq: Two times a day (BID) | ORAL | Status: DC
  Administered 2012-05-01 (×2): 500 mg via ORAL
  Filled 2012-04-30 (×4): qty 1

## 2012-04-30 MED ORDER — ACETAMINOPHEN 325 MG PO TABS
650.0000 mg | ORAL_TABLET | Freq: Four times a day (QID) | ORAL | Status: DC | PRN
  Administered 2012-05-01 (×2): 650 mg via ORAL
  Filled 2012-04-30 (×2): qty 2

## 2012-04-30 MED ORDER — POTASSIUM CHLORIDE IN NACL 20-0.9 MEQ/L-% IV SOLN
INTRAVENOUS | Status: DC
  Administered 2012-05-01: via INTRAVENOUS
  Filled 2012-04-30 (×4): qty 1000

## 2012-04-30 MED ORDER — CIPROFLOXACIN IN D5W 400 MG/200ML IV SOLN
400.0000 mg | Freq: Once | INTRAVENOUS | Status: AC
  Administered 2012-04-30: 400 mg via INTRAVENOUS
  Filled 2012-04-30: qty 200

## 2012-04-30 MED ORDER — SODIUM CHLORIDE 0.9 % IV BOLUS (SEPSIS)
250.0000 mL | Freq: Once | INTRAVENOUS | Status: AC
  Administered 2012-04-30: 250 mL via INTRAVENOUS

## 2012-04-30 MED ORDER — DONEPEZIL HCL 10 MG PO TABS
10.0000 mg | ORAL_TABLET | Freq: Every day | ORAL | Status: DC
  Administered 2012-05-01: 10 mg via ORAL
  Filled 2012-04-30 (×2): qty 1

## 2012-04-30 MED ORDER — ENOXAPARIN SODIUM 40 MG/0.4ML ~~LOC~~ SOLN
40.0000 mg | Freq: Every day | SUBCUTANEOUS | Status: DC
  Administered 2012-05-01 (×2): 40 mg via SUBCUTANEOUS
  Filled 2012-04-30 (×3): qty 0.4

## 2012-04-30 MED ORDER — PREGABALIN 25 MG PO CAPS
25.0000 mg | ORAL_CAPSULE | Freq: Three times a day (TID) | ORAL | Status: DC
  Administered 2012-05-01 (×4): 25 mg via ORAL
  Filled 2012-04-30 (×4): qty 1

## 2012-04-30 MED ORDER — ASPIRIN 81 MG PO TABS: 81.0000 mg | ORAL_TABLET | Freq: Every day | ORAL | Status: DC

## 2012-04-30 NOTE — ED Notes (Signed)
Bed:WA13<BR> Expected date:<BR> Expected time:<BR> Means of arrival:<BR> Comments:<BR> Hold for Res B

## 2012-04-30 NOTE — ED Notes (Signed)
Pt and husband denies any allergy to tylenol

## 2012-04-30 NOTE — ED Notes (Signed)
[name] [id number]

## 2012-04-30 NOTE — ED Provider Notes (Signed)
History     CSN: [id number]  Arrival date & time 04/30/12  1745   First MD Initiated Contact with Patient 04/30/12 1953      Chief Complaint  Patient presents with  . Weakness    (Consider location/radiation/quality/duration/timing/severity/associated sxs/prior treatment) HPI Comments: Patient is unable to give any kind of history husband states they weren't lunch today and noticed that his wife was weak getting up from the chair.  She also had a chill at lunch.  They went him.  She slept for a while and then was more disoriented and weak.  On ambulation.  He called EMS who examined her and felt she could wait until the morning.  He called their primary care Dr. Dr. Clelia Croft, who recommended transport to the emergency department for evaluation.  She does have a history of frequent UTIs as she has a stoma for a urostomy for bladder cancer since 2002  Patient is a 78 y.o. female presenting with weakness. The history is provided by the spouse.  Weakness The primary symptoms include fever and vomiting. Primary symptoms do not include dizziness.  Additional symptoms include weakness.    Past Medical History  Diagnosis Date  . Bladder cancer     s/p cystectomy/ureterostomy  . Hypertension   . S/P appendectomy   . GERD (gastroesophageal reflux disease)   . Hyperlipidemia   . Dementia   . SVT (supraventricular tachycardia)   . Vitamin d deficiency   . Carotid stenosis   . Osteopenia   . Syncope   . Anxiety   . Cervical disc disease   . Pyelonephritis   . LBBB (left bundle branch block)   . Ventricular tachycardia   . CAD (coronary artery disease)   . PVC (premature ventricular contraction)   . Headache   . Cervical spondylosis   . Heartburn   . Hydronephrosis   . Hypoglycemia   . Rectocele   . Microscopic hematuria     Past Surgical History  Procedure Date  . Total abdominal hysterectomy w/ bilateral salpingoophorectomy   . Hernia repair   . Cystectomy   . Ureterostomy     . Appendectom     Family History  Problem Relation Age of Onset  . Lung cancer Father   . Heart disease Mother   . Heart disease Sister   . Allergies Mother   . Allergies Sister     History  Substance Use Topics  . Smoking status: Former Smoker -- 0.1 packs/day for 4 years    Types: Cigarettes    Quit date: 12/24/1949  . Smokeless tobacco: Not on file  . Alcohol Use: No    OB History    Grav Para Term Preterm Abortions TAB SAB Ect Mult Living                  Review of Systems  Constitutional: Positive for fever and chills. Negative for appetite change.  Gastrointestinal: Positive for vomiting.  Genitourinary: Negative for dysuria.  Neurological: Positive for weakness. Negative for dizziness.    Allergies  Bethanechol chloride; Celecoxib; Citalopram hydrobromide; Clindamycin; Codeine; Desipramine; Epinephrine; Erythromycin; Floxin; Glucose; Hydromorphone hcl; Ibuprofen; Ibuprofen; Iodine; Meperidine hcl; Morphine; Paroxetine hcl; Penicillins; Procaine hcl; Propranolol hcl; Red dye; Restoril; Scopolamine; Sulfonamide derivatives; Tetracycline; Trazodone and nefazodone; and Valium  Home Medications   Current Outpatient Rx  Name Route Sig Dispense Refill  . AMLODIPINE BESYLATE PO Oral Take 1 tablet by mouth daily.     . ASPIRIN 81 MG PO TABS  Oral Take 81 mg by mouth daily.      . ATENOLOL PO Oral Take 1 tablet by mouth 2 (two) times daily.      Marland Kitchen CALTRATE 600 PO Oral Take 1 tablet by mouth daily.      . CHLORPHENIRAMINE MALEATE 4 MG PO TABS Oral Take 4 mg by mouth at bedtime.    . STOOL SOFTENER PO Oral Take 1 capsule by mouth daily.      . DONEPEZIL HCL 10 MG PO TABS Oral Take 10 mg by mouth daily.      Marland Kitchen MEMANTINE HCL 10 MG PO TABS Oral Take 10 mg by mouth 2 (two) times daily.      . MULTIVITAMINS PO CAPS Oral Take 1 capsule by mouth daily.      Marland Kitchen OMEPRAZOLE-SODIUM BICARBONATE 40-1100 MG PO CAPS Oral Take 1 capsule by mouth 2 (two) times daily.     Marland Kitchen LYRICA PO Oral  Take 1 tablet by mouth daily.      . CRESTOR PO Oral Take 1 tablet by mouth every other day.      Marland Kitchen ZOLPIDEM TARTRATE ER 12.5 MG PO TBCR Oral Take 12.5 mg by mouth at bedtime.        BP 127/51  Pulse 100  Temp(Src) 101.7 F (38.7 C) (Oral)  Resp 20  SpO2 94%  Physical Exam  Constitutional: She appears well-developed and well-nourished. She is cooperative.  Non-toxic appearance. She appears ill.  HENT:  Head: Normocephalic.  Eyes: Pupils are equal, round, and reactive to light.  Neck: Normal range of motion.  Cardiovascular: Normal rate.   Pulmonary/Chest: Effort normal.  Abdominal: Soft. She exhibits no distension. There is no tenderness.       Right-sided ileostomy  Musculoskeletal: Normal range of motion.  Neurological: She is alert. She is disoriented.  Skin: Skin is warm. There is pallor.    ED Course  Procedures (including critical care time)  Labs Reviewed  CBC - Abnormal; Notable for the following:    RBC 3.78 (*)    Hemoglobin 11.9 (*)    HCT 35.4 (*)    All other components within normal limits  DIFFERENTIAL - Abnormal; Notable for the following:    Neutrophils Relative 79 (*)    Lymphocytes Relative 10 (*)    All other components within normal limits  POCT I-STAT, CHEM 8 - Abnormal; Notable for the following:    Glucose, Bld 122 (*)    All other components within normal limits  URINALYSIS, ROUTINE W REFLEX MICROSCOPIC  COMPREHENSIVE METABOLIC PANEL   No results found.   No diagnosis found.    MDM  Feel that this is most likely UTI.  Due to symptoms, low-grade fever, and patient generally feeling weak        Arman Filter, NP 05/05/12 2006

## 2012-04-30 NOTE — ED Provider Notes (Signed)
Medical screening examination/treatment/procedure(s) were conducted as a shared visit with non-physician practitioner(s) and myself.  I personally evaluated the patient during the encounter Pt felt bad yest. Got worse today, dysuria. Husband says confused.  Febrile. Uti.  No leukocytosis.  No distress. Will admit since old with uti, fever, confusion  Cheri Guppy, MD 04/30/12 2354

## 2012-04-30 NOTE — ED Notes (Addendum)
Per [facility], Pt/family reports that she has experienced generalized weakness; Pt was diagnosed approx 10mo ago with "mild dementia". EMS has responded to pt house twice today, but pt was not transported the first time. Family states that pt has been consuming food/drink regularly; No observable neurological deficits assessed by EMS. Family reports that pt experienced blood poisoning a year ago and was confused/weak similar to now.

## 2012-04-30 NOTE — H&P (Addendum)
[name] is an 78 y.o. female.   Chief Complaint: I feel bad HPI:   The patient is an 78 year old Caucasian woman with several medical problems who was in her usual state of reasonable health until this morning when she began to have some nausea associated with anorexia. She also had low-grade temperatures later this afternoon. Her family noted that she was more confused than normal. She does have a history of mild Alzheimer's disease. She was brought to the emergency room by her husband for evaluation. In the emergency room her workup was most significant for an initial temperature of greater than 100, some confusion, and a urinalysis consistent with a urinary tract infection. Of note, she does have a ileal conduit from 2002 radical cystectomy with ureterostomy procedure because of bladder cancer. She denies symptoms of headache, neck pain, chest discomfort, cough, abdominal pain, diarrhea, constipation, anxiety, or depression.  Past Medical History  Diagnosis Date  . Bladder cancer     s/p cystectomy/ureterostomy  . Hypertension   . S/P appendectomy   . GERD (gastroesophageal reflux disease)   . Hyperlipidemia   . Dementia   . SVT (supraventricular tachycardia)   . Vitamin d deficiency   . Carotid stenosis   . Osteopenia   . Syncope   . Anxiety   . Cervical disc disease   . Pyelonephritis   . LBBB (left bundle branch block)   . Ventricular tachycardia   . CAD (coronary artery disease)   . PVC (premature ventricular contraction)   . Headache   . Cervical spondylosis   . Heartburn   . Hydronephrosis   . Hypoglycemia   . Rectocele   . Microscopic hematuria   . Heart murmur      (Not in a hospital admission)  ADDITIONAL HOME MEDICATIONS: Lyrica 50 milligrams by mouth 3 times a day, amlodipine 5 mg daily, aspirin 81 mg daily, atenolol 25 mg twice daily, Crestor 10 mg daily, Aricept 10 mg daily, Zegerid 20/1100 take 1 capsule by mouth once daily, tramadol 50 mg by mouth  every 8 hours as needed for pain, Flexeril 10 mg by mouth 3 times a day when necessary muscle spasm, Ambien CR 12.5 mg once daily, Premarin vaginal cream 0.5 g twice weekly, Namenda 10 mg twice daily, Mucinex as needed for cough, Zyrtec 10 mg daily as needed for congestion.  PHYSICIANS INVOLVED IN CARE: Kari Baars (PCP), Gretta Began (vsurg), Avie Echevaria (neuro), Marcelyn Bruins (pulm), Viann Fish (card), Karma Greaser Janetta Hora)  Past Surgical History  Procedure Date  . Total abdominal hysterectomy w/ bilateral salpingoophorectomy   . Hernia repair   . Cystectomy   . Ureterostomy   . Appendectom   . Cardiac catheterization   . Fracture surgery   . Abdominal hysterectomy     Family History  Problem Relation Age of Onset  . Lung cancer Father   . Heart disease Mother   . Heart disease Sister   . Allergies Mother   . Allergies Sister      Social History:  reports that she quit smoking about 62 years ago. Her smoking use included Cigarettes. She has a .4 pack-year smoking history. She does not have any smokeless tobacco history on file. She reports that she does not drink alcohol or use illicit drugs.  Allergies:  Allergies  Allergen Reactions  . Bethanechol Chloride     Chest pains. Coronary spasms  . Celecoxib     Celebrex.  Severe itching.   . Citalopram  Hydrobromide     Hyper, nervousness  . Clindamycin     Itching, coughing  . Codeine     tachycardia  . Desipramine     Severe tachycardia  . Epinephrine     Severe shortness of breath.   . Erythromycin     Burning, itching  . Floxin (Ocuflox)     itching  . Glucose     Sugar.  Weakness,faintness  . Hydromorphone Hcl     Dilaudid.  Comatose-Anaphylactic reaction.   . Ibuprofen     Tachycardia. weakness  . Ibuprofen     Tachycardia, weakness  . Iodine     Hives, edema, tachycardia  . Meperidine Hcl     Demerol.  Tachycardia, extreme nervousness.   . Morphine     Tachycardia, hallucinations, extreme  nervousness.  . Paroxetine Hcl     Extreme nervousness  . Penicillins     Severe edema, severe difficulty breathing, anaphylactic reaction.  . Procaine Hcl     Novocain. Severe shortness of breath.  . Propranolol Hcl     Inderal.  Extreme low blood pressure.   . Red Dye     Severe itching  . Restoril     Temazepam.  Extreme nervousness, sleeplessness  . Scopolamine     Irrational, loss of memory  . Sulfonamide Derivatives     Hives, edema  . Tetracycline     Severe hives, edema  . Trazodone And Nefazodone     Very hyper  . Valium     itching     ROS: cancer/tumor, Heart disease, heart murmur, high blood pressure and Alzheimer's disease, Chronic Low back pain, Hyperlipidemia, GERD, Insomnia  PHYSICAL EXAM: Blood pressure 113/38, pulse 83, temperature 98.5 F (36.9 C), temperature source Oral, resp. rate 19, SpO2 93.00%. In general, she is an elderly white woman who was well-nourished and well-developed and in no apparent distress. She had very mild word finding difficulty and mild confusion. HEENT exam was within normal limits, neck was supple without jugular venous distention or carotid bruit, chest was clear to auscultation, heart had a regular rate and rhythm with a systolic ejection murmur grade 2/6 at the left sternal border, abdomen had normal bowel sounds and no hepatosplenomegaly or tenderness and the right lower quadrant ileal conduit has a normal appearance, extremities were without cyanosis, clubbing, or edema and the pedal pulses were normal bilaterally, neurologic exam: She was alert and oriented x1+, cranial nerves II through XII are normal, sensory exam was grossly normal, motor strength was 5 out of 5 throughout, cerebellar function and gait were not evaluated.  Results for orders placed during the hospital encounter of 04/30/12 (from the past 48 hour(s))  CBC     Status: Abnormal   Collection Time   04/30/12  7:10 PM      Component Value Range Comment   WBC 7.8   4.0 - 10.5 (K/uL)    RBC 3.78 (*) 3.87 - 5.11 (MIL/uL)    Hemoglobin 11.9 (*) 12.0 - 15.0 (g/dL)    HCT 16.1 (*) 09.6 - 46.0 (%)    MCV 93.7  78.0 - 100.0 (fL)    MCH 31.5  26.0 - 34.0 (pg)    MCHC 33.6  30.0 - 36.0 (g/dL)    RDW 04.5  40.9 - 81.1 (%)    Platelets 176  150 - 400 (K/uL)   DIFFERENTIAL     Status: Abnormal   Collection Time   04/30/12  7:10 PM  Component Value Range Comment   Neutrophils Relative 79 (*) 43 - 77 (%)    Neutro Abs 6.1  1.7 - 7.7 (K/uL)    Lymphocytes Relative 10 (*) 12 - 46 (%)    Lymphs Abs 0.8  0.7 - 4.0 (K/uL)    Monocytes Relative 11  3 - 12 (%)    Monocytes Absolute 0.9  0.1 - 1.0 (K/uL)    Eosinophils Relative 0  0 - 5 (%)    Eosinophils Absolute 0.0  0.0 - 0.7 (K/uL)    Basophils Relative 0  0 - 1 (%)    Basophils Absolute 0.0  0.0 - 0.1 (K/uL)   COMPREHENSIVE METABOLIC PANEL     Status: Abnormal   Collection Time   04/30/12  7:10 PM      Component Value Range Comment   Sodium 135  135 - 145 (mEq/L)    Potassium 4.2  3.5 - 5.1 (mEq/L)    Chloride 100  96 - 112 (mEq/L)    CO2 25  19 - 32 (mEq/L)    Glucose, Bld 124 (*) 70 - 99 (mg/dL)    BUN 17  6 - 23 (mg/dL)    Creatinine, Ser 7.82  0.50 - 1.10 (mg/dL)    Calcium 9.1  8.4 - 10.5 (mg/dL)    Total Protein 6.8  6.0 - 8.3 (g/dL)    Albumin 3.6  3.5 - 5.2 (g/dL)    AST 17  0 - 37 (U/L)    ALT 13  0 - 35 (U/L)    Alkaline Phosphatase 83  39 - 117 (U/L)    Total Bilirubin 0.2 (*) 0.3 - 1.2 (mg/dL)    GFR calc non Af Amer 79 (*) >90 (mL/min)    GFR calc Af Amer >90  >90 (mL/min)   URINALYSIS, ROUTINE W REFLEX MICROSCOPIC     Status: Abnormal   Collection Time   04/30/12  7:53 PM      Component Value Range Comment   Color, Urine YELLOW  YELLOW     APPearance CLOUDY (*) CLEAR     Specific Gravity, Urine 1.016  1.005 - 1.030     pH 7.5  5.0 - 8.0     Glucose, UA NEGATIVE  NEGATIVE (mg/dL)    Hgb urine dipstick MODERATE (*) NEGATIVE     Bilirubin Urine NEGATIVE  NEGATIVE     Ketones, ur  NEGATIVE  NEGATIVE (mg/dL)    Protein, ur 30 (*) NEGATIVE (mg/dL)    Urobilinogen, UA 0.2  0.0 - 1.0 (mg/dL)    Nitrite POSITIVE (*) NEGATIVE     Leukocytes, UA LARGE (*) NEGATIVE    URINE MICROSCOPIC-ADD ON     Status: Abnormal   Collection Time   04/30/12  7:53 PM      Component Value Range Comment   WBC, UA TOO NUMEROUS TO COUNT  <3 (WBC/hpf)    RBC / HPF 0-2  <3 (RBC/hpf)    Bacteria, UA MANY (*) RARE    POCT I-STAT, CHEM 8     Status: Abnormal   Collection Time   04/30/12  8:04 PM      Component Value Range Comment   Sodium 137  135 - 145 (mEq/L)    Potassium 4.9  3.5 - 5.1 (mEq/L)    Chloride 105  96 - 112 (mEq/L)    BUN 22  6 - 23 (mg/dL)    Creatinine, Ser 9.56  0.50 - 1.10 (mg/dL)    Glucose,  Bld 122 (*) 70 - 99 (mg/dL)    Calcium, Ion 0.98  1.12 - 1.32 (mmol/L)    TCO2 27  0 - 100 (mmol/L)    Hemoglobin 12.2  12.0 - 15.0 (g/dL)    HCT 11.9  14.7 - 82.9 (%)    No results found.   Assessment/Plan #1 Urinary Tract Infection: Given her urinalysis finding it appears that she has another urinary tract infection. She is at risk for this given her ileal conduit. She has been given a dose of IV ciprofloxacin which we will continue pending results of her urine cultures. #2 Confusion: She has slight worsening of mild Alzheimer's disease they could be from recent fever and chills or could be from worsening Alzheimer's disease. We will give her gentle IV fluids and treat her urinary tract infection as described above. #3 Hypertension: Well controlled on current medications. #4 Chronic Cough: Stable on current medications.  Peyton Rossner G 04/30/2012, 10:59 PM

## 2012-04-30 NOTE — ED Notes (Signed)
Pt states "I just don't feel good." Denies and pain. Denies N/V/D, cough or sore throat. Pt able to state name and birthday. Unable to state date. NAD noted at this time. Respirations even and unlabored.

## 2012-04-30 NOTE — ED Notes (Signed)
JYN:WGNF<AO> Expected date:<BR> Expected time: 5:38 PM<BR> Means of arrival:<BR> Comments:<BR> m82 - 85yoF Weakness, incr confusion, dementia

## 2012-05-01 ENCOUNTER — Inpatient Hospital Stay (HOSPITAL_COMMUNITY): Payer: Medicare Other

## 2012-05-01 LAB — COMPREHENSIVE METABOLIC PANEL
BUN: 14 mg/dL (ref 6–23)
CO2: 25 mEq/L (ref 19–32)
Calcium: 8.8 mg/dL (ref 8.4–10.5)
Chloride: 101 mEq/L (ref 96–112)
Creatinine, Ser: 0.62 mg/dL (ref 0.50–1.10)
GFR calc Af Amer: >90 mL/min (ref >90)
GFR calc non Af Amer: 81 mL/min — ABNORMAL LOW (ref >90)
Total Bilirubin: 0.5 mg/dL (ref 0.3–1.2)

## 2012-05-01 MED ORDER — BENZONATATE 100 MG PO CAPS
200.0000 mg | ORAL_CAPSULE | Freq: Three times a day (TID) | ORAL | Status: DC | PRN
  Administered 2012-05-01: 200 mg via ORAL
  Filled 2012-05-01 (×2): qty 2

## 2012-05-01 NOTE — Progress Notes (Signed)
Subjective: Feels better.  History reviewed with the patient. Reports that she felt "lousy" recently with generalized weakness, fatigue.  Denies pain currently.    Objective: Vital signs in last 24 hours: Temp:  [98.1 F (36.7 C)-101.9 F (38.8 C)] 98.1 F (36.7 C) (05/09 0512) Pulse Rate:  [83-100] 95  (05/09 0512) Resp:  [14-21] 18  (05/09 0512) BP: (99-160)/(37-100) 110/62 mmHg (05/09 0512) SpO2:  [93 %-98 %] 93 % (05/09 0512) Weight:  [65.772 kg (145 lb)] 65.772 kg (145 lb) (05/09 0046) Weight change:  Last BM Date: 04/26/12  CBG (last 3)  No results found for this basename: GLUCAP:3 in the last 72 hours  Intake/Output from previous day: 05/08 0701 - 05/09 0700 In: -  Out: 850 [Urine:850] Intake/Output this shift:    General appearance: alert and fatigued Eyes: no scleral icterus Throat: oropharynx moist without erythema Resp: clear to auscultation bilaterally Cardio: regular rate and rhythm, S1, S2 normal, no murmur, click, rub or gallop GI: soft, non-tender; bowel sounds normal; no masses,  no organomegaly; stoma appears healthy with yellow urine in bag. Extremities: no clubbing, cyanosis or edema  Lab Results:  Basename 05/01/12 0405 04/30/12 2004 04/30/12 1910  NA 135 137 --  K 3.8 4.9 --  CL 101 105 --  CO2 25 -- 25  GLUCOSE 137* 122* --  BUN 14 22 --  CREATININE 0.62 0.80 --  CALCIUM 8.8 -- 9.1  MG -- -- --  PHOS -- -- --    Basename 05/01/12 0405 04/30/12 1910  AST 15 17  ALT 11 13  ALKPHOS 71 83  BILITOT 0.5 0.2*  PROT 6.1 6.8  ALBUMIN 3.2* 3.6    Basename 04/30/12 2004 04/30/12 1910  WBC -- 7.8  NEUTROABS -- 6.1  HGB 12.2 11.9*  HCT 36.0 35.4*  MCV -- 93.7  PLT -- 176   Lab Results  Component Value Date   INR 1.2 07/22/2008   INR 1.0 06/06/2008   INR 1.1 10/11/2007    Studies/Results: No results found.   Medications: Scheduled:   . acetaminophen  650 mg Oral Once  . amLODipine  5 mg Oral Daily  . aspirin EC  81 mg Oral Daily   . atenolol  25 mg Oral BID  . calcium carbonate  1 tablet Oral BID  . ciprofloxacin  400 mg Intravenous Once  . ciprofloxacin  400 mg Intravenous Q12H  . donepezil  10 mg Oral Daily  . enoxaparin  40 mg Subcutaneous QHS  . memantine  10 mg Oral BID  . mulitivitamin with minerals  1 tablet Oral Daily  . pantoprazole  40 mg Oral Q1200  . pregabalin  25 mg Oral TID  . rosuvastatin  10 mg Oral Daily  . sodium chloride  250 mL Intravenous Once  . zolpidem  5 mg Oral QHS  . DISCONTD: aspirin  81 mg Oral Daily   Continuous:   . 0.9 % NaCl with KCl 20 mEq / L 20 mL/hr at 05/01/12 0012    Assessment/Plan: Principal Problem: 1. *Urinary tract infection- continue IV fluid hydration and IV cipro X 24 hours.  Follow-up urine cultures.  Obtain Blood cultures to rule out bacteremia.  Fever resolved.  WBC pending.  Active Problems: 2. Delirium- confusion superimposed on underlying dementia.  Seems better this am. 3. Alzheimer disease- Continue Namenda and Aricept. 4. Generalized weakness- ambulate patient today. 5. Hypertension- stable on home regimen. 6. CAD (coronary artery disease)- continue medical therapy. 7. Disposition- anticipate  discharge tomorrow if stable to complete antibiotics.   LOS: 1 day   Hadlei Stitt,W [name] 05/01/2012, 8:07 AM

## 2012-05-01 NOTE — ED Notes (Signed)
Attempted to call report. Floor RN unable to accept report.  

## 2012-05-01 NOTE — Progress Notes (Signed)
1345 Oral temp. 102.0 Tylenol PO as ordered, Dr. Clelia Croft notified, no new orders, will monitor

## 2012-05-02 LAB — CBC
MCH: 31 pg (ref 26.0–34.0)
MCV: 94.2 fL (ref 78.0–100.0)
Platelets: 140 10*3/uL — ABNORMAL LOW (ref 150–400)
RBC: 3.45 MIL/uL — ABNORMAL LOW (ref 3.87–5.11)

## 2012-05-02 LAB — BASIC METABOLIC PANEL
BUN: 10 mg/dL (ref 6–23)
CO2: 26 mEq/L (ref 19–32)
Calcium: 9.1 mg/dL (ref 8.4–10.5)
Creatinine, Ser: 0.65 mg/dL (ref 0.50–1.10)
Glucose, Bld: 119 mg/dL — ABNORMAL HIGH (ref 70–99)
Sodium: 134 mEq/L — ABNORMAL LOW (ref 135–145)

## 2012-05-02 LAB — URINE CULTURE: Special Requests: NORMAL

## 2012-05-02 MED ORDER — CIPROFLOXACIN HCL 500 MG PO TABS: 500.0000 mg | ORAL_TABLET | Freq: Two times a day (BID) | ORAL | Status: AC

## 2012-05-02 NOTE — Progress Notes (Signed)
05/02/12 0845 Discharge instructions with prescription given to patient and her husband, verbalized understanding.  IV removed intact, d/c home in stable condition

## 2012-05-02 NOTE — Discharge Summary (Signed)
DISCHARGE SUMMARY  ISABELLA ROEMMICH  MR#: [id number]  DOB:Feb 28, 1926  Date of Admission: 04/30/2012 Date of Discharge: 05/02/2012  Attending Physician:Juris Gosnell,W [name]  Patient's WUJ:WJXB,J Riley Lam, MD, MD  Consults:  none indicated  Discharge Diagnoses: Principal Problem:  *Urinary tract infection Active Problems:  Hypertension  CAD (coronary artery disease)  Delirium  Alzheimer disease  Past Medical History  Diagnosis Date  . Bladder cancer     s/p cystectomy/ureterostomy  . Hypertension   . S/P appendectomy   . GERD (gastroesophageal reflux disease)   . Hyperlipidemia   . Dementia   . SVT (supraventricular tachycardia)   . Vitamin d deficiency   . Carotid stenosis   . Osteopenia   . Syncope   . Anxiety   . Cervical disc disease   . Pyelonephritis   . LBBB (left bundle branch block)   . Ventricular tachycardia   . CAD (coronary artery disease)   . PVC (premature ventricular contraction)   . Headache   . Cervical spondylosis   . Heartburn   . Hydronephrosis   . Hypoglycemia   . Rectocele   . Microscopic hematuria   . Heart murmur    Past Surgical History  Procedure Date  . Total abdominal hysterectomy w/ bilateral salpingoophorectomy   . Hernia repair   . Cystectomy   . Ureterostomy   . Appendectom   . Cardiac catheterization   . Fracture surgery   . Abdominal hysterectomy     Discharge Medications: Medication List  As of 05/02/2012  7:34 AM   TAKE these medications         AMLODIPINE BESYLATE PO   Take 1 tablet by mouth daily.      aspirin 81 MG tablet   Take 81 mg by mouth daily.      ATENOLOL PO   Take 1 tablet by mouth 2 (two) times daily.      CALTRATE 600 PO   Take 1 tablet by mouth daily.      chlorpheniramine 4 MG tablet   Commonly known as: CHLOR-TRIMETON   Take 4 mg by mouth at bedtime.      ciprofloxacin 500 MG tablet   Commonly known as: CIPRO   Take 1 tablet (500 mg total) by mouth 2 (two) times daily.      CRESTOR PO     Take 1 tablet by mouth every other day.      donepezil 10 MG tablet   Commonly known as: ARICEPT   Take 10 mg by mouth daily.      LYRICA PO   Take 1 tablet by mouth daily.      memantine 10 MG tablet   Commonly known as: NAMENDA   Take 10 mg by mouth 2 (two) times daily.      multivitamin capsule   Take 1 capsule by mouth daily.      omeprazole-sodium bicarbonate 40-1100 MG per capsule   Commonly known as: ZEGERID   Take 1 capsule by mouth 2 (two) times daily.      STOOL SOFTENER PO   Take 1 capsule by mouth daily.      zolpidem 12.5 MG CR tablet   Commonly known as: AMBIEN CR   Take 12.5 mg by mouth at bedtime.            Hospital Procedures: Dg Chest 2 View  05/01/2012  *RADIOLOGY REPORT*  Clinical Data: Fever.  Cough.  Generalized weakness.  History of bladder cancer.  CHEST - 2 VIEW  05/01/2012:  Comparison: Two-view chest x-ray 02/11/2011, 02/09/2011, 08/02/2009.  Findings: Cardiac silhouette upper normal in size, slightly increased in size since 2010 even allowing for differences in technique, unchanged since 2012.  Hilar and mediastinal contours otherwise unremarkable.  Prominent bronchovascular markings diffusely and mild central peribronchial thickening, slightly more so than on the prior examinations.  No confluent airspace consolidation.  No pleural effusions.  Degenerative changes involving the thoracic spine.  IMPRESSION: Mild changes of acute bronchitis and/or asthma without localized pneumonia.  Mild cardiomegaly without pulmonary edema.  Original Report Authenticated By: Arnell Sieving, M.D.    History of Present Illness: From Dr. Silvano Rusk admission H&P: The patient is an 78 year old Caucasian woman with several medical problems who was in her usual state of reasonable health until this morning when she began to have some nausea associated with anorexia. She also had low-grade temperatures later this afternoon. Her family noted that she was more confused  than normal. She does have a history of mild Alzheimer's disease. She was brought to the emergency room by her husband for evaluation. In the emergency room her workup was most significant for an initial temperature of greater than 100, some confusion, and a urinalysis consistent with a urinary tract infection. Of note, she does have a ileal conduit from 2002 radical cystectomy with ureterostomy procedure because of bladder cancer. She denies symptoms of headache, neck pain, chest discomfort, cough, abdominal pain, diarrhea, constipation, anxiety, or depression.  Hospital Course: Ms. Pokorny was admitted to a medical bed. She was placed on IV Cipro for treatment of atopic a urinary tract infection. She was given IV fluid hydration due to mild dehydration. With this, she had significant improvement in her generalized weakness, fatigue, and overall lethargy. She did have a fever of 102.0 on the day prior to discharge but has defervesced and remained afebrile overnight. Urine cultures have returned as multiple species which is expected given her ureterostomy. Chest x-ray shows no signs of pneumonia despite her chronic cough. Blood cultures are negative to date at this point. Given improvement in her symptoms she is stable for discharge home to complete a seven-day course of Cipro.  Day of Discharge Exam BP 110/62  Pulse 75  Temp(Src) 98.5 F (36.9 C) (Oral)  Resp 18  Ht 5\' 6"  (1.676 m)  Wt 65.772 kg (145 lb)  BMI 23.40 kg/m2  SpO2 94%  Physical Exam: General appearance: alert and no distress Eyes: no scleral icterus Throat: oropharynx moist without erythema Resp: clear to auscultation bilaterally Cardio: regular rate and rhythm, S1, S2 normal, no murmur, click, rub or gallop GI: soft, non-tender; bowel sounds normal; no masses,  no organomegaly Extremities: no clubbing, cyanosis or edema  Discharge Labs:  [facility] 05/02/12 0408 05/01/12 0405  NA 134* 135  K 3.8 3.8  CL 101 101  CO2 26 25   GLUCOSE 119* 137*  BUN 10 14  CREATININE 0.65 0.62  CALCIUM 9.1 8.8  MG -- --  PHOS -- --    Basename 05/01/12 0405 04/30/12 1910  AST 15 17  ALT 11 13  ALKPHOS 71 83  BILITOT 0.5 0.2*  PROT 6.1 6.8  ALBUMIN 3.2* 3.6    Basename 05/02/12 0408 04/30/12 2004 04/30/12 1910  WBC 6.7 -- 7.8  NEUTROABS -- -- 6.1  HGB 10.7* 12.2 --  HCT 32.5* 36.0 --  MCV 94.2 -- 93.7  PLT 140* -- 176   Lab Results  Component Value Date   INR 1.2 07/22/2008   INR  1.0 06/06/2008   INR 1.1 10/11/2007    Discharge instructions: Discharge Orders    Future Orders Please Complete By Expires   Diet - low sodium heart healthy      Increase activity slowly      Discharge instructions      Comments:   Call if fever above 101.5. Call if increased weakness, pain.      Disposition: To home  Follow-up Appts: Follow-up with Dr. Clelia Croft at [facility] in 1 week.    Condition on Discharge: Improved  Tests Needing Follow-up: Followup blood cultures  Signed: Minie Roadcap,W [name] 05/02/2012, 7:34 AM

## 2012-05-02 NOTE — Discharge Instructions (Signed)

## 2012-05-05 NOTE — ED Provider Notes (Signed)
Medical screening examination/treatment/procedure(s) were performed by non-physician practitioner and as supervising physician I was immediately available for consultation/collaboration.  Cheri Guppy, MD 05/05/12 [phone]

## 2012-05-07 LAB — CULTURE, BLOOD (ROUTINE X 2)
Culture  Setup Time: 201305091056
Culture: NO GROWTH

## 2012-10-01 ENCOUNTER — Encounter: Payer: Self-pay | Admitting: Vascular Surgery

## 2012-10-13 ENCOUNTER — Other Ambulatory Visit: Payer: Self-pay | Admitting: *Deleted

## 2012-10-13 DIAGNOSIS — I6529 Occlusion and stenosis of unspecified carotid artery: Secondary | ICD-10-CM

## 2012-10-17 ENCOUNTER — Encounter: Payer: Self-pay | Admitting: Neurosurgery

## 2012-10-20 ENCOUNTER — Other Ambulatory Visit: Payer: Medicare Other

## 2012-10-20 ENCOUNTER — Ambulatory Visit: Payer: Medicare Other | Admitting: Neurosurgery

## 2012-10-30 ENCOUNTER — Encounter: Payer: Self-pay | Admitting: Neurosurgery

## 2012-10-31 ENCOUNTER — Encounter: Payer: Self-pay | Admitting: Pulmonary Disease

## 2012-10-31 ENCOUNTER — Ambulatory Visit (INDEPENDENT_AMBULATORY_CARE_PROVIDER_SITE_OTHER): Payer: Medicare Other | Admitting: Pulmonary Disease

## 2012-10-31 ENCOUNTER — Telehealth: Payer: Self-pay | Admitting: Pulmonary Disease

## 2012-10-31 ENCOUNTER — Ambulatory Visit (INDEPENDENT_AMBULATORY_CARE_PROVIDER_SITE_OTHER): Payer: Medicare Other | Admitting: Neurosurgery

## 2012-10-31 ENCOUNTER — Ambulatory Visit (INDEPENDENT_AMBULATORY_CARE_PROVIDER_SITE_OTHER): Payer: Medicare Other | Admitting: Vascular Surgery

## 2012-10-31 ENCOUNTER — Other Ambulatory Visit: Payer: Medicare Other

## 2012-10-31 ENCOUNTER — Ambulatory Visit: Payer: Medicare Other | Admitting: Neurosurgery

## 2012-10-31 ENCOUNTER — Encounter: Payer: Self-pay | Admitting: Neurosurgery

## 2012-10-31 VITALS — BP 100/60 | HR 66 | Temp 97.6°F | Ht 66.0 in | Wt 145.8 lb

## 2012-10-31 VITALS — BP 120/57 | HR 68 | Resp 16 | Wt 143.0 lb

## 2012-10-31 DIAGNOSIS — R05 Cough: Secondary | ICD-10-CM

## 2012-10-31 DIAGNOSIS — I6529 Occlusion and stenosis of unspecified carotid artery: Secondary | ICD-10-CM

## 2012-10-31 MED ORDER — PREDNISONE 10 MG PO TABS: ORAL_TABLET | ORAL | Status: DC

## 2012-10-31 NOTE — Telephone Encounter (Signed)
No need for message. °

## 2012-10-31 NOTE — Progress Notes (Signed)
  Subjective:    Patient ID: [name], female    DOB: 08-28-1926, 78 y.o.   MRN: [id number]  HPI Patient comes in today for an acute sick visit related to chronic cough.  She has a propensity toward cyclical coughing, and she has improved in the past with treatment of allergic rhinitis/postnasal drip as well as reflux.  The husband notes a lot of allergy symptoms through the spring which resulted in escalation of cough through the summer.  This has continued, and they're coming in today for further evaluation.  She is definitely having postnasal drip and sneezing, and has also had increased reflux symptoms most recently.  She denies any mucus production, or change in her exertional tolerance.  She denies shortness of breath.   Review of Systems  Constitutional: Negative for fever and unexpected weight change.  HENT: Positive for congestion and rhinorrhea. Negative for ear pain, nosebleeds, sore throat, sneezing, trouble swallowing, dental problem, postnasal drip and sinus pressure.   Eyes: Negative for redness and itching.  Respiratory: Positive for cough. Negative for chest tightness, shortness of breath and wheezing.   Cardiovascular: Negative for palpitations and leg swelling.  Gastrointestinal: Negative for nausea and vomiting.  Genitourinary: Negative for dysuria.  Musculoskeletal: Negative for joint swelling.  Skin: Negative for rash.  Neurological: Positive for headaches.  Hematological: Bruises/bleeds easily.  Psychiatric/Behavioral: Negative for dysphoric mood. The patient is not nervous/anxious.        Objective:   Physical Exam Thin female in no acute distress Nose without purulent discharge noted Oropharynx clear Chest totally clear to auscultation Cardiac exam is regular rate and rhythm Lower extremities without edema, cyanosis Alert and oriented, moves all 4 extremities.       Assessment & Plan:

## 2012-10-31 NOTE — Patient Instructions (Addendum)
Take chlorpheniramine 4mg  one tab at bedtime for next 2 weeks. Continue zyrtec 10mg  each am Increase zegerid to am AND pm for next 2 weeks. nasonex nasal spray, 2 each nostril each am for 2 weeks. Prednisone taper as directed  Don't forget about behavioral therapies such as hard candy to bathe back of throat, no throat clearing, minimize voice use for next few weeks. Please call if you are not getting better.

## 2012-10-31 NOTE — Assessment & Plan Note (Signed)
The patient has a history of chronic cough secondary to postnasal drip and reflux disease, and has a propensity toward cyclical coughing.  She has had increased allergy symptoms with postnasal drip and some increased reflux I suspect is due to her coughing.  I would like to treat her allergies more aggressively for a few weeks, and also increased reflux treatment and told her cough improves.  She is to call me if this doesn't help.

## 2012-10-31 NOTE — Progress Notes (Signed)
Carotid duplex performed @ [facility] 10/31/2012 

## 2012-10-31 NOTE — Progress Notes (Signed)
[facility] Carotid Office Note  CC: Carotid surveillance Referring Physician: Early  History of Present Illness: 78 year old female patient of Dr. Arbie Cookey with no history of carotid intervention. The patient denies any signs or symptoms of CVA, TIA, amaurosis fugax or any neural deficit. The patient denies any new medical diagnoses or recent surgery.  Past Medical History  Diagnosis Date  . Bladder cancer     s/p cystectomy/ureterostomy  . Hypertension   . S/P appendectomy   . GERD (gastroesophageal reflux disease)   . Hyperlipidemia   . Dementia   . SVT (supraventricular tachycardia)   . Vitamin D deficiency   . Carotid stenosis   . Osteopenia   . Syncope   . Anxiety   . Cervical disc disease   . Pyelonephritis   . LBBB (left bundle branch block)   . Ventricular tachycardia   . CAD (coronary artery disease)   . PVC (premature ventricular contraction)   . Headache   . Cervical spondylosis   . Heartburn   . Hydronephrosis   . Hypoglycemia   . Rectocele   . Microscopic hematuria   . Heart murmur     ROS: [x]  Positive   [ ]  Denies    General: [ ]  Weight loss, [ ]  Fever, [ ]  chills Neurologic: [ ]  Dizziness, [ ]  Blackouts, [ ]  Seizure [ ]  Stroke, [ ]  "Mini stroke", [ ]  Slurred speech, [ ]  Temporary blindness; [ ]  weakness in arms or legs, [ ]  Hoarseness Cardiac: [ ]  Chest pain/pressure, [ ]  Shortness of breath at rest [ ]  Shortness of breath with exertion, [ ]  Atrial fibrillation or irregular heartbeat Vascular: [ ]  Pain in legs with walking, [ ]  Pain in legs at rest, [ ]  Pain in legs at night,  [ ]  Non-healing ulcer, [ ]  Blood clot in vein/DVT,   Pulmonary: [ ]  Home oxygen, [ ]  Productive cough, [ ]  Coughing up blood, [ ]  Asthma,  [ ]  Wheezing Musculoskeletal:  [ ]  Arthritis, [ ]  Low back pain, [ ]  Joint pain Hematologic: [ ]  Easy Bruising, [ ]  Anemia; [ ]  Hepatitis Gastrointestinal: [ ]  Blood in stool, [ ]  Gastroesophageal  Reflux/heartburn, [ ]  Trouble swallowing Urinary: [ ]  chronic Kidney disease, [ ]  on HD - [ ]  MWF or [ ]  TTHS, [ ]  Burning with urination, [ ]  Difficulty urinating Skin: [ ]  Rashes, [ ]  Wounds Psychological: [ ]  Anxiety, [ ]  Depression   Social History History  Substance Use Topics  . Smoking status: Former Smoker -- 0.1 packs/day for 4 years    Types: Cigarettes    Quit date: 12/24/1949  . Smokeless tobacco: Not on file  . Alcohol Use: No    Family History Family History  Problem Relation Age of Onset  . Lung cancer Father   . Cancer Father   . Heart attack Father   . Heart disease Mother     Heart Disease before age 13  . Allergies Mother   . Hypertension Mother   . Heart attack Mother   . Heart disease Sister   . Allergies Sister     Allergies  Allergen Reactions  . Bethanechol Chloride     Chest pains. Coronary spasms  . Celecoxib     Celebrex.  Severe itching.   . Citalopram Hydrobromide     Hyper, nervousness  . Clindamycin     Itching, coughing  . Codeine     tachycardia  . Desipramine  Severe tachycardia  . Epinephrine     Severe shortness of breath.   . Erythromycin     Burning, itching  . Floxin (Ocuflox)     itching  . Glucose     Sugar.  Weakness,faintness  . Hydromorphone Hcl     Dilaudid.  Comatose-Anaphylactic reaction.   . Ibuprofen     Tachycardia. weakness  . Ibuprofen     Tachycardia, weakness  . Iodine     Hives, edema, tachycardia  . Meperidine Hcl     Demerol.  Tachycardia, extreme nervousness.   . Morphine     Tachycardia, hallucinations, extreme nervousness.  . Paroxetine Hcl     Extreme nervousness  . Penicillins     Severe edema, severe difficulty breathing, anaphylactic reaction.  . Procaine Hcl     Novocain. Severe shortness of breath.  . Propranolol Hcl     Inderal.  Extreme low blood pressure.   . Red Dye     Severe itching  . Restoril     Temazepam.  Extreme nervousness, sleeplessness  . Scopolamine      Irrational, loss of memory  . Sulfonamide Derivatives     Hives, edema  . Tetracycline     Severe hives, edema  . Trazodone And Nefazodone     Very hyper  . Valium     itching    Current Outpatient Prescriptions  Medication Sig Dispense Refill  . AMLODIPINE BESYLATE PO Take 1 tablet by mouth daily. 5mg       . aspirin 81 MG tablet Take 81 mg by mouth daily.        . ATENOLOL PO Take 1 tablet by mouth 2 (two) times daily. 25mg       . busPIRone (BUSPAR) 7.5 MG tablet Take 7.5 mg by mouth 2 (two) times daily.      . Calcium Carbonate (CALTRATE 600 PO) Take 1 tablet by mouth daily.        . chlorpheniramine (CHLOR-TRIMETON) 4 MG tablet Take 4 mg by mouth at bedtime.      Tery Sanfilippo Calcium (STOOL SOFTENER PO) Take 1 capsule by mouth daily.        Marland Kitchen donepezil (ARICEPT) 10 MG tablet Take 10 mg by mouth daily.        . fluconazole (DIFLUCAN) 200 MG tablet       . memantine (NAMENDA) 10 MG tablet Take 10 mg by mouth 2 (two) times daily.        . Multiple Vitamin (MULTIVITAMIN) capsule Take 1 capsule by mouth daily.        . nortriptyline (PAMELOR) 25 MG capsule 25 mg at bedtime.       Marland Kitchen omeprazole-sodium bicarbonate (ZEGERID) 40-1100 MG per capsule Take 1 capsule by mouth 2 (two) times daily.       . predniSONE (DELTASONE) 10 MG tablet Take 4 tablets x 2 days then 2 tablets x 2 days then 1 tablet x 2 days then stop  14 tablet  0  . Pregabalin (LYRICA PO) Take 1 tablet by mouth daily. 50mg       . PREMARIN vaginal cream       . Rosuvastatin Calcium (CRESTOR PO) Take 1 tablet by mouth every other day. 10 mg      . zolpidem (AMBIEN CR) 12.5 MG CR tablet Take 12.5 mg by mouth at bedtime.          Physical Examination  Filed Vitals:   10/31/12 1508  BP: 120/57  Pulse: 68  Resp:     There is no height on file to calculate BMI.  General:  WDWN in NAD Gait: Normal HEENT: WNL Eyes: Pupils equal Pulmonary: normal non-labored breathing , without Rales, rhonchi,  wheezing Cardiac: RRR,  without  Murmurs, rubs or gallops; Abdomen: soft, NT, no masses Skin: no rashes, ulcers noted  Vascular Exam Pulses: 3+ radial pulses bilaterally Carotid bruits: Carotid pulses to auscultation no bruits are heard Extremities without ischemic changes, no Gangrene , no cellulitis; no open wounds;  Musculoskeletal: no muscle wasting or atrophy   Neurologic: A&O X 3; Appropriate Affect ; SENSATION: normal; MOTOR FUNCTION:  moving all extremities equally. Speech is fluent/normal  Non-Invasive Vascular Imaging CAROTID DUPLEX 10/31/2012  Right ICA 20 - 39 % stenosis Left ICA 20 - 39 % stenosis   ASSESSMENT/PLAN: Asymptomatic patient with unchanged duplex in the last 18 months. The patient will followup in one year with repeat carotid duplex. The patient's questions were encouraged and answered, she is in agreement with this plan.  Lauree Chandler ANP   Clinic MD: Edilia Bo on call

## 2012-11-03 NOTE — Addendum Note (Signed)
Addended by: Sharee Pimple on: 11/03/2012 08:38 AM   Modules accepted: Orders

## 2013-02-02 ENCOUNTER — Telehealth: Payer: Self-pay | Admitting: Pulmonary Disease

## 2013-02-02 NOTE — Telephone Encounter (Signed)
Spoke with pt's spouse and notified of recs per [facility] He verbalized understanding and states nothing further needed Will call if not improving

## 2013-02-02 NOTE — Telephone Encounter (Signed)
Spoke with pt's spouse He states that the pt's cough has been worse for the past wk Cough is totally non prod Seems to be worse in the early am and then at night No obvious triggers she has noticed that cause the cough She is taking otc cough syrup with no relief Denies any wheezing, SOB, PND, f/c/s, sore throat or other symptoms Offered ov, but spouse states that [facility] told him in the past that this would not be necc Please advise thanks! Allergies  Allergen Reactions  . Bethanechol Chloride     Chest pains. Coronary spasms  . Celecoxib     Celebrex.  Severe itching.   . Citalopram Hydrobromide     Hyper, nervousness  . Clindamycin     Itching, coughing  . Codeine     tachycardia  . Desipramine     Severe tachycardia  . Epinephrine     Severe shortness of breath.   . Erythromycin     Burning, itching  . Floxin (Ocuflox)     itching  . Glucose     Sugar.  Weakness,faintness  . Hydromorphone Hcl     Dilaudid.  Comatose-Anaphylactic reaction.   . Ibuprofen     Tachycardia. weakness  . Ibuprofen     Tachycardia, weakness  . Iodine     Hives, edema, tachycardia  . Meperidine Hcl     Demerol.  Tachycardia, extreme nervousness.   . Morphine     Tachycardia, hallucinations, extreme nervousness.  . Paroxetine Hcl     Extreme nervousness  . Penicillins     Severe edema, severe difficulty breathing, anaphylactic reaction.  . Procaine Hcl     Novocain. Severe shortness of breath.  . Propranolol Hcl     Inderal.  Extreme low blood pressure.   . Red Dye     Severe itching  . Restoril     Temazepam.  Extreme nervousness, sleeplessness  . Scopolamine     Irrational, loss of memory  . Sulfonamide Derivatives     Hives, edema  . Tetracycline     Severe hives, edema  . Trazodone And Nefazodone     Very hyper  . Valium     itching

## 2013-02-02 NOTE — Telephone Encounter (Signed)
Let her know that cough that is worse at night and first thing in am upon arising is usually postnasal drip and/or reflux, as I have discussed with her.  Make sure she is doing this until cough resolves: 1) chlorpheniramine 4mg  at bedtime, and zyrtec 10mg  one each am for next few weeks or until cough resolves 2) increase zegerid to am AND PM dosing until cough is better.  3) limit voice use as much as possible, use hard candy to bathe back of throat. 4) if cough continues, would consider a round of prednisone to see if helps. Let me know if this is not helping.

## 2013-03-05 ENCOUNTER — Other Ambulatory Visit: Payer: Self-pay | Admitting: Adult Health

## 2013-03-24 ENCOUNTER — Telehealth: Payer: Self-pay | Admitting: Pulmonary Disease

## 2013-03-24 NOTE — Telephone Encounter (Signed)
LMOM TCB x1 Last ov w/ [facility] 11.8.13 No rx request in triage or in pt's chart that I can see Hycodan was refilled 3.13.14 thru [facility]

## 2013-03-25 NOTE — Telephone Encounter (Signed)
I verified with the pharmacy that refill is there on file. I advised the pt as well. Carron Curie, CMA

## 2013-05-21 ENCOUNTER — Telehealth: Payer: Self-pay | Admitting: Pulmonary Disease

## 2013-05-21 NOTE — Telephone Encounter (Signed)
Spoke with the pt's spouse He states that she has been coughing a lot the past 3 days Cough is exactly the same as she had when last seen in Nov 2013 It is prod with very minimal sputum that just gets stuck in her throat so she is unsure of color  He states that she is taking otc cough syrup waltussin without relief He wants recs from [facility] only  I offered appt for tomorrow but she only wants to see [facility] They are okay with a call back tomorrow Please advise, thanks!

## 2013-05-22 MED ORDER — PREDNISONE 10 MG PO TABS: ORAL_TABLET | ORAL | Status: DC

## 2013-05-22 NOTE — Telephone Encounter (Signed)
Pt's husband calling again in ref to previous msg.[name]

## 2013-05-22 NOTE — Telephone Encounter (Signed)
Will do what we did last time to see if it helps: 1) increase her acid reflux med to AM AND PM for a few weeks. 2) take chlorpheniramine 4mg , 2 at bedtime each night for a few weeks. 3) prednisone taper:  40mg  qday for 2 days, then 20 for 2 days, then 10 for 2 days, then stop 4) if she can take narcotic cough syrup, hydromet 5 cc q6h prn (6 ounces and no fills).  Please call if she is not better by Mon/tues.

## 2013-05-22 NOTE — Telephone Encounter (Signed)
Pt's husband is aware of [facility] recommendations. Rx for pred will be sent in. He declined the cough syrup, stated they already have that. Nothing further was needed.

## 2013-05-25 ENCOUNTER — Ambulatory Visit (INDEPENDENT_AMBULATORY_CARE_PROVIDER_SITE_OTHER): Payer: Medicare Other | Admitting: Neurology

## 2013-05-25 ENCOUNTER — Encounter: Payer: Self-pay | Admitting: Neurology

## 2013-05-25 ENCOUNTER — Ambulatory Visit: Payer: Medicare Other | Admitting: Neurology

## 2013-05-25 VITALS — BP 110/53 | HR 73 | Temp 97.3°F | Ht 63.0 in | Wt 151.0 lb

## 2013-05-25 DIAGNOSIS — F0280 Dementia in other diseases classified elsewhere without behavioral disturbance: Secondary | ICD-10-CM

## 2013-05-25 NOTE — Progress Notes (Signed)
Subjective:    Patient ID: [name] is a 79 y.o. female.  HPI  Dear Dr. Clelia Croft,  I saw your patient, [name], upon your kind request in my neurologic clinic today for initial consultation of her memory loss. The patient is accompanied by her husband today. As you know, [name] is a very pleasant 79 year old right-handed woman with an underlying medical history of hyperlipidemia, hypertension, bladder cancer, SVT, vitamin D deficiency, osteopenia, syncope, anxiety, asthma, heart disease, cervical spondylosis, reflux disease, who was diagnosed with Alzheimer's disease a few years ago. Current medications are Zyrtec, namenda 10 mg bid, and donepezil 10 mg, Premarin, Ambien CR, Ultram as needed, Crestor, donepezil, atenolol, baby aspirin, multivitamin, Zegerid, amlodipine, Lyrica, nortriptyline.  The patient saw Dr. Sandria Manly in our office in 8/12 for her memory loss. At that time, her MMSE was 23, her CDT was 2/4 and AFT was 6. Her memory loss goes back some 4+ years ago. First, she was started on Aricept and Namenda was started about 1 year ago. They are interested in participating in AD research study.  She has had some confusion and agitation in the late evenings.  She has had no VH and AH, recently, but  She has been married to her second husband for 10 years. She drives locally and has gotten lost a few times. She has been able to tolerate her memory drugs well. She has had no dream enactments. She has not been on an antidepressant. She is irritable at times.    Her Past Medical History Is Significant For: Past Medical History  Diagnosis Date  . Bladder cancer     s/p cystectomy/ureterostomy  . Hypertension   . S/P appendectomy   . GERD (gastroesophageal reflux disease)   . Hyperlipidemia   . Dementia   . SVT (supraventricular tachycardia)   . Vitamin D deficiency   . Carotid stenosis   . Osteopenia   . Syncope   . Anxiety   . Cervical disc disease   . Pyelonephritis    . LBBB (left bundle branch block)   . Ventricular tachycardia   . CAD (coronary artery disease)   . PVC (premature ventricular contraction)   . Headache(784.0)   . Cervical spondylosis   . Heartburn   . Hydronephrosis   . Hypoglycemia   . Rectocele   . Microscopic hematuria   . Heart murmur     Her Past Surgical History Is Significant For: Past Surgical History  Procedure Laterality Date  . Total abdominal hysterectomy w/ bilateral salpingoophorectomy    . Hernia repair    . Cystectomy    . Ureterostomy    . Appendectom    . Cardiac catheterization    . Fracture surgery    . Abdominal hysterectomy      Her Family History Is Significant For: Family History  Problem Relation Age of Onset  . Lung cancer Father   . Cancer Father   . Heart attack Father   . Heart disease Mother     Heart Disease before age 69  . Allergies Mother   . Hypertension Mother   . Heart attack Mother   . Heart disease Sister   . Allergies Sister     Her Social History Is Significant For: History   Social History  . Marital Status: Married    Spouse Name: Leonette Most    Number of Children: Y  . Years of Education: N/A   Occupational History  . homemaker  Social History Main Topics  . Smoking status: Former Smoker -- 0.10 packs/day for 4 years    Types: Cigarettes    Quit date: 12/24/1949  . Smokeless tobacco: None  . Alcohol Use: No  . Drug Use: No  . Sexually Active: Yes    Birth Control/ Protection: Surgical   Other Topics Concern  . None   Social History Narrative  . None    Her Allergies Are:  Allergies  Allergen Reactions  . Bethanechol Chloride     Chest pains. Coronary spasms  . Celecoxib     Celebrex.  Severe itching.   . Citalopram Hydrobromide     Hyper, nervousness  . Clindamycin     Itching, coughing  . Codeine     tachycardia  . Desipramine     Severe tachycardia  . Epinephrine     Severe shortness of breath.   . Erythromycin     Burning, itching   . Floxin (Ocuflox)     itching  . Glucose     Sugar.  Weakness,faintness  . Hydromorphone Hcl     Dilaudid.  Comatose-Anaphylactic reaction.   . Ibuprofen     Tachycardia. weakness  . Ibuprofen     Tachycardia, weakness  . Iodine     Hives, edema, tachycardia  . Meperidine Hcl     Demerol.  Tachycardia, extreme nervousness.   . Morphine     Tachycardia, hallucinations, extreme nervousness.  . Paroxetine Hcl     Extreme nervousness  . Penicillins     Severe edema, severe difficulty breathing, anaphylactic reaction.  . Procaine Hcl     Novocain. Severe shortness of breath.  . Propranolol Hcl     Inderal.  Extreme low blood pressure.   . Red Dye     Severe itching  . Restoril     Temazepam.  Extreme nervousness, sleeplessness  . Scopolamine     Irrational, loss of memory  . Sulfonamide Derivatives     Hives, edema  . Tetracycline     Severe hives, edema  . Trazodone And Nefazodone     Very hyper  . Valium     itching  :   Her Current Medications Are:  Outpatient Encounter Prescriptions as of 05/25/2013  Medication Sig Dispense Refill  . AMLODIPINE BESYLATE PO Take 1 tablet by mouth daily. 5mg       . aspirin 81 MG tablet Take 81 mg by mouth daily.        . ATENOLOL PO Take 1 tablet by mouth 2 (two) times daily. 25mg       . Calcium Carbonate (CALTRATE 600 PO) Take 1 tablet by mouth daily.        Tery Sanfilippo Calcium (STOOL SOFTENER PO) Take 1 capsule by mouth daily.        Marland Kitchen donepezil (ARICEPT) 10 MG tablet Take 10 mg by mouth daily.        Marland Kitchen HYDROcodone-homatropine (HYCODAN) 5-1.5 MG/5ML syrup TAKE 1 TEASPOONFUL BY MOUTH EVERY 6 HOURS AS NEEDED FOR COUGH  240 mL  0  . memantine (NAMENDA) 10 MG tablet Take 10 mg by mouth 2 (two) times daily.        . Multiple Vitamin (MULTIVITAMIN) capsule Take 1 capsule by mouth daily.        . nortriptyline (PAMELOR) 25 MG capsule 25 mg at bedtime.       Marland Kitchen omeprazole-sodium bicarbonate (ZEGERID) 40-1100 MG per capsule Take 1 capsule by  mouth 2 (two) times daily.       Marland Kitchen  predniSONE (DELTASONE) 10 MG tablet Take 4 tablets for 2 days, 3 tablets for 2 days, 2 tablets for 2 days, 1 tablet for 2 days then stop  20 tablet  0  . Pregabalin (LYRICA PO) Take 1 tablet by mouth daily. 50mg       . PREMARIN vaginal cream       . Rosuvastatin Calcium (CRESTOR PO) Take 1 tablet by mouth every other day. 10 mg      . zolpidem (AMBIEN CR) 12.5 MG CR tablet Take 12.5 mg by mouth at bedtime.        . busPIRone (BUSPAR) 7.5 MG tablet Take 7.5 mg by mouth 2 (two) times daily.      . chlorpheniramine (CHLOR-TRIMETON) 4 MG tablet Take 4 mg by mouth at bedtime.      . fluconazole (DIFLUCAN) 200 MG tablet       . [DISCONTINUED] predniSONE (DELTASONE) 10 MG tablet Take 4 tablets x 2 days then 2 tablets x 2 days then 1 tablet x 2 days then stop  14 tablet  0   No facility-administered encounter medications on file as of 05/25/2013.   Review of Systems  Respiratory: Positive for cough.   Neurological:       Memory loss  Psychiatric/Behavioral: Positive for confusion.    Objective:  Neurologic Exam  Physical Exam Physical Examination:   Filed Vitals:   05/25/13 1331  BP: 110/53  Pulse: 73  Temp: 97.3 F (36.3 C)    General Examination: The patient is a very pleasant 80 y.o. female in no acute distress. She is calm and cooperative with the exam. She denies Auditory Hallucinations and Visual Hallucinations.   HEENT: Normocephalic, atraumatic, pupils are equal, round and reactive to light and accommodation. Funduscopic exam is normal with sharp disc margins noted. Extraocular tracking shows mild saccadic breakdown without nystagmus noted. Hearing is impaired. Tympanic membranes are clear bilaterally. Face is symmetric with no facial masking and normal facial sensation. There is no lip, neck or jaw tremor. Neck is not rigid with intact passive and active ROM. There are no carotid bruits on auscultation. Oropharynx exam reveals moderate mouth  dryness. No significant airway crowding is noted. Mallampati is class II. Tongue protrudes centrally and palate elevates symmetrically.    Chest: is clear to auscultation without wheezing, rhonchi or crackles noted.  Heart: sounds are regular and normal without murmurs, rubs or gallops noted.   Abdomen: is soft, non-tender and non-distended with normal bowel sounds appreciated on auscultation.  Extremities: There is no pitting edema in the distal lower extremities bilaterally.   Skin: is warm and dry with no trophic changes noted.  Musculoskeletal: exam reveals no obvious joint deformities, tenderness or joint swelling or erythema.  Neurologically:  Mental status: The patient is awake and alert, paying good  attention. She is able to partially provide the history. Her husband provides details. She is oriented to: person, place, situation, day of week and month of year. Her memory, attention, language and knowledge are impaired. There is no aphasia, agnosia, apraxia or anomia. There is a mild degree of bradyphrenia. Speech is mildly hypophonic with no dysarthria noted. Mood is congruent and affect is blunted.  Her MMSE score is 23/30. CDT is 2/4. AFT (Animal Fluency Test) score is 5.   Cranial nerves are as described above under HEENT exam. In addition, shoulder shrug is normal with equal shoulder height noted.  Motor exam: Normal bulk, and strength for age is noted. Tone is not rigid  with absence of cogwheeling in the extremities. There is overall no bradykinesia. There is no drift or rebound. There is no tremor.   Romberg is negative. Reflexes are 1+ in the upper extremities and trace in the lower extremities. Toes are downgoing bilaterally. Fine motor skills: Finger taps, hand movements, and rapid alternating patting are minimally impaired bilaterally. Foot taps and foot agility are minimally impaired bilaterally.   Cerebellar testing shows no dysmetria or intention tremor on finger to nose  testing. Heel to shin is unremarkable. There is no truncal or gait ataxia.   Sensory exam is intact to light touch, pinprick, vibration, temperature sense and proprioception in the upper and lower extremities.   Gait, station and balance: She stands up from the seated position with no difficulty. No veering to one side is noted. No leaning to one side. Posture is mildly stooped, but appears age-appropriate. Stance is narrow-based. She turns in 3 steps. Tandem walk is possible. Balance is not impaired.     Assessment and Plan:   Assessment and Plan:  In summary, [name] is a very pleasant 78 y.o.-year old female with a 4+ year history of memory loss. Her history and physical exam are keeping with Alzheimer's Disease; late onset (after age 45) without behavioral disturbance, but with mood irritability. Her physical exam and MMSE/CDT/AFT scores are stable when compared to 8/12. I reassured the patient and her husband in that regard.  I had a long chat with the patient and her husband about my findings and the diagnosis of memory loss, its prognosis and treatment options. Implications of diagnosis explained at length with the patient and caregiver. We talked about medical treatments and non-pharmacological approaches. We talked about maintaining a healthy lifestyle in general and staying active mentally and physically. I encouraged the patient to eat healthy, exercise daily and keep well hydrated, to keep a scheduled bedtime and wake time routine, to not skip any meals and eat healthy snacks in between meals and to have protein with every meal. I stressed the importance of regular exercise, within of course the patient's own mobility limitations. I encouraged the patient to keep up with current events by reading the news paper or watching the news.   As far as further diagnostic testing is concerned, I suggested the following: MRI brain without contrast.  As far as medications are concerned, I  recommended the following at this time: no change. She is currently using a number and a 10 mg strength twice daily and is then going to start Namenda her long-acting at 28 mg as prescribed by you. I believe she may be eligible for our Alzheimer's disease expeditions retrial and gave him information material about this and asked him to 3 through this and call our research coordinator [place] back. I answered all their questions today and the patient and her husband were in agreement with the above outlined plan. I would like to see the patient back in 3 months, sooner if the need arises and encouraged them to call with any interim questions, concerns, problems, updates.   Thank you very much for allowing me to participate in the care of this nice patient. If I can be of any further assistance to you please do not hesitate to call me at [phone].  Sincerely,   Huston Foley, MD, PhD

## 2013-05-25 NOTE — Patient Instructions (Addendum)
We will try to enroll you in our Alzheimer's trial. I gave you the consent form for this. I am scheduling you for an MRI brain.   Remember to drink plenty of fluid, eat healthy meals and do not skip any meals. Try to eat protein with a every meal and eat a healthy snack such as fruit or nuts in between meals. Try to keep a regular sleep-wake schedule and try to exercise daily, particularly in the form of walking, 20-30 minutes a day, if you can.   Engage in social activities in your community and with your family and try to keep up with current events by reading the newspaper or watching the news.   As far as your medications are concerned, I would like to suggest    As far as diagnostic testing: MRI brain.  I would like to see you back in 3 months, sooner if we need to. Please call us with any interim questions, concerns, problems, updates or refill requests.  Brett Canales is my clinical assistant and will answer any of your questions and relay your messages to me and also relay most of my messages to you.  Our phone number is [phone]. We also have an after hours call service for urgent matters and there is a physician on-call for urgent questions. For any emergencies you know to call 911 or go to the nearest emergency room.

## 2013-06-01 ENCOUNTER — Other Ambulatory Visit: Payer: Medicare Other

## 2013-06-03 ENCOUNTER — Ambulatory Visit (INDEPENDENT_AMBULATORY_CARE_PROVIDER_SITE_OTHER): Payer: Medicare Other | Admitting: *Deleted

## 2013-06-03 DIAGNOSIS — Z0289 Encounter for other administrative examinations: Secondary | ICD-10-CM

## 2013-06-03 DIAGNOSIS — G309 Alzheimer's disease, unspecified: Secondary | ICD-10-CM

## 2013-06-08 NOTE — Progress Notes (Signed)
Late entry note: Patient was seen for interest in the EXPEDITION 3 trial on 06/04/2023. The informed consent process was performed prior to any study related test or procedures. Patient signed the informed consent and a copy was given to patient for personal record.

## 2013-06-15 ENCOUNTER — Ambulatory Visit
Admission: RE | Admit: 2013-06-15 | Discharge: 2013-06-15 | Disposition: A | Discharge status: Home / Self Care | Payer: Self-pay | Source: Ambulatory Visit | Attending: Neurology | Admitting: Neurology

## 2013-06-15 DIAGNOSIS — G309 Alzheimer's disease, unspecified: Secondary | ICD-10-CM

## 2013-06-16 ENCOUNTER — Ambulatory Visit: Payer: Medicare Other | Admitting: Neurology

## 2013-06-22 ENCOUNTER — Other Ambulatory Visit: Payer: Self-pay | Admitting: Neurology

## 2013-06-22 DIAGNOSIS — G309 Alzheimer's disease, unspecified: Secondary | ICD-10-CM

## 2013-07-02 ENCOUNTER — Inpatient Hospital Stay (HOSPITAL_COMMUNITY)
Admission: RE | Admit: 2013-07-02 | Discharge: 2013-07-02 | Disposition: A | Discharge status: Home / Self Care | Payer: Self-pay | Source: Ambulatory Visit | Attending: Neurology | Admitting: Neurology

## 2013-07-07 ENCOUNTER — Encounter (HOSPITAL_COMMUNITY)
Admission: RE | Admit: 2013-07-07 | Discharge: 2013-07-07 | Disposition: A | Discharge status: Home / Self Care | Payer: Self-pay | Source: Ambulatory Visit | Attending: Neurology | Admitting: Neurology

## 2013-07-07 DIAGNOSIS — G309 Alzheimer's disease, unspecified: Secondary | ICD-10-CM | POA: Insufficient documentation

## 2013-07-07 DIAGNOSIS — F0280 Dementia in other diseases classified elsewhere without behavioral disturbance: Secondary | ICD-10-CM | POA: Insufficient documentation

## 2013-07-07 DIAGNOSIS — Z006 Encounter for examination for normal comparison and control in clinical research program: Secondary | ICD-10-CM | POA: Insufficient documentation

## 2013-07-16 ENCOUNTER — Ambulatory Visit (INDEPENDENT_AMBULATORY_CARE_PROVIDER_SITE_OTHER): Payer: Medicare Other

## 2013-07-16 DIAGNOSIS — Z0289 Encounter for other administrative examinations: Secondary | ICD-10-CM

## 2013-07-30 ENCOUNTER — Ambulatory Visit (INDEPENDENT_AMBULATORY_CARE_PROVIDER_SITE_OTHER): Payer: Medicare Other | Admitting: Internal Medicine

## 2013-07-30 ENCOUNTER — Telehealth: Payer: Self-pay | Admitting: Pulmonary Disease

## 2013-07-30 ENCOUNTER — Encounter: Payer: Self-pay | Admitting: Internal Medicine

## 2013-07-30 VITALS — BP 130/70 | HR 91 | Ht 66.0 in | Wt 148.1 lb

## 2013-07-30 DIAGNOSIS — R059 Cough, unspecified: Secondary | ICD-10-CM

## 2013-07-30 DIAGNOSIS — R05 Cough: Secondary | ICD-10-CM

## 2013-07-30 MED ORDER — PREDNISONE 10 MG PO TABS: ORAL_TABLET | ORAL | Status: DC

## 2013-07-30 NOTE — Assessment & Plan Note (Signed)
Witnessed even consistent with reflux triggered cough and rhinorhea at this visit. Plan- treat as husband requests, based on past experience with her: Prednisone taper, increase PPI to twice daily, increase antihistamine to 2x 4mg  at bedtime. Follow up with Dr Shelle Iron.

## 2013-07-30 NOTE — Patient Instructions (Addendum)
Script sent to [facility] for prednisone taper  Take Zegerid/acid blocker twice daily  Take chlortrimeton 4mg  x 2 at bedtime  Please call as needed  Your doctor might consider trying a shorter-acting sleep med Sonata to see if it affects memory less than [place]

## 2013-07-30 NOTE — Telephone Encounter (Signed)
Called, spoke with pt's husband -  Reports dry cough x 3 days.  No increased SOB, wheezing, chest tightness, or chest pain.  Is taking hycodan with some relief.  OV scheduled today at 40 with Dr. Maple Hudson.  Husband aware.

## 2013-07-30 NOTE — Progress Notes (Signed)
Subjective:    Patient ID: [name], female    DOB: 10-17-1926, 79 y.o.   MRN: [id number] 10/31/12- Cough Associated symptoms include headaches and rhinorrhea. Pertinent negatives include no ear pain, eye redness, fever, postnasal drip, rash, sore throat, shortness of breath or wheezing.   Patient comes in today for an acute sick visit related to chronic cough.  She has a propensity toward cyclical coughing, and she has improved in the past with treatment of allergic rhinitis/postnasal drip as well as reflux.  The husband notes a lot of allergy symptoms through the spring which resulted in escalation of cough through the summer.  This has continued, and they're coming in today for further evaluation.  She is definitely having postnasal drip and sneezing, and has also had increased reflux symptoms most recently.  She denies any mucus production, or change in her exertional tolerance.  She denies shortness of breath.  07/30/13- Acute visit- 3-4 days increased dry cough. Husband here- did most talking. Patient with  Hx Alzheimers. Followed by Dr Shelle Iron for chronic cyclical cough. Current episode began 3-4 days with nos sputum, fever or chest pain. Typical episode, usually responding to prednisone, antihistamine and doubling of acid blocker. CXR 05/01/12 IMPRESSION:  Mild changes of acute bronchitis and/or asthma without localized  pneumonia. Mild cardiomegaly without pulmonary edema.  Original Report Authenticated By: Arnell Sieving, M.D.   Review of Systems  Constitutional: Negative for fever and unexpected weight change.  HENT: Positive for congestion and rhinorrhea. Negative for ear pain, nosebleeds, sore throat, sneezing, trouble swallowing, dental problem, postnasal drip and sinus pressure.   Eyes: Negative for redness and itching.  Respiratory: Positive for cough. Negative for chest tightness, shortness of breath and wheezing.   Cardiovascular: Negative for palpitations and leg  swelling.  Gastrointestinal: Negative for nausea and vomiting.  Genitourinary: Negative for dysuria.  Musculoskeletal: Negative for joint swelling.  Skin: Negative for rash.  Neurological: Positive for headaches.  Hematological: Bruises/bleeds easily.  Psychiatric/Behavioral: Negative for dysphoric mood. The patient is not nervous/anxious.        Objective:   Physical Exam OBJ- Physical Exam General- Alert, Oriented, Affect-appropriate,    //Distress- She had coughing episode while sitting at rest with tearing of eyes, watery nose, hard     cough.    My strong impression that she had refluxed, triggering vasomotor rhinorhea.// Skin- rash-none, lesions- none, excoriation- none Lymphadenopathy- none Head- atraumatic            Eyes- Gross vision intact, PERRLA, conjunctivae and secretions clear            Ears- Hearing, canals-normal            Nose- Clear, no-Septal dev, mucus, polyps, erosion, perforation             Throat- Mallampati II , mucosa clear- not red , drainage- none, tonsils- atrophic Neck- flexible , trachea midline, no stridor , thyroid nl, carotid no bruit Chest - symmetrical excursion , unlabored           Heart/CV- RRR , no murmur , no gallop  , no rub, nl s1 s2                           - JVD- none , edema- none, stasis changes- none, varices- none           Lung- clear to P&A, wheeze- none, cough-+ , dullness-none, rub- none  Chest wall-  Abd- Br/ Gen/ Rectal- Not done, not indicated Extrem- cyanosis- none, clubbing, none, atrophy- none, strength- nl Neuro- grossly intact to observation         Assessment & Plan:

## 2013-08-01 ENCOUNTER — Observation Stay (HOSPITAL_COMMUNITY)
Admission: EM | Admit: 2013-08-01 | Discharge: 2013-08-02 | Disposition: A | Discharge status: Home / Self Care | Payer: Medicare Other | Attending: Cardiology | Admitting: Cardiology

## 2013-08-01 ENCOUNTER — Emergency Department (HOSPITAL_COMMUNITY): Payer: Medicare Other

## 2013-08-01 DIAGNOSIS — R0789 Other chest pain: Principal | ICD-10-CM | POA: Insufficient documentation

## 2013-08-01 DIAGNOSIS — F0390 Unspecified dementia without behavioral disturbance: Secondary | ICD-10-CM | POA: Insufficient documentation

## 2013-08-01 DIAGNOSIS — R011 Cardiac murmur, unspecified: Secondary | ICD-10-CM | POA: Insufficient documentation

## 2013-08-01 DIAGNOSIS — Z79899 Other long term (current) drug therapy: Secondary | ICD-10-CM | POA: Insufficient documentation

## 2013-08-01 DIAGNOSIS — K219 Gastro-esophageal reflux disease without esophagitis: Secondary | ICD-10-CM | POA: Insufficient documentation

## 2013-08-01 DIAGNOSIS — I447 Left bundle-branch block, unspecified: Secondary | ICD-10-CM | POA: Insufficient documentation

## 2013-08-01 DIAGNOSIS — F0280 Dementia in other diseases classified elsewhere without behavioral disturbance: Secondary | ICD-10-CM

## 2013-08-01 DIAGNOSIS — R079 Chest pain, unspecified: Secondary | ICD-10-CM

## 2013-08-01 DIAGNOSIS — I2510 Atherosclerotic heart disease of native coronary artery without angina pectoris: Secondary | ICD-10-CM | POA: Insufficient documentation

## 2013-08-01 DIAGNOSIS — E785 Hyperlipidemia, unspecified: Secondary | ICD-10-CM | POA: Insufficient documentation

## 2013-08-01 DIAGNOSIS — I10 Essential (primary) hypertension: Secondary | ICD-10-CM | POA: Insufficient documentation

## 2013-08-01 NOTE — ED Notes (Signed)
EMS called to house for Pt. C/o Chest pain that started around 2240 tonight prior to their arrival. Lasted about 10 minutes. Family member gave her 4 baby aspirin. When EMS arrived her CP was relieved and has not had any since the initial episode.

## 2013-08-01 NOTE — ED Provider Notes (Signed)
CSN: [id number]     Arrival date & time 08/01/13  2345 History     First MD Initiated Contact with Patient 08/01/13 2347     Chief Complaint  Patient presents with  . Chest Pain    Patient is a 79 y.o. female presenting with chest pain. The history is provided by the patient and a significant other. The history is limited by the condition of the patient.  Chest Pain Pain location:  Substernal area Pain quality: pressure   Pain radiates to:  Upper back Pain radiates to the back: yes   Pain severity:  Moderate Onset quality:  Sudden Duration:  15 minutes Timing:  Constant Progression:  Improving Chronicity:  New Relieved by:  Aspirin Worsened by:  Nothing tried Associated symptoms: shortness of breath   pt presents from home for CP She reports she felt well and was going to bed when she had onset of severe CP She also reports SOB No vomiting.  No syncope She is now feeling improved She does not recall having this pain recently   Patient took an ASA at home prior to arriving in the ED  Past Medical History  Diagnosis Date  . Bladder cancer     s/p cystectomy/ureterostomy  . Hypertension   . S/P appendectomy   . GERD (gastroesophageal reflux disease)   . Hyperlipidemia   . Dementia   . SVT (supraventricular tachycardia)   . Vitamin D deficiency   . Carotid stenosis   . Osteopenia   . Syncope   . Anxiety   . Cervical disc disease   . Pyelonephritis   . LBBB (left bundle branch block)   . Ventricular tachycardia   . CAD (coronary artery disease)   . PVC (premature ventricular contraction)   . Headache(784.0)   . Cervical spondylosis   . Heartburn   . Hydronephrosis   . Hypoglycemia   . Rectocele   . Microscopic hematuria   . Heart murmur    Past Surgical History  Procedure Laterality Date  . Total abdominal hysterectomy w/ bilateral salpingoophorectomy    . Hernia repair    . Cystectomy    . Ureterostomy    . Appendectom    . Cardiac catheterization     . Fracture surgery    . Abdominal hysterectomy     Family History  Problem Relation Age of Onset  . Lung cancer Father   . Cancer Father   . Heart attack Father   . Heart disease Mother     Heart Disease before age 10  . Allergies Mother   . Hypertension Mother   . Heart attack Mother   . Heart disease Sister   . Allergies Sister    History  Substance Use Topics  . Smoking status: Former Smoker -- 0.10 packs/day for 4 years    Types: Cigarettes    Quit date: 12/24/1949  . Smokeless tobacco: Never Used  . Alcohol Use: No   OB History   Grav Para Term Preterm Abortions TAB SAB Ect Mult Living                 Review of Systems  Unable to perform ROS: Dementia  Respiratory: Positive for shortness of breath.   Cardiovascular: Positive for chest pain.    Allergies  Bethanechol chloride; Celecoxib; Citalopram hydrobromide; Clindamycin; Codeine; Desipramine; Epinephrine; Erythromycin; Floxin; Glucose; Hydromorphone hcl; Ibuprofen; Ibuprofen; Iodine; Meperidine hcl; Morphine; Paroxetine hcl; Penicillins; Procaine hcl; Propranolol hcl; Red dye; Restoril; Scopolamine; Sulfonamide derivatives;  Tetracycline; Trazodone and nefazodone; and Valium  Home Medications   Current Outpatient Rx  Name  Route  Sig  Dispense  Refill  . amLODipine (NORVASC) 5 MG tablet   Oral   Take 5 mg by mouth daily.         Marland Kitchen atenolol (TENORMIN) 25 MG tablet   Oral   Take 25 mg by mouth 2 (two) times daily.         Marland Kitchen HYDROcodone-homatropine (HYCODAN) 5-1.5 MG/5ML syrup   Oral   Take 5 mLs by mouth every 6 (six) hours as needed for cough.         . pregabalin (LYRICA) 50 MG capsule   Oral   Take 50 mg by mouth daily.         . rosuvastatin (CRESTOR) 10 MG tablet   Oral   Take 10 mg by mouth every other day.         Marland Kitchen aspirin 81 MG tablet   Oral   Take 81 mg by mouth daily.           . Calcium Carbonate (CALTRATE 600 PO)   Oral   Take 1 tablet by mouth daily.           .  chlorpheniramine (CHLOR-TRIMETON) 4 MG tablet   Oral   Take 4 mg by mouth at bedtime.         Tery Sanfilippo Calcium (STOOL SOFTENER PO)   Oral   Take 1 capsule by mouth daily.           Marland Kitchen donepezil (ARICEPT) 10 MG tablet   Oral   Take 10 mg by mouth daily.           . memantine (NAMENDA) 10 MG tablet   Oral   Take 10 mg by mouth 2 (two) times daily.           . Multiple Vitamin (MULTIVITAMIN) capsule   Oral   Take 1 capsule by mouth daily.           . nortriptyline (PAMELOR) 25 MG capsule   Oral   Take 25 mg by mouth at bedtime.          Marland Kitchen omeprazole-sodium bicarbonate (ZEGERID) 40-1100 MG per capsule   Oral   Take 1 capsule by mouth 2 (two) times daily.          Marland Kitchen PREMARIN vaginal cream               . zolpidem (AMBIEN CR) 12.5 MG CR tablet   Oral   Take 12.5 mg by mouth at bedtime.            BP 146/54  Pulse 64  Temp(Src) 97.7 F (36.5 C) (Oral)  Resp 15  SpO2 97% Physical Exam CONSTITUTIONAL: Well developed/well nourished HEAD: Normocephalic/atraumatic EYES: EOMI/PERRL ENMT: Mucous membranes moist NECK: supple no meningeal signs CV: S1/S2 noted, murmur noted LUNGS: Lungs are clear to auscultation bilaterally, no apparent distress ABDOMEN: soft, nontender, no rebound or guarding Urostomy in place NEURO: Pt is awake/alert, moves all extremitiesx4, pleasantly demented EXTREMITIES: pulses normal, full ROM SKIN: warm, color normal PSYCH: no abnormalities of mood noted  ED Course   Procedures  Labs Reviewed  BASIC METABOLIC PANEL  CBC WITH DIFFERENTIAL   12:09 AM Pt presents from home for CP.  She is currently improving and is well appearing, however she did report severe episode earlier.  Her history is somewhat unreliable (has h/o dementia) and during eval  she denies h/o CAD (chart shows h/o CAD on previous cath) and she reports her cardiologist is in [place] (she has local cardiologist) This did not seem pleuritic, low suspicion for  PE currently.  I doubt acute aortic dissection at this time Imaging/labs ordered.  Will follow closely 12:54 AM Pt improving D/w cardiology dr Mayford Knife, to evaluate patient Pt currently stable Advised patient/family of plan for admit  MDM  Nursing notes including past medical history and social history reviewed and considered in documentation xrays reviewed and considered Labs/vital reviewed and considered Previous records reviewed and considered - h/o CAD per chart on previous cath     Date: 08/01/2013 2344  Rate: 64  Rhythm: normal sinus rhythm  QRS Axis: left  Intervals: normal  ST/T Wave abnormalities: nonspecific ST changes  Conduction Disutrbances:left bundle branch block  Narrative Interpretation:   Old EKG Reviewed: LBBB has been seen previously      Joya Gaskins, MD 08/02/13 [phone]

## 2013-08-02 ENCOUNTER — Encounter (HOSPITAL_COMMUNITY): Payer: Self-pay | Admitting: Emergency Medicine

## 2013-08-02 LAB — CBC WITH DIFFERENTIAL/PLATELET
Basophils Absolute: 0 10*3/uL (ref 0.0–0.1)
Basophils Relative: 0 % (ref 0–1)
Eosinophils Absolute: 0 10*3/uL (ref 0.0–0.7)
MCH: 31.7 pg (ref 26.0–34.0)
MCHC: 33.5 g/dL (ref 30.0–36.0)
Neutrophils Relative %: 58 % (ref 43–77)
Platelets: 150 10*3/uL (ref 150–400)
RBC: 3.56 MIL/uL — ABNORMAL LOW (ref 3.87–5.11)
RDW: 14.3 % (ref 11.5–15.5)

## 2013-08-02 LAB — BASIC METABOLIC PANEL
BUN: 15 mg/dL (ref 6–23)
CO2: 27 mEq/L (ref 19–32)
Calcium: 9.4 mg/dL (ref 8.4–10.5)
Calcium: 9.5 mg/dL (ref 8.4–10.5)
Creatinine, Ser: 0.58 mg/dL (ref 0.50–1.10)
GFR calc non Af Amer: 79 mL/min — ABNORMAL LOW (ref >90)
GFR calc non Af Amer: 82 mL/min — ABNORMAL LOW (ref >90)
Glucose, Bld: 100 mg/dL — ABNORMAL HIGH (ref 70–99)
Glucose, Bld: 112 mg/dL — ABNORMAL HIGH (ref 70–99)
Sodium: 137 mEq/L (ref 135–145)
Sodium: 140 mEq/L (ref 135–145)

## 2013-08-02 LAB — CBC
Hemoglobin: 11.6 g/dL — ABNORMAL LOW (ref 12.0–15.0)
MCH: 31.6 pg (ref 26.0–34.0)
MCHC: 33.4 g/dL (ref 30.0–36.0)
MCV: 94.6 fL (ref 78.0–100.0)
RBC: 3.67 MIL/uL — ABNORMAL LOW (ref 3.87–5.11)

## 2013-08-02 LAB — COMPREHENSIVE METABOLIC PANEL
ALT: 14 U/L (ref 0–35)
AST: 15 U/L (ref 0–37)
Albumin: 3.3 g/dL — ABNORMAL LOW (ref 3.5–5.2)
CO2: 29 mEq/L (ref 19–32)
Calcium: 9.3 mg/dL (ref 8.4–10.5)
Chloride: 103 mEq/L (ref 96–112)
GFR calc non Af Amer: 81 mL/min — ABNORMAL LOW (ref >90)
Sodium: 137 mEq/L (ref 135–145)
Total Bilirubin: 0.2 mg/dL — ABNORMAL LOW (ref 0.3–1.2)

## 2013-08-02 LAB — POCT I-STAT TROPONIN I: Troponin i, poc: 0 ng/mL (ref 0.00–0.08)

## 2013-08-02 LAB — APTT: aPTT: >200 seconds (ref 24–37)

## 2013-08-02 LAB — TSH: TSH: 1.897 u[IU]/mL (ref 0.350–4.500)

## 2013-08-02 MED ORDER — ASPIRIN EC 81 MG PO TBEC
81.0000 mg | DELAYED_RELEASE_TABLET | Freq: Every day | ORAL | Status: DC
  Administered 2013-08-02: 81 mg via ORAL
  Filled 2013-08-02: qty 1

## 2013-08-02 MED ORDER — MEMANTINE HCL 10 MG PO TABS
10.0000 mg | ORAL_TABLET | Freq: Two times a day (BID) | ORAL | Status: DC
  Administered 2013-08-02: 10 mg via ORAL
  Filled 2013-08-02 (×2): qty 1

## 2013-08-02 MED ORDER — ZOLPIDEM TARTRATE 5 MG PO TABS: 5.0000 mg | ORAL_TABLET | Freq: Every evening | ORAL | Status: DC | PRN

## 2013-08-02 MED ORDER — NITROGLYCERIN 0.4 MG SL SUBL
0.4000 mg | SUBLINGUAL_TABLET | SUBLINGUAL | Status: DC | PRN
  Administered 2013-08-02: 0.4 mg via SUBLINGUAL
  Filled 2013-08-02: qty 25

## 2013-08-02 MED ORDER — DONEPEZIL HCL 10 MG PO TABS
10.0000 mg | ORAL_TABLET | Freq: Every day | ORAL | Status: DC
  Administered 2013-08-02: 10 mg via ORAL
  Filled 2013-08-02: qty 1

## 2013-08-02 MED ORDER — NORTRIPTYLINE HCL 25 MG PO CAPS
25.0000 mg | ORAL_CAPSULE | Freq: Every day | ORAL | Status: DC
  Filled 2013-08-02 (×2): qty 1

## 2013-08-02 MED ORDER — ATENOLOL 25 MG PO TABS
25.0000 mg | ORAL_TABLET | Freq: Two times a day (BID) | ORAL | Status: DC
  Administered 2013-08-02: 25 mg via ORAL
  Filled 2013-08-02 (×2): qty 1

## 2013-08-02 MED ORDER — NITROGLYCERIN IN D5W 200-5 MCG/ML-% IV SOLN
3.0000 ug/min | INTRAVENOUS | Status: DC
  Administered 2013-08-02: 5 ug/min via INTRAVENOUS
  Filled 2013-08-02: qty 250

## 2013-08-02 MED ORDER — AMLODIPINE BESYLATE 5 MG PO TABS
5.0000 mg | ORAL_TABLET | Freq: Every day | ORAL | Status: DC
  Administered 2013-08-02: 5 mg via ORAL
  Filled 2013-08-02: qty 1

## 2013-08-02 MED ORDER — PANTOPRAZOLE SODIUM 40 MG PO TBEC: 80.0000 mg | DELAYED_RELEASE_TABLET | Freq: Every day | ORAL | Status: DC

## 2013-08-02 MED ORDER — PREDNISONE 20 MG PO TABS
20.0000 mg | ORAL_TABLET | Freq: Every day | ORAL | Status: DC
  Filled 2013-08-02: qty 1

## 2013-08-02 MED ORDER — ATORVASTATIN CALCIUM 10 MG PO TABS
10.0000 mg | ORAL_TABLET | Freq: Every day | ORAL | Status: DC
  Filled 2013-08-02: qty 1

## 2013-08-02 MED ORDER — PNEUMOCOCCAL VAC POLYVALENT 25 MCG/0.5ML IJ INJ: 0.5000 mL | INJECTION | INTRAMUSCULAR | Status: DC

## 2013-08-02 MED ORDER — PREDNISONE 20 MG PO TABS
30.0000 mg | ORAL_TABLET | Freq: Every day | ORAL | Status: AC
  Administered 2013-08-02: 30 mg via ORAL
  Filled 2013-08-02: qty 1

## 2013-08-02 MED ORDER — ONDANSETRON HCL 4 MG/2ML IJ SOLN: 4.0000 mg | Freq: Four times a day (QID) | INTRAMUSCULAR | Status: DC | PRN

## 2013-08-02 MED ORDER — PREGABALIN 50 MG PO CAPS
50.0000 mg | ORAL_CAPSULE | Freq: Every day | ORAL | Status: DC
  Administered 2013-08-02: 50 mg via ORAL
  Filled 2013-08-02: qty 1

## 2013-08-02 MED ORDER — NITROGLYCERIN 0.4 MG SL SUBL: 0.4000 mg | SUBLINGUAL_TABLET | SUBLINGUAL | Status: DC | PRN

## 2013-08-02 MED ORDER — PREDNISONE 10 MG PO TABS: 10.0000 mg | ORAL_TABLET | Freq: Every day | ORAL | Status: DC

## 2013-08-02 MED ORDER — ACETAMINOPHEN 325 MG PO TABS
650.0000 mg | ORAL_TABLET | ORAL | Status: DC | PRN
  Administered 2013-08-02: 650 mg via ORAL
  Filled 2013-08-02: qty 2

## 2013-08-02 MED ORDER — HEPARIN BOLUS VIA INFUSION
4000.0000 [IU] | Freq: Once | INTRAVENOUS | Status: AC
  Administered 2013-08-02: 4000 [IU] via INTRAVENOUS
  Filled 2013-08-02: qty 4000

## 2013-08-02 MED ORDER — HEPARIN (PORCINE) IN NACL 100-0.45 UNIT/ML-% IJ SOLN
800.0000 [IU]/h | INTRAMUSCULAR | Status: DC
  Administered 2013-08-02: 800 [IU]/h via INTRAVENOUS
  Filled 2013-08-02: qty 250

## 2013-08-02 NOTE — Progress Notes (Signed)
At 0400 pt continuing to c/o headache. Pt is receiving IV NTG and not time for further pain med. Dr. Mayford Knife made aware and ordered to d/c NTG gtt. Gtt d/ced and pt given and ice pack for head. Will cont to monitor.

## 2013-08-02 NOTE — H&P (Signed)
Admit date: 08/01/2013 Referring Physician Dr. Bebe Shaggy Primary Cardiologist Dr. Donnie Aho Chief complaint/reason for admission: Chest pain  HPI: This is an 79yo WF with a history of nonobstructive ASCAD up to 40% by cath in 2008, HTN, GERD, dyslipidemia, dementia and LBBB who was in her USOH until she went to bed.  She was lying in bed and had severe substernal chest pain radiating to her back.  She rated it as a 10/10 in severity.  She took 4 baby ASA.  She denied any SOB, nausea or diaphoresis.  She called EMS and was brought to the ER.  Currently she denies any chest pain but has some mild pressure.    PMH:    Past Medical History  Diagnosis Date  . Bladder cancer     s/p cystectomy/ureterostomy  . Hypertension   . S/P appendectomy   . GERD (gastroesophageal reflux disease)   . Hyperlipidemia   . Dementia   . SVT (supraventricular tachycardia)   . Vitamin D deficiency   . Carotid stenosis   . Osteopenia   . Syncope   . Anxiety   . Cervical disc disease   . Pyelonephritis   . LBBB (left bundle branch block)   . Ventricular tachycardia   . CAD (coronary artery disease)   . PVC (premature ventricular contraction)   . Headache(784.0)   . Cervical spondylosis   . Heartburn   . Hydronephrosis   . Hypoglycemia   . Rectocele   . Microscopic hematuria   . Heart murmur     PSH:    Past Surgical History  Procedure Laterality Date  . Total abdominal hysterectomy w/ bilateral salpingoophorectomy    . Hernia repair    . Cystectomy    . Ureterostomy    . Appendectom    . Cardiac catheterization    . Fracture surgery    . Abdominal hysterectomy      ALLERGIES:   Bethanechol chloride; Celecoxib; Citalopram hydrobromide; Clindamycin; Codeine; Desipramine; Epinephrine; Erythromycin; Floxin; Glucose; Hydromorphone hcl; Ibuprofen; Ibuprofen; Iodine; Meperidine hcl; Morphine; Paroxetine hcl; Penicillins; Procaine hcl; Propranolol hcl; Red dye; Restoril; Scopolamine; Sulfonamide  derivatives; Tetracycline; Trazodone and nefazodone; and Valium  Prior to Admit Meds:   (Not in a hospital admission) Family HX:    Family History  Problem Relation Age of Onset  . Lung cancer Father   . Cancer Father   . Heart attack Father   . Heart disease Mother     Heart Disease before age 2  . Allergies Mother   . Hypertension Mother   . Heart attack Mother   . Heart disease Sister   . Allergies Sister    Social HX:    History   Social History  . Marital Status: Married    Spouse Name: Leonette Most    Number of Children: Y  . Years of Education: N/A   Occupational History  . homemaker    Social History Main Topics  . Smoking status: Former Smoker -- 0.10 packs/day for 4 years    Types: Cigarettes    Quit date: 12/24/1949  . Smokeless tobacco: Never Used  . Alcohol Use: No  . Drug Use: No  . Sexually Active: Yes    Birth Control/ Protection: Surgical   Other Topics Concern  . Not on file   Social History Narrative  . No narrative on file     ROS:  All 11 ROS were addressed and are negative except what is stated in the HPI  PHYSICAL EXAM  Filed Vitals:   08/01/13 2346  BP: 146/54  Pulse: 64  Temp: 97.7 F (36.5 C)  Resp: 15   General: Well developed, well nourished, in no acute distress Head: Eyes PERRLA, No xanthomas.   Normal cephalic and atramatic  Lungs:   Clear bilaterally to auscultation and percussion. Heart:   HRRR S1 S2 Pulses are 2+ & equal.2/6 SM at RUSB            No carotid bruit. No JVD.  No abdominal bruits. No femoral bruits. Abdomen: Bowel sounds are positive, abdomen soft and non-tender without masses Extremities:   No clubbing, cyanosis or edema.  DP +1 Neuro: Alert and oriented X 3. Psych:  Good affect, responds appropriately   Labs:   Lab Results  Component Value Date   WBC 6.8 08/02/2013   HGB 11.3* 08/02/2013   HCT 33.7* 08/02/2013   MCV 94.7 08/02/2013   PLT 150 08/02/2013    Recent Labs Lab 08/02/13 0006  NA 137  K  4.7  CL 102  CO2 28  BUN 15  CREATININE 0.67  CALCIUM 9.5  GLUCOSE 100*   Lab Results  Component Value Date   CKTOTAL 23 10/08/2007   CKMB 0.8 10/08/2007   TROPONINI  Value: <0.01        NO INDICATION OF MYOCARDIAL INJURY. 10/08/2007   No results found for this basename: PTT   Lab Results  Component Value Date   INR 1.2 07/22/2008   INR 1.0 06/06/2008   INR 1.1 10/11/2007     Lab Results  Component Value Date   CHOL  Value: 177        ATP III CLASSIFICATION:  <200     mg/dL   Desirable  161-096  mg/dL   Borderline High  >=045    mg/dL   High 40/98/1191   Lab Results  Component Value Date   HDL 58 10/08/2007   Lab Results  Component Value Date   LDLCALC  Value: 89        Total Cholesterol/HDL:CHD Risk Coronary Heart Disease Risk Table                     Men   Women  1/2 Average Risk   3.4   3.3 10/08/2007   Lab Results  Component Value Date   TRIG 151* 10/08/2007   Lab Results  Component Value Date   CHOLHDL 3.1 10/08/2007   No results found for this basename: LDLDIRECT      Radiology: *RADIOLOGY REPORT*  Clinical Data: Chest pain.  PORTABLE CHEST - 1 VIEW  Comparison: Chest radiograph performed 05/01/2012  Findings: The lungs are well-aerated and clear. There is no  evidence of focal opacification, pleural effusion or pneumothorax.  The cardiomediastinal silhouette is within normal limits. No acute  osseous abnormalities are seen.  IMPRESSION:  No acute cardiopulmonary process seen.  Original Report Authenticated By: Tonia Ghent, M.D.   EKG: NSR with LBBB  ASSESSMENT:  1.  Chest pain ? Etiology - coronary ischemia vs. GERD.  She has a history of nonobstructive ASCAD by cath 2008.  Initial troponin is normal. 2.  Nonobstructive ASCAD by cath 2008 3.  HTN 4.  Dementia 5.  Chronic LBBB 6.  H/O GERD 7.  Systolic heart murmur  PLAN:   1.  Admit to tele bed 2.  Cycle cardiac enzymes 3. Continue beta blocker/ASA/statin 4.  IV NTG gtt 5.  IV Heparin  gtt 6.  Further w/u  per Dr. Donnie Aho 7.  2D echo in am to assess heart murmu  Quintella Reichert, MD  08/02/2013  12:53 AM

## 2013-08-02 NOTE — Discharge Summary (Signed)
Physician Discharge Summary  Patient ID: [name] MRN: [id number] DOB/AGE: [birth year] 77 y.o.  Admit date: 08/01/2013 Discharge date: 08/02/2013  Admission Diagnoses:  chest pain  Discharge Diagnoses:  Atypical chest pain, possibly pill-induced esophagitis from prednisone, GERD, nonobstructive CAD, heart murmur, hypertension, dementia, prior SVT, carotid stenosis   Discharged Condition: good  Hospital Course: 79 year old with history of nonobstructive coronary artery disease, 40% stenosis by catheterization in 2008 with dementia, hypertension, GERD, dyslipidemia, left bundle branch block who presented yesterday to the emergency department after laying in bed having severe substernal chest pain which she stated radiated to her back, 10 over 10 in severity. She took aspirin. No associated nausea, diaphoresis or shortness of breath.  In review of her records, I do see that she has been on a prednisone taper. Perhaps this is exacerbating esophagitis for instance causing her discomfort.  Thankfully, troponins have all been normal, EKG unchanged left bundle branch block.  A murmur was appreciated on exam and can be further evaluated as an outpatient if necessary, if Dr. Donnie Aho does not have a recent echocardiogram.  Her husband and daughter were present in room this morning and all of them including [name] were requesting discharge home. I think that this is very reasonable. She is currently sitting on side in bed, chest pain-free. She was confused about her location, thinking that she lives in [place] but her husband corrected her. She also wears a colostomy bag.   Discharge Exam: Blood pressure 120/80, pulse 67, temperature 97.5 F (36.4 C), temperature source Oral, resp. rate 18, height 5\' 6"  (1.676 m), weight 67.2 kg (148 lb 2.4 oz), SpO2 97.00%.  General: She is alert, confused at times but in no distress. Very pleasant. Cardiovascular: Regular rate and rhythm ,  2/6 systolic murmur heard best at right upper sternal border Lungs: Clear to auscultation bilaterally no wheezes Abdomen: Soft, nontender, positive bowel sounds, colostomy bag Extremities: Trace pitting edema bilaterally Neurologic: Moves all extremities, ambulates well.  Disposition: 01-Home or Self Care  Discharge Orders   Future Appointments Provider Department Dept Phone   08/06/2013 2:00 PM [facility] [phone]   08/25/2013 3:00 PM Huston Foley, MD [facility] [phone]   11/03/2013 2:30 PM [facility] [phone]   11/03/2013 3:40 PM Evern Bio, NP [facility] [phone]   Future Orders Complete By Expires     Diet - low sodium heart healthy  As directed     Increase activity slowly  As directed         Medication List         amLODipine 5 MG tablet  Commonly known as:  NORVASC  Take 5 mg by mouth daily.     aspirin 81 MG tablet  Take 81 mg by mouth daily.     atenolol 25 MG tablet  Commonly known as:  TENORMIN  Take 25 mg by mouth 2 (two) times daily.     CALTRATE 600 PO  Take 1 tablet by mouth daily.     chlorpheniramine 4 MG tablet  Commonly known as:  CHLOR-TRIMETON  Take 4 mg by mouth at bedtime.     donepezil 10 MG tablet  Commonly known as:  ARICEPT  Take 10 mg by mouth daily.     esomeprazole 40 MG capsule  Commonly known as:  NEXIUM  Take 40 mg by mouth daily before breakfast.     HYDROcodone-homatropine 5-1.5  MG/5ML syrup  Commonly known as:  HYCODAN  Take 5 mLs by mouth every 6 (six) hours as needed for cough.     memantine 10 MG tablet  Commonly known as:  NAMENDA  Take 10 mg by mouth 2 (two) times daily.     multivitamin capsule  Take 1 capsule by mouth daily.     nortriptyline 25 MG capsule  Commonly known as:  PAMELOR  Take 25 mg by mouth at bedtime.     predniSONE 10 MG tablet  Commonly known  as:  DELTASONE  Take 10-40 mg by mouth daily. Take 4 tablets for 2 days, 3 tablets for 2 days, 2 tablets for 2 days, 1 tablet for 2 days then stop. START 8.7.14 end 8.15.14     pregabalin 50 MG capsule  Commonly known as:  LYRICA  Take 50 mg by mouth daily.     PREMARIN vaginal cream  Generic drug:  conjugated estrogens  Place 1 g vaginally daily.     rosuvastatin 10 MG tablet  Commonly known as:  CRESTOR  Take 10 mg by mouth every Monday, Wednesday, and Friday.     STOOL SOFTENER PO  Take 1 capsule by mouth daily.     zolpidem 12.5 MG CR tablet  Commonly known as:  AMBIEN CR  Take 12.5 mg by mouth at bedtime.           Follow-up Information   Follow up with TILLEY JR,W SPENCER, MD. Schedule an appointment as soon as possible for a visit in 1 week.   Contact information:   [address] [place] [address] [phone]      Greater than 35 minutes spent with patient, education, review of medical records, labs.  I have not made any changes to her home medication regimen. Once again according to her her medication regimen she is ending a prednisone taper on 08/07/13. This certainly could have caused her symptoms, that is pill-induced esophagitis. Currently her symptoms have resolved. Continue with proton pump inhibitor as she is taking at home. Make sure to drink plenty of liquids with her prednisone.  I have relayed to them that Dr. Donnie Aho will be out of the office this week. I have asked him to schedule an appointment when he returns. They understand that if symptoms worsen or become more worrisome, further evaluation may be warranted. They understand to seek medical attention if necessary.  SignedDonato Schultz 08/02/2013, 10:20 AM

## 2013-08-02 NOTE — Progress Notes (Signed)
ANTICOAGULATION CONSULT NOTE - Initial Consult  Pharmacy Consult for Heparin Indication: chest pain/ACS  Allergies  Allergen Reactions  . Bethanechol Chloride     Chest pains. Coronary spasms  . Celecoxib     Celebrex.  Severe itching.   . Citalopram Hydrobromide     Hyper, nervousness  . Clindamycin     Itching, coughing  . Codeine     tachycardia  . Desipramine     Severe tachycardia  . Epinephrine     Severe shortness of breath.   . Erythromycin     Burning, itching  . Floxin (Ocuflox)     itching  . Glucose     Sugar.  Weakness,faintness  . Hydromorphone Hcl     Dilaudid.  Comatose-Anaphylactic reaction.   . Ibuprofen     Tachycardia. weakness  . Ibuprofen     Tachycardia, weakness  . Iodine     Hives, edema, tachycardia  . Meperidine Hcl     Demerol.  Tachycardia, extreme nervousness.   . Morphine     Tachycardia, hallucinations, extreme nervousness.  . Paroxetine Hcl     Extreme nervousness  . Penicillins     Severe edema, severe difficulty breathing, anaphylactic reaction.  . Procaine Hcl     Novocain. Severe shortness of breath.  . Propranolol Hcl     Inderal.  Extreme low blood pressure.   . Red Dye     Severe itching  . Restoril     Temazepam.  Extreme nervousness, sleeplessness  . Scopolamine     Irrational, loss of memory  . Sulfonamide Derivatives     Hives, edema  . Tetracycline     Severe hives, edema  . Trazodone And Nefazodone     Very hyper  . Valium     itching    Patient Measurements: Weight: 148 lb 2.4 oz (67.2 kg)  Vital Signs: Temp: 97.6 F (36.4 C) (08/10 0211) Temp src: Oral (08/10 0211) BP: 137/50 mmHg (08/10 0211) Pulse Rate: 61 (08/10 0211)  Labs:  Recent Labs  08/02/13 0006  HGB 11.3*  HCT 33.7*  PLT 150  CREATININE 0.67    The CrCl is unknown because both a height and weight (above a minimum accepted value) are required for this calculation.   Medical History: Past Medical History  Diagnosis Date   . Bladder cancer     s/p cystectomy/ureterostomy  . Hypertension   . S/P appendectomy   . GERD (gastroesophageal reflux disease)   . Hyperlipidemia   . Dementia   . SVT (supraventricular tachycardia)   . Vitamin D deficiency   . Carotid stenosis   . Osteopenia   . Syncope   . Anxiety   . Cervical disc disease   . Pyelonephritis   . LBBB (left bundle branch block)   . Ventricular tachycardia   . CAD (coronary artery disease)   . PVC (premature ventricular contraction)   . Headache(784.0)   . Cervical spondylosis   . Heartburn   . Hydronephrosis   . Hypoglycemia   . Rectocele   . Microscopic hematuria   . Heart murmur     Medications:  Prescriptions prior to admission  Medication Sig Dispense Refill  . amLODipine (NORVASC) 5 MG tablet Take 5 mg by mouth daily.      Marland Kitchen aspirin 81 MG tablet Take 81 mg by mouth daily.        Marland Kitchen atenolol (TENORMIN) 25 MG tablet Take 25 mg by mouth 2 (two) times daily.      Marland Kitchen  Calcium Carbonate (CALTRATE 600 PO) Take 1 tablet by mouth daily.        . chlorpheniramine (CHLOR-TRIMETON) 4 MG tablet Take 4 mg by mouth at bedtime.      Tery Sanfilippo Calcium (STOOL SOFTENER PO) Take 1 capsule by mouth daily.        Marland Kitchen donepezil (ARICEPT) 10 MG tablet Take 10 mg by mouth daily.        Marland Kitchen esomeprazole (NEXIUM) 40 MG capsule Take 40 mg by mouth daily before breakfast.      . HYDROcodone-homatropine (HYCODAN) 5-1.5 MG/5ML syrup Take 5 mLs by mouth every 6 (six) hours as needed for cough.      . memantine (NAMENDA) 10 MG tablet Take 10 mg by mouth 2 (two) times daily.        . Multiple Vitamin (MULTIVITAMIN) capsule Take 1 capsule by mouth daily.        . nortriptyline (PAMELOR) 25 MG capsule Take 25 mg by mouth at bedtime.       . predniSONE (DELTASONE) 10 MG tablet Take 10-40 mg by mouth daily. Take 4 tablets for 2 days, 3 tablets for 2 days, 2 tablets for 2 days, 1 tablet for 2 days then stop. START 8.7.14 end 8.15.14      . pregabalin (LYRICA) 50 MG capsule  Take 50 mg by mouth daily.      Marland Kitchen PREMARIN vaginal cream Place 1 g vaginally daily.       . rosuvastatin (CRESTOR) 10 MG tablet Take 10 mg by mouth every Monday, Wednesday, and Friday.       . zolpidem (AMBIEN CR) 12.5 MG CR tablet Take 12.5 mg by mouth at bedtime.         Assessment: 79 yo female with chest pain for heparin Goal of Therapy:  Heparin level 0.3-0.7 units/ml Monitor platelets by anticoagulation protocol: Yes   Plan:  Heparin 4000 units IV bolus, then 800 units/hr Check heparin level in 8 hours.  Mickle Campton, Gary Fleet 08/02/2013,2:14 AM

## 2013-08-05 ENCOUNTER — Encounter (INDEPENDENT_AMBULATORY_CARE_PROVIDER_SITE_OTHER): Payer: Medicare Other

## 2013-08-05 DIAGNOSIS — Z0289 Encounter for other administrative examinations: Secondary | ICD-10-CM

## 2013-08-21 ENCOUNTER — Encounter: Payer: Self-pay | Admitting: Neurology

## 2013-08-25 ENCOUNTER — Ambulatory Visit: Payer: Medicare Other | Admitting: Neurology

## 2013-09-17 ENCOUNTER — Encounter (INDEPENDENT_AMBULATORY_CARE_PROVIDER_SITE_OTHER): Payer: Self-pay

## 2013-09-17 DIAGNOSIS — Z0289 Encounter for other administrative examinations: Secondary | ICD-10-CM

## 2013-10-05 ENCOUNTER — Telehealth: Payer: Self-pay | Admitting: Pulmonary Disease

## 2013-10-05 NOTE — Telephone Encounter (Signed)
LMTCB-need to let patients husband know that once [facility] approves the refill that they will need to come and pick up the RX since the law changed.

## 2013-10-06 MED ORDER — HYDROCODONE-HOMATROPINE 5-1.5 MG/5ML PO SYRP: 5.0000 mL | ORAL_SOLUTION | Freq: Four times a day (QID) | ORAL | Status: DC | PRN

## 2013-10-06 NOTE — Telephone Encounter (Signed)
Called spoke with patient's spouse Billey Gosling who reported that she is still having a non productive cough and that the Hycodan cough syrup helps her with this.  Denies SOB, congestion, mucus production, wheezing, chest tightness, f/c/s.  Unable to come in for ov today or tomorrow as spouse will be having surgery tomorrow morning.  Billey Gosling is aware that if [facility] approves this refill, someone will need to come to the office to pick up the rx as per new classification guidelines.  Last ov was 8.7.14 w/ CY for acute visit Last refill of the Hycodan was 3.13.14 by TP, originally given at 2.19.13 ov w/ TP [facility] Allergies  Allergen Reactions  . Bethanechol Chloride     Chest pains. Coronary spasms  . Celecoxib     Celebrex.  Severe itching.   . Citalopram Hydrobromide     Hyper, nervousness  . Clindamycin     Itching, coughing  . Codeine     tachycardia  . Desipramine     Severe tachycardia  . Epinephrine     Severe shortness of breath.   . Erythromycin     Burning, itching  . Floxin [Ocuflox]     itching  . Glucose     Sugar.  Weakness,faintness  . Hydromorphone Hcl     Dilaudid.  Comatose-Anaphylactic reaction.   . Ibuprofen     Tachycardia. weakness  . Ibuprofen     Tachycardia, weakness  . Iodine     Hives, edema, tachycardia  . Meperidine Hcl     Demerol.  Tachycardia, extreme nervousness.   . Morphine     Tachycardia, hallucinations, extreme nervousness.  . Paroxetine Hcl     Extreme nervousness  . Penicillins     Severe edema, severe difficulty breathing, anaphylactic reaction.  . Procaine Hcl     Novocain. Severe shortness of breath.  . Propranolol Hcl     Inderal.  Extreme low blood pressure.   . Red Dye     Severe itching  . Restoril     Temazepam.  Extreme nervousness, sleeplessness  . Scopolamine     Irrational, loss of memory  . Sulfonamide Derivatives     Hives, edema  . Tetracycline     Severe hives, edema  . Trazodone And Nefazodone     Very  hyper  . Valium     itching   Dr Shelle Iron please advise, thank you.

## 2013-10-06 NOTE — Telephone Encounter (Signed)
Called, spoke with pt's spouse.  Informed him of below per [facility].  He verbalized understanding of this and would like to pick up rx shortly.  I have printed this and given to Ashtyn to have KC sign soon.  We have scheduled pt to see [facility] for f/u on Nov 18 at 1:45 pm.  Spouse aware.  Ashtyn, pt's spouse is aware we are having KC sign rx and will place at front.  Will you pls document once this has been signed and put upfront, please?  Thank you.

## 2013-10-06 NOTE — Telephone Encounter (Signed)
Ok to fill #6ounces, no fills.  I should probably see her by the end of the year.

## 2013-10-06 NOTE — Telephone Encounter (Signed)
Rx signed and placed up front ready for p/u.

## 2013-10-13 ENCOUNTER — Encounter: Payer: Self-pay | Admitting: Internal Medicine

## 2013-10-13 ENCOUNTER — Telehealth: Payer: Self-pay | Admitting: Pulmonary Disease

## 2013-10-13 ENCOUNTER — Ambulatory Visit (INDEPENDENT_AMBULATORY_CARE_PROVIDER_SITE_OTHER): Payer: Medicare Other | Admitting: Internal Medicine

## 2013-10-13 VITALS — BP 120/72 | HR 86 | Ht 66.0 in | Wt 148.4 lb

## 2013-10-13 DIAGNOSIS — R05 Cough: Secondary | ICD-10-CM

## 2013-10-13 MED ORDER — PREDNISONE 10 MG PO TABS: ORAL_TABLET | ORAL | Status: DC

## 2013-10-13 NOTE — Patient Instructions (Signed)
Unclear why cough is worse Continue all regular medications Take prednisone 40 mg daily x 2 days, then 20mg  daily x 2 days, then 10mg  daily x 2 days, then 5mg  daily x 2 days and stop IF develops fever, sore throat getting worse, colored phlegm - call us for antibiotics Follow with Dr Shelle Iron next month

## 2013-10-13 NOTE — Telephone Encounter (Signed)
Called, spoke with pt's husband.  Reports pt has a "terrible" cough.  Cough is nonprod and started Friday.  Cough is all during the day and qhs.  Also, throat is sore.  No increased SOB, wheezing, chest tightness, or chest pain.  Is using hycodan but is still coughing.  We have scheduled pt to see MR today at 10 am.  Pt husband ok with this and voiced no further questions or concerns at this time.

## 2013-10-13 NOTE — Progress Notes (Signed)
Subjective:    Patient ID: [name], female    DOB: 1926/08/23, 79 y.o.   MRN: [id number]  HPI   Acute office visit 10/13/2013  Patient of Dr Marcelyn Bruins presents acutely with her husband for evaluation of acute on worsening chronic cough  Patient has dementia and therefore husband gives most of the history. According to the husband patient has chronic cough. Review of the records reviewed reveals that patient has irritable larynx syndrome or LPR cough. Patient's husband says that periodically patient will have a flareup for unclear reasons and will have to be treated with prednisone. For the year 2014 he reckons that patient has had 2-3 courses of prednisone. Most recently in the last few to several days patient has had worsening cough that is rated as severe, dry associated with postnasal drainage and gag. This is despite taking all chronic medications for multifactorial cough. Denies any fever, sore throat, ear pain, yellow phlegm or any signs of infection. Husband thinks that another short course of prednisone will help the cough. Review of records reveal that they've not seen Dr. Shelle Iron in nearly a year   Dr Gretta Cool Reflux Symptom Index (> 13-15 suggestive of LPR cough) 0 -> 5  =  none ->severe problem  Hoarseness of problem with voice 1  Clearing  Of Throat 5  Excess throat mucus or feeling of post nasal drip 2  Difficulty swallowing food, liquid or tablets 1  Cough after eating or lying down 4  Breathing difficulties or choking episodes 1  Troublesome or annoying cough 5  Sensation of something sticking in throat or lump in throat 1  Heartburn, chest pain, indigestion, or stomach acid coming up 1  TOTAL 21      has a past medical history of Bladder cancer; Hypertension; S/P appendectomy; GERD (gastroesophageal reflux disease); Hyperlipidemia; Dementia; SVT (supraventricular tachycardia); Vitamin D deficiency; Carotid stenosis; Osteopenia; Syncope; Anxiety; Cervical disc  disease; Pyelonephritis; LBBB (left bundle branch block); Ventricular tachycardia; CAD (coronary artery disease); PVC (premature ventricular contraction); Headache(784.0); Cervical spondylosis; Heartburn; Hydronephrosis; Hypoglycemia; Rectocele; Microscopic hematuria; and Heart murmur.    has past surgical history that includes Total abdominal hysterectomy w/ bilateral salpingoophorectomy; Hernia repair; Cystectomy; Ureterostomy; appendectom; Cardiac catheterization; Fracture surgery; and Abdominal hysterectomy.  Current outpatient prescriptions:amLODipine (NORVASC) 5 MG tablet, Take 5 mg by mouth daily., Disp: , Rfl: ;  aspirin 81 MG tablet, Take 81 mg by mouth daily.  , Disp: , Rfl: ;  atenolol (TENORMIN) 25 MG tablet, Take 25 mg by mouth 2 (two) times daily., Disp: , Rfl: ;  Calcium Carbonate (CALTRATE 600 PO), Take 1 tablet by mouth daily.  , Disp: , Rfl: ;  chlorpheniramine (CHLOR-TRIMETON) 4 MG tablet, Take 4 mg by mouth at bedtime., Disp: , Rfl:  Docusate Calcium (STOOL SOFTENER PO), Take 1 capsule by mouth daily.  , Disp: , Rfl: ;  donepezil (ARICEPT) 10 MG tablet, Take 10 mg by mouth daily.  , Disp: , Rfl: ;  esomeprazole (NEXIUM) 40 MG capsule, Take 40 mg by mouth daily before breakfast., Disp: , Rfl: ;  HYDROcodone-homatropine (HYCODAN) 5-1.5 MG/5ML syrup, Take 5 mLs by mouth every 6 (six) hours as needed for cough., Disp: 180 mL, Rfl: 0 memantine (NAMENDA) 10 MG tablet, Take 10 mg by mouth 2 (two) times daily.  , Disp: , Rfl: ;  Multiple Vitamin (MULTIVITAMIN) capsule, Take 1 capsule by mouth daily.  , Disp: , Rfl: ;  nortriptyline (PAMELOR) 25 MG capsule, Take 25 mg by  mouth at bedtime. , Disp: , Rfl: ;  pregabalin (LYRICA) 50 MG capsule, Take 50 mg by mouth daily., Disp: , Rfl: ;  PREMARIN vaginal cream, Place 1 g vaginally daily. , Disp: , Rfl:  rosuvastatin (CRESTOR) 10 MG tablet, Take 10 mg by mouth every Monday, Wednesday, and Friday. , Disp: , Rfl: ;  zolpidem (AMBIEN CR) 12.5 MG CR  tablet, Take 12.5 mg by mouth at bedtime.  , Disp: , Rfl:    Review of Systems  Constitutional: Negative for fever and unexpected weight change.  HENT: Negative for congestion, dental problem, ear pain, nosebleeds, postnasal drip, rhinorrhea, sinus pressure, sneezing, sore throat and trouble swallowing.   Eyes: Negative for redness and itching.  Respiratory: Positive for cough. Negative for chest tightness, shortness of breath and wheezing.   Cardiovascular: Negative for palpitations and leg swelling.  Gastrointestinal: Negative for nausea and vomiting.  Genitourinary: Negative for dysuria.  Musculoskeletal: Negative for joint swelling.  Skin: Negative for rash.  Neurological: Negative for headaches.  Hematological: Does not bruise/bleed easily.  Psychiatric/Behavioral: Negative for dysphoric mood. The patient is not nervous/anxious.        Objective:   Physical Exam  Vitals reviewed. Constitutional: She is oriented to person, place, and time. She appears well-developed and well-nourished. No distress.  HENT:  Head: Normocephalic and atraumatic.  Right Ear: External ear normal.  Left Ear: External ear normal.  Mouth/Throat: Oropharynx is clear and moist. No oropharyngeal exudate.  Eyes: Conjunctivae and EOM are normal. Pupils are equal, round, and reactive to light. Right eye exhibits no discharge. Left eye exhibits no discharge. No scleral icterus.  Neck: Normal range of motion. Neck supple. No JVD present. No tracheal deviation present. No thyromegaly present.  Cardiovascular: Normal rate, regular rhythm, normal heart sounds and intact distal pulses.  Exam reveals no gallop and no friction rub.   No murmur heard. Pulmonary/Chest: Effort normal and breath sounds normal. No respiratory distress. She has no wheezes. She has no rales. She exhibits no tenderness.  Abdominal: Soft. Bowel sounds are normal. She exhibits no distension and no mass. There is no tenderness. There is no  rebound and no guarding.  Musculoskeletal: Normal range of motion. She exhibits no edema and no tenderness.  Lymphadenopathy:    She has no cervical adenopathy.  Neurological: She is alert and oriented to person, place, and time. She has normal reflexes. No cranial nerve deficit. She exhibits normal muscle tone. Coordination normal.  Skin: Skin is warm and dry. No rash noted. She is not diaphoretic. No erythema. No pallor.  Psychiatric:  dementia          Assessment & Plan:

## 2013-10-14 ENCOUNTER — Encounter (INDEPENDENT_AMBULATORY_CARE_PROVIDER_SITE_OTHER): Payer: Self-pay

## 2013-10-14 DIAGNOSIS — Z0289 Encounter for other administrative examinations: Secondary | ICD-10-CM

## 2013-10-16 NOTE — Assessment & Plan Note (Signed)
Unclear why cough is worse Continue all regular medications Take prednisone 40 mg daily x 2 days, then 20mg daily x 2 days, then 10mg daily x 2 days, then 5mg daily x 2 days and stop IF develops fever, sore throat getting worse, colored phlegm - call us for antibiotics Follow with Dr Clance next month 

## 2013-11-02 ENCOUNTER — Encounter: Payer: Self-pay | Admitting: Family

## 2013-11-03 ENCOUNTER — Encounter: Payer: Self-pay | Admitting: Family

## 2013-11-03 ENCOUNTER — Ambulatory Visit (INDEPENDENT_AMBULATORY_CARE_PROVIDER_SITE_OTHER): Payer: Medicare Other | Admitting: Family

## 2013-11-03 ENCOUNTER — Ambulatory Visit (HOSPITAL_COMMUNITY)
Admission: RE | Admit: 2013-11-03 | Discharge: 2013-11-03 | Disposition: A | Discharge status: Home / Self Care | Payer: Medicare Other | Source: Ambulatory Visit | Attending: Family | Admitting: Family

## 2013-11-03 DIAGNOSIS — I6529 Occlusion and stenosis of unspecified carotid artery: Secondary | ICD-10-CM | POA: Insufficient documentation

## 2013-11-03 DIAGNOSIS — I658 Occlusion and stenosis of other precerebral arteries: Secondary | ICD-10-CM | POA: Insufficient documentation

## 2013-11-03 NOTE — Patient Instructions (Signed)
Stroke Prevention Some medical conditions and behaviors are associated with an increased chance of having a stroke. You may prevent a stroke by making healthy choices and managing medical conditions. Reduce your risk of having a stroke by:  Staying physically active. Get at least 30 minutes of activity on most or all days.  Not smoking. It may also be helpful to avoid exposure to secondhand smoke.  Limiting alcohol use. Moderate alcohol use is considered to be:  No more than 2 drinks per day for men.  No more than 1 drink per day for nonpregnant women.  Eating healthy foods.  Include 5 or more servings of fruits and vegetables a day.  Certain diets may be prescribed to address high blood pressure, high cholesterol, diabetes, or obesity.  Managing your cholesterol levels.  A low-saturated fat, low-trans fat, low-cholesterol, and high-fiber diet may control cholesterol levels.  Take any prescribed medicines to control cholesterol as directed by your caregiver.  Managing your diabetes.  A controlled-carbohydrate, controlled-sugar diet is recommended to manage diabetes.  Take any prescribed medicines to control diabetes as directed by your caregiver.  Controlling your high blood pressure (hypertension).  A low-salt (sodium), low-saturated fat, low-trans fat, and low-cholesterol diet is recommended to manage high blood pressure.  Take any prescribed medicines to control hypertension as directed by your caregiver.  Maintaining a healthy weight.  A reduced-calorie, low-sodium, low-saturated fat, low-trans fat, low-cholesterol diet is recommended to manage weight.  Stopping drug abuse.  Avoiding birth control pills.  Talk to your caregiver about the risks of taking birth control pills if you are over 35 years old, smoke, get migraines, or have ever had a blood clot.  Getting evaluated for sleep disorders (sleep apnea).  Talk to your caregiver about getting a sleep evaluation  if you snore a lot or have excessive sleepiness.  Taking medicines as directed by your caregiver.  For some people, aspirin or blood thinners (anticoagulants) are helpful in reducing the risk of forming abnormal blood clots that can lead to stroke. If you have the irregular heart rhythm of atrial fibrillation, you should be on a blood thinner unless there is a good reason you cannot take them.  Understand all your medicine instructions. SEEK IMMEDIATE MEDICAL CARE IF:   You have sudden weakness or numbness of the face, arm, or leg, especially on one side of the body.  You have sudden confusion.  You have trouble speaking (aphasia) or understanding.  You have sudden trouble seeing in one or both eyes.  You have sudden trouble walking.  You have dizziness.  You have a loss of balance or coordination.  You have a sudden, severe headache with no known cause.  You have new chest pain or an irregular heartbeat. Any of these symptoms may represent a serious problem that is an emergency. Do not wait to see if the symptoms will go away. Get medical help right away. Call your local emergency services (911 in [place] Do not drive yourself to the hospital. Document Released: 01/17/2005 Document Revised: 03/03/2012 Document Reviewed: 06/12/2013 [employer] Patient Information 2014 [employer].  

## 2013-11-03 NOTE — Progress Notes (Signed)
Established Carotid Patient  History of Present Illness  [name] is a 79 y.o. female patient whom Dr. Arbie Cookey has been following for carotid artery surveillance, she returns today for same.  Patient has not had previous vascular intervention.. Neither patient nor husband recalls why she was referred here, but review of records indicates that Dr. Clelia Croft referred patient in 2010 for evaluation and continued monitoring of 20-39% right ICA stenosis and 40-59% left ICA stenosis.  Patient has Negative history of TIA or stroke symptom.  The patient denies amaurosis fugax or monocular blindness.  The patient  denies facial drooping.  Pt. denies hemiplegia.  The patient denies receptive or expressive aphasia.  Pt. denies extremity weakness.  Patient denies claudication symptoms, denies non-healing wounds.   Patient denies New Medical or Surgical History.  Pt Diabetic: No Pt smoker: former smoker, quit 60 years ago  Pt meds include: Statin : Yes ASA: Yes Other anticoagulants/antiplatelets: no   Past Medical History  Diagnosis Date  . Bladder cancer     s/p cystectomy/ureterostomy  . Hypertension   . S/P appendectomy   . GERD (gastroesophageal reflux disease)   . Hyperlipidemia   . Dementia   . SVT (supraventricular tachycardia)   . Vitamin D deficiency   . Carotid stenosis   . Osteopenia   . Syncope   . Anxiety   . Cervical disc disease   . Pyelonephritis   . LBBB (left bundle branch block)   . Ventricular tachycardia   . CAD (coronary artery disease)   . PVC (premature ventricular contraction)   . Headache(784.0)   . Cervical spondylosis   . Heartburn   . Hydronephrosis   . Hypoglycemia   . Rectocele   . Microscopic hematuria   . Heart murmur   . Jejunostomy tube fell out   . Fall at home Nov. 9, 2014    Pt was picking up her dog and fell    Social History History  Substance Use Topics  . Smoking status: Former Smoker -- 0.10 packs/day for 4 years    Types:  Cigarettes    Quit date: 12/24/1949  . Smokeless tobacco: Never Used  . Alcohol Use: No    Family History Family History  Problem Relation Age of Onset  . Lung cancer Father   . Cancer Father   . Heart attack Father   . Heart disease Mother     Heart Disease before age 40  . Allergies Mother   . Hypertension Mother   . Heart attack Mother   . Heart disease Sister   . Allergies Sister     Surgical History Past Surgical History  Procedure Laterality Date  . Total abdominal hysterectomy w/ bilateral salpingoophorectomy    . Hernia repair    . Cystectomy    . Ureterostomy    . Appendectom    . Cardiac catheterization    . Fracture surgery    . Abdominal hysterectomy      Allergies  Allergen Reactions  . Bethanechol Chloride     Chest pains. Coronary spasms  . Celecoxib     Celebrex.  Severe itching.   . Citalopram Hydrobromide     Hyper, nervousness  . Clindamycin     Itching, coughing  . Codeine     tachycardia  . Desipramine     Severe tachycardia  . Epinephrine     Severe shortness of breath.   . Erythromycin     Burning, itching  . Floxin [Ocuflox]  itching  . Glucose     Sugar.  Weakness,faintness  . Hydromorphone Hcl     Dilaudid.  Comatose-Anaphylactic reaction.   . Ibuprofen     Tachycardia. weakness  . Ibuprofen     Tachycardia, weakness  . Iodine     Hives, edema, tachycardia  . Meperidine Hcl     Demerol.  Tachycardia, extreme nervousness.   . Morphine     Tachycardia, hallucinations, extreme nervousness.  . Paroxetine Hcl     Extreme nervousness  . Penicillins     Severe edema, severe difficulty breathing, anaphylactic reaction.  . Procaine Hcl     Novocain. Severe shortness of breath.  . Propranolol Hcl     Inderal.  Extreme low blood pressure.   . Red Dye     Severe itching  . Restoril     Temazepam.  Extreme nervousness, sleeplessness  . Scopolamine     Irrational, loss of memory  . Sulfonamide Derivatives     Hives,  edema  . Tetracycline     Severe hives, edema  . Trazodone And Nefazodone     Very hyper  . Valium     itching    Current Outpatient Prescriptions  Medication Sig Dispense Refill  . amLODipine (NORVASC) 5 MG tablet Take 5 mg by mouth daily.      Marland Kitchen aspirin 81 MG tablet Take 81 mg by mouth daily.        Marland Kitchen atenolol (TENORMIN) 25 MG tablet Take 25 mg by mouth 2 (two) times daily.      . Calcium Carbonate (CALTRATE 600 PO) Take 1 tablet by mouth daily.        . chlorpheniramine (CHLOR-TRIMETON) 4 MG tablet Take 4 mg by mouth at bedtime.      Tery Sanfilippo Calcium (STOOL SOFTENER PO) Take 1 capsule by mouth daily.        Marland Kitchen donepezil (ARICEPT) 10 MG tablet Take 10 mg by mouth daily.        Marland Kitchen esomeprazole (NEXIUM) 40 MG capsule Take 40 mg by mouth daily before breakfast.      . HYDROcodone-homatropine (HYCODAN) 5-1.5 MG/5ML syrup Take 5 mLs by mouth every 6 (six) hours as needed for cough.  180 mL  0  . memantine (NAMENDA) 10 MG tablet Take 10 mg by mouth 2 (two) times daily.        . Multiple Vitamin (MULTIVITAMIN) capsule Take 1 capsule by mouth daily.        . nortriptyline (PAMELOR) 25 MG capsule Take 25 mg by mouth at bedtime.       . predniSONE (DELTASONE) 10 MG tablet 4 tablets daily X2 days, 2 tablets daily X2days, 1 tablet daily X2days, 0.5tablet daily [place], then stop.  15 tablet  0  . pregabalin (LYRICA) 50 MG capsule Take 50 mg by mouth daily.      Marland Kitchen PREMARIN vaginal cream Place 1 g vaginally daily.       . rosuvastatin (CRESTOR) 10 MG tablet Take 10 mg by mouth every Monday, Wednesday, and Friday.       . zolpidem (AMBIEN CR) 12.5 MG CR tablet Take 12.5 mg by mouth at bedtime.         No current facility-administered medications for this visit.    Review of Systems : [x]  Positive   [ ]  Denies  General:[ ]  Weight loss,  [ ]  Weight gain, [ ]  Loss of appetite, [ ]  Fever, [ ]  chills  Neurologic: [ ]   Dizziness, [ ]  Blackouts, [ ]  Headaches, [ ]  Seizure [ ]  Stroke, [ ]  "Mini  stroke", [ ]  Slurred speech, [ ]  Temporary blindness;  [ ] weakness,  Ear/Nose/Throat: [ ]  Change in hearing, [ ]  Nose bleeds, [ ]  Hoarseness  Vascular:[ ]  Pain in legs with walking, [ ]  Pain in feet while lying flat , [ ]   Non-healing ulcer, [ ]  Blood clot in vein,    Pulmonary: [ ]  Home oxygen, [ ]   Productive cough, [ ]  Bronchitis, [ ]  Coughing up blood,  [ ]  Asthma, [ ]  Wheezing  Musculoskeletal:  [ ]  Arthritis, [ ]  Joint pain, [ ]  low back pain  Cardiac: [ ]  Chest pain, [ ]  Shortness of breath when lying flat, [ ]  Shortness of breath with exertion, [ ]  Palpitations, [ ]  Heart murmur, [ ]   Atrial fibrillation  Hematologic:[ ]  Easy Bruising, [ ]  Anemia; [ ]  Hepatitis  Psychiatric: [ ]   Depression, [ ]  Anxiety   Gastrointestinal: [ ]  Black stool, [ ]  Blood in stool, [ ]  Peptic ulcer disease,  [ ]  Gastroesophageal Reflux, [ ]  Trouble swallowing, [ ]  Diarrhea, [ ]  Constipation  Urinary: [ ]  chronic Kidney disease, [ ]  on HD, [ ]  Burning with urination, [ ]  Frequent urination, [ ]  Difficulty urinating;   Skin: [ ]  Rashes, [ ]  Wounds    Physical Examination  Filed Vitals:   11/03/13 1611  BP: 113/59  Pulse: 71  Resp:    Filed Weights   11/03/13 1606  Weight: 151 lb (68.493 kg)   Body mass index is 24.38 kg/(m^2).  General: WDWN female in NAD GAIT: normal Eyes: PERRLA Pulmonary:  CTAB, Negative  Rales, Negative rhonchi, & Negative wheezing.  Cardiac: regular Rhythm ,  Positive Murmur.  VASCULAR EXAM Carotid Bruits Left Right   Transmitted cardiac murmur Negative    Aorta is not palpable. Radial pulses are 2+ palpable and equal.                                                                                                                            LE Pulses LEFT RIGHT       POPLITEAL  not palpable   not palpable       POSTERIOR TIBIAL  not palpable   not palpable        DORSALIS PEDIS      ANTERIOR TIBIAL  palpable   palpable     Gastrointestinal: soft,  nontender, BS WNL, no r/g,  negative masses.  Musculoskeletal: Negative muscle atrophy/wasting. M/S 5/5 throughout, Extremities without ischemic changes.  Neurologic: A&O X 3; Appropriate Affect ; SENSATION ;normal;  Speech is normal CN 2-12 intact, Pain and light touch intact in extremities, Motor exam as listed above.   Non-Invasive Vascular Imaging CAROTID DUPLEX 11/03/2013   Right ICA: <40% stenosis. Left ICA: <40% stenosis.  These findings are Unchanged since 2012.  Assessment: NOLYN EILERT is a 79 y.o. female who  presents with asymptomatic minimal bilateral ICA stenosis. This has improved without intervention from 2010 at which time carotid Duplex demonstrated 20-39% right ICA stenosis and 40-59% left ICA stenosis. Cardiac murmur present which is transmitted to left carotid artery.  Plan: Based on today's exam and carotid Duplex results, patient advised to follow-up in 1 year with Carotid Duplex scan.  I discussed in depth with the patient the nature of atherosclerosis, and emphasized the importance of maximal medical management including strict control of blood pressure, blood glucose, and lipid levels, obtaining regular exercise, and continued cessation of smoking.  The patient is aware that without maximal medical management the underlying atherosclerotic disease process will progress, limiting the benefit of any interventions. The patient was given information about stroke prevention and what symptoms should prompt the patient to seek immediate medical care. Thank you for allowing [place] to participate in this patient's care.  Charisse March, RN, MSN, FNP-C [facility] Office: [phone]  Clinic Physician: Early  11/03/2013 4:33 PM

## 2013-11-04 NOTE — Addendum Note (Signed)
Addended by: Sharee Pimple on: 11/04/2013 08:21 AM   Modules accepted: Orders

## 2013-11-06 ENCOUNTER — Encounter (INDEPENDENT_AMBULATORY_CARE_PROVIDER_SITE_OTHER): Payer: Self-pay

## 2013-11-06 DIAGNOSIS — Z0289 Encounter for other administrative examinations: Secondary | ICD-10-CM

## 2013-11-10 ENCOUNTER — Encounter: Payer: Self-pay | Admitting: Pulmonary Disease

## 2013-11-10 ENCOUNTER — Ambulatory Visit (INDEPENDENT_AMBULATORY_CARE_PROVIDER_SITE_OTHER): Payer: Medicare Other | Admitting: Pulmonary Disease

## 2013-11-10 VITALS — BP 120/52 | HR 73 | Temp 97.8°F | Ht 66.0 in | Wt 152.2 lb

## 2013-11-10 DIAGNOSIS — R05 Cough: Secondary | ICD-10-CM

## 2013-11-10 MED ORDER — MONTELUKAST SODIUM 10 MG PO TABS: 10.0000 mg | ORAL_TABLET | Freq: Every day | ORAL | Status: DC

## 2013-11-10 NOTE — Patient Instructions (Signed)
Stay on chlortabs at bedtime, zyrtec in am.  Stay on reflux medication.  Add singulair 10mg  one each am for next one month to see if helps cough/postnasal drip Try to avoid throat clearing as much as possible.  followup with me as needed, but let me know how the new medication does.

## 2013-11-10 NOTE — Addendum Note (Signed)
Addended by: Maisie Fus on: 11/10/2013 03:37 PM   Modules accepted: Orders

## 2013-11-10 NOTE — Assessment & Plan Note (Signed)
The patient's cough is much improved after a course of prednisone.  This is clearly coming from her upper airway, and her lung fields are again totally clear today.  It typically starts with postnasal drip or reflux and then her throat clearing and cyclical coughing escalate it to a higher level.  I will try her on Singulair to see if this will help with the allergic response and decrease her postnasal drip.  I have also reviewed with her the behavioral therapy for cyclical coughing again.

## 2013-11-10 NOTE — Progress Notes (Signed)
  Subjective:    Patient ID: [name], female    DOB: [birth year], 79 y.o.   MRN: [id number]  HPI The patient comes in today for followup of her recurrent chronic cough.  This is felt secondary to postnasal drip, LPR, and significant cyclical coughing.  It always worsens around the time of spring and fall allergy seasons, and frequently response to a course of prednisone.  She is much improved since last visit after a round of prednisone, but continues to clear her throat during our visit today.  She has not had any shortness of breath or mucus with the cough.  She is on an aggressive regimen with antihistamines and proton pump inhibitors.   Review of Systems  Constitutional: Negative for fever and unexpected weight change.  HENT: Negative for congestion, dental problem, ear pain, nosebleeds, postnasal drip, rhinorrhea, sinus pressure, sneezing, sore throat and trouble swallowing.   Eyes: Negative for redness and itching.  Respiratory: Positive for cough. Negative for chest tightness, shortness of breath and wheezing.   Cardiovascular: Negative for palpitations and leg swelling.  Gastrointestinal: Negative for nausea and vomiting.  Genitourinary: Negative for dysuria.  Musculoskeletal: Negative for joint swelling.  Skin: Negative for rash.  Neurological: Negative for headaches.  Hematological: Does not bruise/bleed easily.  Psychiatric/Behavioral: Negative for dysphoric mood. The patient is not nervous/anxious.        Objective:   Physical Exam Frail-appearing female in no acute distress Nose without purulence or discharge noted Neck without lymphadenopathy or thyromegaly Lower extremities without edema, no cyanosis Alert and oriented, moves all 4 extremities.       Assessment & Plan:

## 2013-12-08 ENCOUNTER — Encounter (INDEPENDENT_AMBULATORY_CARE_PROVIDER_SITE_OTHER): Payer: Self-pay

## 2013-12-08 DIAGNOSIS — Z0289 Encounter for other administrative examinations: Secondary | ICD-10-CM

## 2013-12-14 ENCOUNTER — Encounter: Payer: Self-pay | Admitting: Pulmonary Disease

## 2013-12-14 ENCOUNTER — Telehealth: Payer: Self-pay | Admitting: Pulmonary Disease

## 2013-12-14 NOTE — Telephone Encounter (Signed)
LMTCB

## 2013-12-14 NOTE — Telephone Encounter (Signed)
Did the singulair help?  Is she still taking? Ok for hycodan 5 cc q6h prn, 6 ounces, no fills.

## 2013-12-14 NOTE — Telephone Encounter (Signed)
atc busy signal 

## 2013-12-14 NOTE — Telephone Encounter (Signed)
Spoke with pt's husband. Reports dry cough x2 weeks. Denies chest congestion, sinus congestion or runny nose. Wants a refill on Hycodan.  KC - please advise. Thanks.

## 2013-12-15 MED ORDER — HYDROCODONE-HOMATROPINE 5-1.5 MG/5ML PO SYRP: 5.0000 mL | ORAL_SOLUTION | Freq: Four times a day (QID) | ORAL | Status: DC | PRN

## 2013-12-15 MED ORDER — MOMETASONE FUROATE 220 MCG/INH IN AEPB: 2.0000 | INHALATION_SPRAY | Freq: Every day | RESPIRATORY_TRACT | Status: DC

## 2013-12-15 NOTE — Telephone Encounter (Signed)
I called and spoke spouse. He reports pt has been out of singulair so she has not been taking this. He reports he will pick up the cough syrup RX. He also wants to know if [facility] will call in prednisone for pt. He reports this really helps the pt. Please advise KC thanks

## 2013-12-15 NOTE — Telephone Encounter (Signed)
Spoke with the pt's spouse  He states pt did not feel like she had response at all to singulair  She did take med for at least 3 wks  I advised of KC's recs and he verbalized understanding Samples up front and will show caretaker how to use when she comes in to pick up

## 2013-12-15 NOTE — Telephone Encounter (Signed)
Did she have a response to singulair?  Did she take at least 3-4 weeks to give fair chance.  Would like to avoid frequent prednisone use if possible.  Let's try an inhaled form of prednisone that stays in lungs and does not cause complications.  This may or may not help. asmanex 2 inhalations at bedtime.  Rinse mouth well. He can come by for samples, and to be shown how to use it.

## 2014-01-01 ENCOUNTER — Telehealth: Payer: Self-pay | Admitting: Pulmonary Disease

## 2014-01-01 MED ORDER — MOMETASONE FUROATE 220 MCG/INH IN AEPB: 2.0000 | INHALATION_SPRAY | Freq: Every day | RESPIRATORY_TRACT | Status: DC

## 2014-01-01 NOTE — Telephone Encounter (Signed)
Spoke with pt spouse. He reports the asamanex has helped with pt cough and breathing. Asking for rx to be sent into the pharmacy. I advised will do so. Nothing further needed

## 2014-01-05 ENCOUNTER — Encounter (INDEPENDENT_AMBULATORY_CARE_PROVIDER_SITE_OTHER): Payer: Self-pay

## 2014-01-05 ENCOUNTER — Telehealth: Payer: Self-pay | Admitting: Pulmonary Disease

## 2014-01-05 DIAGNOSIS — Z0289 Encounter for other administrative examinations: Secondary | ICD-10-CM

## 2014-01-05 NOTE — Telephone Encounter (Signed)
Spoke with Maryann Conners, after reading through message and recs per [facility] from last OV 10/2013, pt has tried all recs and is no better.  Patient scheduled for appt with [place] to eval increased cough 01/07/14 at 10am

## 2014-01-05 NOTE — Telephone Encounter (Signed)
Called and spoke with [name] (pt spouse). He reports pt is taking the asmanex daily. Pt is now coughing all day and all night. It is a non productive cough. Per spouse pt does not have any PND. He reports the thing that helps pt more than anything is prednisone. She did not have a response to the Singulair-so not taking now. Pt spouse then put pt care taker on the phone [facility].  She is also taking the hycodan 1-2 times daily usually. She takes OTC chlortrimeton 2 at bedtime. Spouse is requesting recs. Please advise KC thanks  Allergies  Allergen Reactions  . Bethanechol Chloride     Chest pains. Coronary spasms  . Celecoxib     Celebrex.  Severe itching.   . Citalopram Hydrobromide     Hyper, nervousness  . Clindamycin     Itching, coughing  . Codeine     tachycardia  . Desipramine     Severe tachycardia  . Epinephrine     Severe shortness of breath.   . Erythromycin     Burning, itching  . Floxin [Ocuflox]     itching  . Glucose     Sugar.  Weakness,faintness  . Hydromorphone Hcl     Dilaudid.  Comatose-Anaphylactic reaction.   . Ibuprofen     Tachycardia. weakness  . Ibuprofen     Tachycardia, weakness  . Iodine     Hives, edema, tachycardia  . Meperidine Hcl     Demerol.  Tachycardia, extreme nervousness.   . Morphine     Tachycardia, hallucinations, extreme nervousness.  . Paroxetine Hcl     Extreme nervousness  . Penicillins     Severe edema, severe difficulty breathing, anaphylactic reaction.  . Procaine Hcl     Novocain. Severe shortness of breath.  . Propranolol Hcl     Inderal.  Extreme low blood pressure.   . Red Dye     Severe itching  . Restoril     Temazepam.  Extreme nervousness, sleeplessness  . Scopolamine     Irrational, loss of memory  . Sulfonamide Derivatives     Hives, edema  . Tetracycline     Severe hives, edema  . Trazodone And Nefazodone     Very hyper  . Valium     itching

## 2014-01-07 ENCOUNTER — Encounter: Payer: Self-pay | Admitting: Pulmonary Disease

## 2014-01-07 ENCOUNTER — Ambulatory Visit (INDEPENDENT_AMBULATORY_CARE_PROVIDER_SITE_OTHER): Payer: Medicare Other | Admitting: Pulmonary Disease

## 2014-01-07 VITALS — BP 114/68 | HR 72 | Temp 97.8°F | Ht 66.0 in | Wt 153.4 lb

## 2014-01-07 DIAGNOSIS — R053 Chronic cough: Secondary | ICD-10-CM

## 2014-01-07 DIAGNOSIS — R059 Cough, unspecified: Secondary | ICD-10-CM

## 2014-01-07 DIAGNOSIS — R05 Cough: Secondary | ICD-10-CM

## 2014-01-07 MED ORDER — PREDNISONE 10 MG PO TABS: ORAL_TABLET | ORAL | Status: DC

## 2014-01-07 NOTE — Assessment & Plan Note (Addendum)
cxr 11/2011:  Clear PFT's 11/2011:  No obstruction, no restriction, minimal decrease in DLCO ++response to rx of PND , LPR, cyclical coughing. Frequently occurs spring and fall allergy season >> +response to prednisone, no response to oral antihistamines/nasal steroids.  Trial of singulair 10/2013 >> no response Trial of asmanex 10/2013:  No better.    With the patient has a chronic cough that has been intermittent in the past, but now is more consistent. The patient and her husband claimed that a short course of prednisone completely resolves the cough, but we have never found airflow obstruction on spirometry. We have also given her a course of Singulair and Asmanex to treat cough variant asthma, and she saw absolutely no improvement in the cough. The prednisone can also treat postnasal drip/rhinitis, but she has been on aggressive treatment for this when she is not on prednisone. Finally, prednisone can also decrease upper airway irritation associated with reflux and ongoing cough. She clearly has a component of the irritable larynx syndrome, but I am very concerned about her advancing dementia and whether microaspiration may be contributing to this. I think it is worthwhile to have speech evaluate her swallowing. If this is unremarkable, I would have otolaryngology do an upper airway exam looking for structural abnormalities or other causes of upper airway cough. I really have not found a pulmonary explanation for her cough at this time.

## 2014-01-07 NOTE — Progress Notes (Signed)
   Subjective:    Patient ID: [name], female    DOB: March 03, 1926, 80 y.o.   MRN: [id number]  HPI The patient comes in today for an acute visit related to chronic cough. This has been an ongoing issue for her for a few years, but it has been significantly worse this past year. Part of this is related to the irritable larynx syndrome, and she is also had an element of postnasal drip and possibly laryngopharyngeal reflux. More recently, she has had an acceleration of her cough that has been refractory to traditional therapy. She and her husband feel that a course of prednisone totally eliminates the cough, but most recently she has not responded to aggressive treatment of her cough variant asthma. She has been treated aggressively for rhinitis and also reflux without change. I am somewhat concerned because of her advancing dementia, and the husband states that she does cough with eating and drinking at times. She has not had an upper airway evaluation by otolaryngology.   Review of Systems  Constitutional: Negative for fever and unexpected weight change.  HENT: Negative for congestion, dental problem, ear pain, nosebleeds, postnasal drip, rhinorrhea, sinus pressure, sneezing, sore throat and trouble swallowing.   Eyes: Negative for redness and itching.  Respiratory: Positive for cough. Negative for chest tightness, shortness of breath and wheezing.   Cardiovascular: Negative for palpitations and leg swelling.  Gastrointestinal: Negative for nausea and vomiting.  Genitourinary: Negative for dysuria.  Musculoskeletal: Negative for joint swelling.  Skin: Negative for rash.  Neurological: Negative for headaches.  Hematological: Does not bruise/bleed easily.  Psychiatric/Behavioral: Negative for dysphoric mood. The patient is not nervous/anxious.        Objective:   Physical Exam Thin female in no acute distress Nose without purulence or discharge noted Oropharynx clear Neck without  lymphadenopathy or thyromegaly Chest totally clear to auscultation, no wheezing Cardiac exam with regular rate and rhythm, 1/6 systolic murmur Lower extremities without edema, no cyanosis Alert and oriented, moves all 4 extremities.       Assessment & Plan:

## 2014-01-07 NOTE — Patient Instructions (Addendum)
Stop asmanex and singulair if still taking. Can stop zyrtec during the day, but if post nasal drip worsens, need to restart. Stay on your acid reflux medication Will give you a short course of prednisone to get you thru this episode, while waiting on further evaluation. Will arrange for speech evaluation to consider microaspiration as the cause of your cough. If the speech evaluation is normal, will have ENT examine your upper airway.

## 2014-01-11 ENCOUNTER — Other Ambulatory Visit (HOSPITAL_COMMUNITY): Payer: Self-pay | Admitting: Pulmonary Disease

## 2014-01-11 DIAGNOSIS — R1310 Dysphagia, unspecified: Secondary | ICD-10-CM

## 2014-01-12 ENCOUNTER — Ambulatory Visit (HOSPITAL_COMMUNITY)
Admission: RE | Admit: 2014-01-12 | Discharge: 2014-01-12 | Disposition: A | Discharge status: Home / Self Care | Payer: Medicare Other | Source: Ambulatory Visit | Attending: Pulmonary Disease | Admitting: Pulmonary Disease

## 2014-01-12 DIAGNOSIS — R1310 Dysphagia, unspecified: Secondary | ICD-10-CM

## 2014-01-12 DIAGNOSIS — R053 Chronic cough: Secondary | ICD-10-CM

## 2014-01-12 DIAGNOSIS — R059 Cough, unspecified: Secondary | ICD-10-CM | POA: Insufficient documentation

## 2014-01-12 DIAGNOSIS — R05 Cough: Secondary | ICD-10-CM | POA: Insufficient documentation

## 2014-01-12 NOTE — Procedures (Signed)
Objective Swallowing Evaluation: Modified Barium Swallowing Study  Patient Details  Name: [name] MRN: [id number] Date of Birth: 1926/02/11  Today's Date: 01/12/2014 Time: 9735-3299 SLP Time Calculation (min): 24 min  Past Medical History:  Past Medical History  Diagnosis Date  . Bladder cancer     s/p cystectomy/ureterostomy  . Hypertension   . S/P appendectomy   . GERD (gastroesophageal reflux disease)   . Hyperlipidemia   . Dementia   . SVT (supraventricular tachycardia)   . Vitamin D deficiency   . Carotid stenosis   . Osteopenia   . Syncope   . Anxiety   . Cervical disc disease   . Pyelonephritis   . LBBB (left bundle branch block)   . Ventricular tachycardia   . CAD (coronary artery disease)   . PVC (premature ventricular contraction)   . Headache(784.0)   . Cervical spondylosis   . Heartburn   . Hydronephrosis   . Hypoglycemia   . Rectocele   . Microscopic hematuria   . Heart murmur   . Jejunostomy tube fell out   . Fall at home Nov. 9, 2014    Pt was picking up her dog and fell   Past Surgical History:  Past Surgical History  Procedure Laterality Date  . Total abdominal hysterectomy w/ bilateral salpingoophorectomy    . Hernia repair    . Cystectomy    . Ureterostomy    . Appendectom    . Cardiac catheterization    . Fracture surgery    . Abdominal hysterectomy     HPI:  80 yo female referred by Dr Gwenette Greet for MBS due to pt having chronic cough - including cough when eating/drinking. Pt PMH + for rhinitis, reflux, and advancing dementia.  Pt also has a past medical history of Bladder cancer; Hypertension; S/P appendectomy; GERD (gastroesophageal reflux disease); Hyperlipidemia; Dementia; SVT (supraventricular tachycardia); Vitamin D deficiency; Carotid stenosis; Osteopenia; Syncope; Anxiety; Cervical disc disease; Pyelonephritis; LBBB (left bundle branch block); Ventricular tachycardia; CAD (coronary artery disease); PVC (premature ventricular  contraction); Headache(784.0); Cervical spondylosis; Heartburn; Hydronephrosis; Hypoglycemia; Rectocele; Microscopic hematuria; and Heart murmur. per Dr Chase Caller office note.  Pt with chronic cough and has been treated for reflux and allergies without significant improvement from review of MD note.         Assessment / Plan / Recommendation Clinical Impression  Dysphagia Diagnosis: Within Functional Limits Clinical impression: Pt presents with functional swallow without aspiration or penetration of any consistency tested.  Pt noted to clear her throat before testing and cough once during MBS.  Swallow was timely with clear voice throughout.  Minimal pharyngeal residual noted with thin liqud on 2 occasions, clearing with dry swallow (reflexive swallow).  Pt swallowed a barium tablet with thin and it readily transited through esophagus- although she reported sensation of barium tablet lodging in pharynx.  Educated pt and spouse to precautions and strategies to ease comfort with intake.  Recommend regular/thin with dry swallow if pt senses stasis.     Treatment Recommendation  No treatment recommended at this time    Diet Recommendation Regular;Thin liquid   Liquid Administration via: Cup;Straw Medication Administration: Whole meds with liquid Supervision: Patient able to self feed Compensations:  (intermittent dry swallow if pt senses pharyngeal stasis) Postural Changes and/or Swallow Maneuvers: Seated upright 90 degrees;Upright 30-60 min after meal    Other  Recommendations Oral Care Recommendations: Oral care BID             SLP Swallow Goals  General Date of Onset: 01/12/14 HPI: 80 yo female referred by Dr Gwenette Greet for MBS due to pt having chronic cough - including cough when eating/drinking. Pt PMH + for rhinitis, reflux, and advancing dementia.  Pt also has a past medical history of Bladder cancer; Hypertension; S/P appendectomy; GERD (gastroesophageal reflux disease);  Hyperlipidemia; Dementia; SVT (supraventricular tachycardia); Vitamin D deficiency; Carotid stenosis; Osteopenia; Syncope; Anxiety; Cervical disc disease; Pyelonephritis; LBBB (left bundle branch block); Ventricular tachycardia; CAD (coronary artery disease); PVC (premature ventricular contraction); Headache(784.0); Cervical spondylosis; Heartburn; Hydronephrosis; Hypoglycemia; Rectocele; Microscopic hematuria; and Heart murmur. per Dr Chase Caller office note.  Pt with chronic cough and has been treated for reflux and allergies without significant improvement from review of MD note.     Type of Study: Modified Barium Swallowing Study Reason for Referral: Objectively evaluate swallowing function Diet Prior to this Study: Regular;Thin liquids Temperature Spikes Noted: No Respiratory Status: Room air History of Recent Intubation: No Behavior/Cognition: Alert;Cooperative;Pleasant mood Oral Cavity - Dentition: Adequate natural dentition Oral Motor / Sensory Function: Within functional limits Self-Feeding Abilities: Able to feed self Patient Positioning: Upright in chair Baseline Vocal Quality: Clear Volitional Cough: Strong Volitional Swallow: Able to elicit Anatomy: Within functional limits    Reason for Referral Objectively evaluate swallowing function   Oral Phase Oral Preparation/Oral Phase Oral Phase: WFL Oral - Nectar Oral - Nectar Cup: Within functional limits Oral - Thin Oral - Thin Cup: Within functional limits Oral - Thin Straw: Within functional limits Oral - Solids Oral - Puree: Within functional limits Oral - Regular: Within functional limits Oral - Pill: Within functional limits   Pharyngeal Phase Pharyngeal Phase Pharyngeal Phase: Within functional limits Pharyngeal - Nectar Pharyngeal - Nectar Cup: Within functional limits Pharyngeal - Thin Pharyngeal - Thin Cup: Within functional limits;Pharyngeal residue - valleculae Pharyngeal - Thin Straw: Within functional  limits Pharyngeal - Solids Pharyngeal - Puree: Within functional limits Pharyngeal - Regular: Within functional limits Pharyngeal - Pill: Within functional limits  Cervical Esophageal Phase    GO    Cervical Esophageal Phase Cervical Esophageal Phase: Impaired Cervical Esophageal Phase - Nectar Nectar Cup: Within functional limits Cervical Esophageal Phase - Thin Thin Cup: Within functional limits Thin Straw: Within functional limits Cervical Esophageal Phase - Solids Puree: Within functional limits Regular: Within functional limits Pill: Within functional limits Cervical Esophageal Phase - Comment Cervical Esophageal Comment: Appearance of minimal delay clearance of pudding at midesophagus -without pt awareness.  Liquid swallow facilitated clearance but with appearance of minimal backflow.  Barium tablet readily transited through esophagus.  Radiologist not present to confirm.      Functional Assessment Tool Used: mbs, clinical judgement Functional Limitations: Swallowing Swallow Current Status ([id number]): At least 1 percent but less than 20 percent impaired, limited or restricted Swallow Goal Status [phone]): At least 1 percent but less than 20 percent impaired, limited or restricted Swallow Discharge Status [id number]): At least 1 percent but less than 20 percent impaired, limited or restricted    Luanna Salk, [facility] SLP [phone]

## 2014-01-19 ENCOUNTER — Other Ambulatory Visit: Payer: Self-pay | Admitting: Pulmonary Disease

## 2014-01-19 DIAGNOSIS — R05 Cough: Secondary | ICD-10-CM

## 2014-01-19 DIAGNOSIS — R053 Chronic cough: Secondary | ICD-10-CM

## 2014-01-25 ENCOUNTER — Observation Stay (HOSPITAL_COMMUNITY)
Admission: EM | Admit: 2014-01-25 | Discharge: 2014-01-27 | Disposition: A | Discharge status: Home / Self Care | Payer: Medicare Other | Attending: Internal Medicine | Admitting: Internal Medicine

## 2014-01-25 ENCOUNTER — Emergency Department (HOSPITAL_COMMUNITY): Payer: Medicare Other

## 2014-01-25 ENCOUNTER — Encounter (HOSPITAL_COMMUNITY): Payer: Self-pay | Admitting: Emergency Medicine

## 2014-01-25 DIAGNOSIS — I447 Left bundle-branch block, unspecified: Secondary | ICD-10-CM | POA: Insufficient documentation

## 2014-01-25 DIAGNOSIS — Z87891 Personal history of nicotine dependence: Secondary | ICD-10-CM | POA: Insufficient documentation

## 2014-01-25 DIAGNOSIS — N183 Chronic kidney disease, stage 3 unspecified: Secondary | ICD-10-CM | POA: Insufficient documentation

## 2014-01-25 DIAGNOSIS — Z936 Other artificial openings of urinary tract status: Secondary | ICD-10-CM | POA: Insufficient documentation

## 2014-01-25 DIAGNOSIS — K219 Gastro-esophageal reflux disease without esophagitis: Secondary | ICD-10-CM | POA: Insufficient documentation

## 2014-01-25 DIAGNOSIS — Z8551 Personal history of malignant neoplasm of bladder: Secondary | ICD-10-CM | POA: Insufficient documentation

## 2014-01-25 DIAGNOSIS — I10 Essential (primary) hypertension: Secondary | ICD-10-CM | POA: Diagnosis present

## 2014-01-25 DIAGNOSIS — R509 Fever, unspecified: Secondary | ICD-10-CM | POA: Diagnosis present

## 2014-01-25 DIAGNOSIS — Z7982 Long term (current) use of aspirin: Secondary | ICD-10-CM | POA: Insufficient documentation

## 2014-01-25 DIAGNOSIS — J189 Pneumonia, unspecified organism: Secondary | ICD-10-CM

## 2014-01-25 DIAGNOSIS — I2510 Atherosclerotic heart disease of native coronary artery without angina pectoris: Secondary | ICD-10-CM | POA: Insufficient documentation

## 2014-01-25 DIAGNOSIS — F0280 Dementia in other diseases classified elsewhere without behavioral disturbance: Secondary | ICD-10-CM | POA: Insufficient documentation

## 2014-01-25 DIAGNOSIS — I6529 Occlusion and stenosis of unspecified carotid artery: Secondary | ICD-10-CM | POA: Insufficient documentation

## 2014-01-25 DIAGNOSIS — E785 Hyperlipidemia, unspecified: Secondary | ICD-10-CM | POA: Insufficient documentation

## 2014-01-25 DIAGNOSIS — J111 Influenza due to unidentified influenza virus with other respiratory manifestations: Principal | ICD-10-CM | POA: Insufficient documentation

## 2014-01-25 DIAGNOSIS — R0902 Hypoxemia: Secondary | ICD-10-CM

## 2014-01-25 DIAGNOSIS — M899 Disorder of bone, unspecified: Secondary | ICD-10-CM | POA: Insufficient documentation

## 2014-01-25 DIAGNOSIS — F05 Delirium due to known physiological condition: Secondary | ICD-10-CM | POA: Insufficient documentation

## 2014-01-25 DIAGNOSIS — I129 Hypertensive chronic kidney disease with stage 1 through stage 4 chronic kidney disease, or unspecified chronic kidney disease: Secondary | ICD-10-CM | POA: Insufficient documentation

## 2014-01-25 DIAGNOSIS — I498 Other specified cardiac arrhythmias: Secondary | ICD-10-CM | POA: Insufficient documentation

## 2014-01-25 DIAGNOSIS — M47812 Spondylosis without myelopathy or radiculopathy, cervical region: Secondary | ICD-10-CM | POA: Insufficient documentation

## 2014-01-25 DIAGNOSIS — N1330 Unspecified hydronephrosis: Secondary | ICD-10-CM | POA: Insufficient documentation

## 2014-01-25 DIAGNOSIS — G309 Alzheimer's disease, unspecified: Secondary | ICD-10-CM | POA: Insufficient documentation

## 2014-01-25 DIAGNOSIS — M949 Disorder of cartilage, unspecified: Secondary | ICD-10-CM

## 2014-01-25 DIAGNOSIS — F411 Generalized anxiety disorder: Secondary | ICD-10-CM | POA: Insufficient documentation

## 2014-01-25 LAB — URINALYSIS, ROUTINE W REFLEX MICROSCOPIC
Bilirubin Urine: NEGATIVE
GLUCOSE, UA: NEGATIVE mg/dL
Ketones, ur: NEGATIVE mg/dL
Leukocytes, UA: NEGATIVE
Nitrite: NEGATIVE
Protein, ur: NEGATIVE mg/dL
Specific Gravity, Urine: 1.019 (ref 1.005–1.030)
Urobilinogen, UA: 0.2 mg/dL (ref 0.0–1.0)
pH: 6.5 (ref 5.0–8.0)

## 2014-01-25 LAB — CBC WITH DIFFERENTIAL/PLATELET
BASOS PCT: 0 % (ref 0–1)
Basophils Absolute: 0 10*3/uL (ref 0.0–0.1)
EOS ABS: 0 10*3/uL (ref 0.0–0.7)
EOS PCT: 1 % (ref 0–5)
HCT: 35.3 % — ABNORMAL LOW (ref 36.0–46.0)
HEMOGLOBIN: 11.6 g/dL — AB (ref 12.0–15.0)
Lymphocytes Relative: 15 % (ref 12–46)
Lymphs Abs: 0.6 10*3/uL — ABNORMAL LOW (ref 0.7–4.0)
MCH: 31.6 pg (ref 26.0–34.0)
MCHC: 32.9 g/dL (ref 30.0–36.0)
MCV: 96.2 fL (ref 78.0–100.0)
MONO ABS: 0.7 10*3/uL (ref 0.1–1.0)
MONOS PCT: 17 % — AB (ref 3–12)
Neutro Abs: 2.8 10*3/uL (ref 1.7–7.7)
Neutrophils Relative %: 67 % (ref 43–77)
Platelets: 106 10*3/uL — ABNORMAL LOW (ref 150–400)
RBC: 3.67 MIL/uL — ABNORMAL LOW (ref 3.87–5.11)
RDW: 14 % (ref 11.5–15.5)
WBC: 4.2 10*3/uL (ref 4.0–10.5)

## 2014-01-25 LAB — COMPREHENSIVE METABOLIC PANEL
ALBUMIN: 3.2 g/dL — AB (ref 3.5–5.2)
ALT: 13 U/L (ref 0–35)
AST: 28 U/L (ref 0–37)
Alkaline Phosphatase: 58 U/L (ref 39–117)
BUN: 12 mg/dL (ref 6–23)
CALCIUM: 8.9 mg/dL (ref 8.4–10.5)
CO2: 25 mEq/L (ref 19–32)
CREATININE: 0.57 mg/dL (ref 0.50–1.10)
Chloride: 100 mEq/L (ref 96–112)
GFR calc Af Amer: >90 mL/min (ref >90)
GFR calc non Af Amer: 82 mL/min — ABNORMAL LOW (ref >90)
Glucose, Bld: 108 mg/dL — ABNORMAL HIGH (ref 70–99)
Potassium: 5.2 mEq/L (ref 3.7–5.3)
Sodium: 137 mEq/L (ref 137–147)
Total Bilirubin: <0.2 mg/dL — ABNORMAL LOW (ref 0.3–1.2)
Total Protein: 6.4 g/dL (ref 6.0–8.3)

## 2014-01-25 LAB — URINE MICROSCOPIC-ADD ON

## 2014-01-25 LAB — INFLUENZA PANEL BY PCR (TYPE A & B)
H1N1FLUPCR: NOT DETECTED
INFLAPCR: POSITIVE — AB
Influenza B By PCR: NEGATIVE

## 2014-01-25 MED ORDER — OSELTAMIVIR PHOSPHATE 75 MG PO CAPS
75.0000 mg | ORAL_CAPSULE | Freq: Once | ORAL | Status: AC
  Administered 2014-01-25: 75 mg via ORAL
  Filled 2014-01-25: qty 1

## 2014-01-25 MED ORDER — LEVOFLOXACIN IN D5W 750 MG/150ML IV SOLN
750.0000 mg | Freq: Once | INTRAVENOUS | Status: AC
  Administered 2014-01-25: 750 mg via INTRAVENOUS
  Filled 2014-01-25: qty 150

## 2014-01-25 MED ORDER — SODIUM CHLORIDE 0.9 % IV BOLUS (SEPSIS)
1000.0000 mL | Freq: Once | INTRAVENOUS | Status: AC
  Administered 2014-01-25: 1000 mL via INTRAVENOUS

## 2014-01-25 NOTE — Progress Notes (Signed)
Received pt report, given by Eddie Dibbles, RN.

## 2014-01-25 NOTE — ED Provider Notes (Signed)
CSN: [id number]     Arrival date & time 01/25/14  1904 History   First MD Initiated Contact with Patient 01/25/14 2028     Chief Complaint  Patient presents with  . Fever   (Consider location/radiation/quality/duration/timing/severity/associated sxs/prior Treatment) The history is provided by the patient and a relative.  KRYSTINA STRIETER is a 80 y.o. female hx of HTN, GERD, dementia, here with confusion, cough, and fever.  As per daughter she has been coughing since yesterday. Also fever 102 yesterday. As per daughter she is more confused today. She called the doctor's office and tamiflu was called in and she took a dose. In the afternoon, she still had fever and more confusion. She came to ER for evaluation.   Level V caveat- dementia    Past Medical History  Diagnosis Date  . Bladder cancer     s/p cystectomy/ureterostomy  . Hypertension   . S/P appendectomy   . GERD (gastroesophageal reflux disease)   . Hyperlipidemia   . Dementia   . SVT (supraventricular tachycardia)   . Vitamin D deficiency   . Carotid stenosis   . Osteopenia   . Syncope   . Anxiety   . Cervical disc disease   . Pyelonephritis   . LBBB (left bundle branch block)   . Ventricular tachycardia   . CAD (coronary artery disease)   . PVC (premature ventricular contraction)   . Headache(784.0)   . Cervical spondylosis   . Heartburn   . Hydronephrosis   . Hypoglycemia   . Rectocele   . Microscopic hematuria   . Heart murmur   . Jejunostomy tube fell out   . Fall at home Nov. 9, 2014    Pt was picking up her dog and fell   Past Surgical History  Procedure Laterality Date  . Total abdominal hysterectomy w/ bilateral salpingoophorectomy    . Hernia repair    . Cystectomy    . Ureterostomy    . Appendectom    . Cardiac catheterization    . Fracture surgery    . Abdominal hysterectomy     Family History  Problem Relation Age of Onset  . Lung cancer Father   . Cancer Father   . Heart attack Father    . Heart disease Mother     Heart Disease before age 76  . Allergies Mother   . Hypertension Mother   . Heart attack Mother   . Heart disease Sister   . Allergies Sister    History  Substance Use Topics  . Smoking status: Former Smoker -- 0.10 packs/day for 4 years    Types: Cigarettes    Quit date: 12/24/1949  . Smokeless tobacco: Never Used  . Alcohol Use: No   OB History   Grav Para Term Preterm Abortions TAB SAB Ect Mult Living                 Review of Systems  Constitutional: Positive for fever.  Psychiatric/Behavioral: Positive for confusion.  All other systems reviewed and are negative.    Allergies  Bethanechol chloride; Celecoxib; Citalopram hydrobromide; Clindamycin; Codeine; Desipramine; Epinephrine; Erythromycin; Floxin; Glucose; Hydromorphone hcl; Ibuprofen; Ibuprofen; Iodine; Meperidine hcl; Morphine; Paroxetine hcl; Penicillins; Procaine hcl; Propranolol hcl; Red dye; Restoril; Scopolamine; Sulfonamide derivatives; Tetracycline; Trazodone and nefazodone; and Valium  Home Medications   Current Outpatient Rx  Name  Route  Sig  Dispense  Refill  . amLODipine (NORVASC) 5 MG tablet   Oral   Take 5 mg  by mouth daily.         Marland Kitchen aspirin EC 81 MG tablet   Oral   Take 81 mg by mouth daily.         Marland Kitchen atenolol (TENORMIN) 25 MG tablet   Oral   Take 25 mg by mouth 2 (two) times daily.         . Calcium Carbonate (CALTRATE 600 PO)   Oral   Take 1 tablet by mouth daily.           . divalproex (DEPAKOTE) 250 MG DR tablet   Oral   Take 250 mg by mouth 2 (two) times daily.         Mariane Baumgarten Calcium (STOOL SOFTENER PO)   Oral   Take 1 capsule by mouth daily as needed (constipation).          Marland Kitchen donepezil (ARICEPT) 10 MG tablet   Oral   Take 10 mg by mouth daily.           . Memantine HCl ER (NAMENDA XR) 28 MG CP24   Oral   Take 28 mg by mouth daily.         . mometasone (ASMANEX 60 METERED DOSES) 220 MCG/INH inhaler   Inhalation    Inhale 2 puffs into the lungs at bedtime.   1 Inhaler   6   . Multiple Vitamin (MULTIVITAMIN) capsule   Oral   Take 1 capsule by mouth daily.           . nortriptyline (PAMELOR) 25 MG capsule   Oral   Take 25 mg by mouth at bedtime.          Earney Navy Bicarbonate (ZEGERID) 20-1100 MG CAPS capsule   Oral   Take 1 capsule by mouth 2 (two) times daily.         . pregabalin (LYRICA) 50 MG capsule   Oral   Take 50 mg by mouth 2 (two) times daily.          Marland Kitchen PREMARIN vaginal cream   Vaginal   Place 1 Applicatorful vaginally 2 (two) times a week.          . rosuvastatin (CRESTOR) 10 MG tablet   Oral   Take 10 mg by mouth every Monday, Wednesday, and Friday.          . traMADol (ULTRAM) 50 MG tablet   Oral   Take 25 mg by mouth every 12 (twelve) hours as needed for moderate pain.         Marland Kitchen zolpidem (AMBIEN CR) 12.5 MG CR tablet   Oral   Take 12.5 mg by mouth at bedtime.            BP 121/48  Pulse 82  Temp(Src) 99.5 F (37.5 C) (Oral)  Resp 16  SpO2 94% Physical Exam  Nursing note and vitals reviewed. Constitutional:  Chronically ill, confused, tired   HENT:  Head: Normocephalic.  Mouth/Throat: Oropharynx is clear and moist.  Eyes: Conjunctivae and EOM are normal. Pupils are equal, round, and reactive to light.  Neck: Normal range of motion. Neck supple.  Cardiovascular: Normal rate, regular rhythm and normal heart sounds.   Pulmonary/Chest: Effort normal.  Mild crackles R base   Abdominal: Soft. Bowel sounds are normal. She exhibits no distension. There is no tenderness. There is no rebound and no guarding.  Musculoskeletal: Normal range of motion.  Neurological: She is alert. No cranial nerve deficit. Coordination normal.  Demented, moving  all extremities   Skin: Skin is warm and dry.  Psychiatric: She has a normal mood and affect. Her behavior is normal. Judgment and thought content normal.    ED Course  Procedures (including  critical care time) Labs Review Labs Reviewed  CBC WITH DIFFERENTIAL - Abnormal; Notable for the following:    RBC 3.67 (*)    Hemoglobin 11.6 (*)    HCT 35.3 (*)    Platelets 106 (*)    Lymphs Abs 0.6 (*)    Monocytes Relative 17 (*)    All other components within normal limits  COMPREHENSIVE METABOLIC PANEL - Abnormal; Notable for the following:    Glucose, Bld 108 (*)    Albumin 3.2 (*)    Total Bilirubin <0.2 (*)    GFR calc non Af Amer 82 (*)    All other components within normal limits  URINALYSIS, ROUTINE W REFLEX MICROSCOPIC - Abnormal; Notable for the following:    APPearance HAZY (*)    Hgb urine dipstick SMALL (*)    All other components within normal limits  URINE MICROSCOPIC-ADD ON  INFLUENZA PANEL BY PCR (TYPE A & B, H1N1)   Imaging Review Dg Chest 2 View  01/25/2014   CLINICAL DATA:  Fever.  EXAM: CHEST  2 VIEW  COMPARISON:  08/02/2013  FINDINGS: Chronic cardiomegaly. Mitral annular calcification. No edema. No effusion (right lateral costophrenic sulcus blunting is likely from underpenetration when correlated with the lateral view). No definitive consolidation, with increased markings at the right base likely mild atelectasis.  IMPRESSION: No definitive pneumonia.   Electronically Signed   By: Jorje Guild M.D.   On: 01/25/2014 21:00    EKG Interpretation   None       MDM  No diagnosis found. LOVELLE HAUSKNECHT is a 80 y.o. female here with possible flu, confusion. Will swab for flu, do sepsis workup. She is borderline hypoxic to 93% on RA. Will get xray to r/o pneumonia.   9:48 PM CXR showed mild R base atelectasis and she had mild crackles R base. I discussed with Dr. Elsworth Soho regarding starting levaquin and admit. Agreed with levaquin, flu sent. Will come to admit patient.    Wandra Arthurs, MD 01/25/14 2150

## 2014-01-25 NOTE — H&P (Signed)
PCP:   Janalyn Rouse, MD   Chief Complaint:  Fever, altered mental status above baseline dementia  HPI: 80 year old Caucasian female who lives at home with the assistance of caregivers and her husband, with baseline dementia, crying more assistance in the evening hours, disoriented at baseline but able to dress herself, and answer questions appropriately during the day based on caregivers reports, was somewhat of a chronic cough, followed by pulmonology, treated with prednisone in the last several weeks if not months, complicated by history of hypertension GERD dementia CAD and remote bladder cancer with urostomy in place. Cough has been worse over the last one-2 days with fever up to 102F yesterday, family called memory care physician's office, Tamiflu prescribed, she did get one dose, 8 a full lunch, but became increasingly confused with persistent cough somewhat productive. Patient brought to the emergency room for evaluation. In emergency room chest x-ray unremarkable except for questionable early pneumonia versus atelectasis, patient remains hemodynamically stable but very confused compared with baseline, patient continued on Tamiflu, Levaquin initiated an IV fluids administrated, I was called for admission and further management. Please note patient had relative hypoxia with an O2 sat of 92-93% on room air.  Review of Systems:  Clearly unobtainable from the patient secondary to dementia and confusion however per caregiver, denies shaking chills or riders, complaints of pain, headaches, focal neurologic deficits, anorexia. They also deny chest pain, overt shortness of breath respiratory distress nausea, vomiting, change in bowel habits, blood in stool or urostomy output. They deny lower extremity edema, patient at baseline and ambulatory without assistance and able to perform minimal ADLs especially during the day. Caregiver denies overt swallowing difficulties. Positive for fever confusion  Past  Medical History: Past Medical History  Diagnosis Date  . Bladder cancer     s/p cystectomy/ureterostomy  . Hypertension   . S/P appendectomy   . GERD (gastroesophageal reflux disease)   . Hyperlipidemia   . Dementia   . SVT (supraventricular tachycardia)   . Vitamin D deficiency   . Carotid stenosis   . Osteopenia   . Syncope   . Anxiety   . Cervical disc disease   . Pyelonephritis   . LBBB (left bundle branch block)   . Ventricular tachycardia   . CAD (coronary artery disease)   . PVC (premature ventricular contraction)   . Headache(784.0)   . Cervical spondylosis   . Heartburn   . Hydronephrosis   . Hypoglycemia   . Rectocele   . Microscopic hematuria   . Heart murmur   . Jejunostomy tube fell out   . Fall at home Nov. 9, 2014    Pt was picking up her dog and fell   Past Surgical History  Procedure Laterality Date  . Total abdominal hysterectomy w/ bilateral salpingoophorectomy    . Hernia repair    . Cystectomy    . Ureterostomy    . Appendectom    . Cardiac catheterization    . Fracture surgery    . Abdominal hysterectomy      Medications: Prior to Admission medications   Medication Sig Start Date End Date Taking? Authorizing Provider  amLODipine (NORVASC) 5 MG tablet Take 5 mg by mouth daily.   Yes Historical Provider, MD  aspirin EC 81 MG tablet Take 81 mg by mouth daily.   Yes Historical Provider, MD  atenolol (TENORMIN) 25 MG tablet Take 25 mg by mouth 2 (two) times daily.   Yes Historical Provider, MD  Calcium Carbonate (CALTRATE  600 PO) Take 1 tablet by mouth daily.     Yes Historical Provider, MD  divalproex (DEPAKOTE) 250 MG DR tablet Take 250 mg by mouth 2 (two) times daily. 11/04/13  Yes Historical Provider, MD  Docusate Calcium (STOOL SOFTENER PO) Take 1 capsule by mouth daily as needed (constipation).    Yes Historical Provider, MD  donepezil (ARICEPT) 10 MG tablet Take 10 mg by mouth daily.     Yes Historical Provider, MD  Memantine HCl ER  (NAMENDA XR) 28 MG CP24 Take 28 mg by mouth daily.   Yes Historical Provider, MD  mometasone (ASMANEX 60 METERED DOSES) 220 MCG/INH inhaler Inhale 2 puffs into the lungs at bedtime. 01/01/14  Yes Kathee Delton, MD  Multiple Vitamin (MULTIVITAMIN) capsule Take 1 capsule by mouth daily.     Yes Historical Provider, MD  nortriptyline (PAMELOR) 25 MG capsule Take 25 mg by mouth at bedtime.  09/04/12  Yes Historical Provider, MD  Omeprazole-Sodium Bicarbonate (ZEGERID) 20-1100 MG CAPS capsule Take 1 capsule by mouth 2 (two) times daily.   Yes Historical Provider, MD  pregabalin (LYRICA) 50 MG capsule Take 50 mg by mouth 2 (two) times daily.    Yes Historical Provider, MD  PREMARIN vaginal cream Place 1 Applicatorful vaginally 2 (two) times a week.  08/12/12  Yes Historical Provider, MD  rosuvastatin (CRESTOR) 10 MG tablet Take 10 mg by mouth every Monday, Wednesday, and Friday.    Yes Historical Provider, MD  traMADol (ULTRAM) 50 MG tablet Take 25 mg by mouth every 12 (twelve) hours as needed for moderate pain.   Yes Historical Provider, MD  zolpidem (AMBIEN CR) 12.5 MG CR tablet Take 12.5 mg by mouth at bedtime.     Yes Historical Provider, MD    Allergies:   Allergies  Allergen Reactions  . Bethanechol Chloride     Chest pains. Coronary spasms  . Celecoxib     Celebrex.  Severe itching.   . Citalopram Hydrobromide     Hyper, nervousness  . Clindamycin     Itching, coughing  . Codeine     tachycardia  . Desipramine     Severe tachycardia  . Epinephrine     Severe shortness of breath.   . Erythromycin     Burning, itching  . Floxin [Ocuflox]     itching  . Glucose     Sugar.  Weakness,faintness  . Hydromorphone Hcl     Dilaudid.  Comatose-Anaphylactic reaction.   . Ibuprofen     Tachycardia. weakness  . Ibuprofen     Tachycardia, weakness  . Iodine     Hives, edema, tachycardia  . Meperidine Hcl     Demerol.  Tachycardia, extreme nervousness.   . Morphine     Tachycardia,  hallucinations, extreme nervousness.  . Paroxetine Hcl     Extreme nervousness  . Penicillins     Severe edema, severe difficulty breathing, anaphylactic reaction.  . Procaine Hcl     Novocain. Severe shortness of breath.  . Propranolol Hcl     Inderal.  Extreme low blood pressure.   . Red Dye     Severe itching  . Restoril     Temazepam.  Extreme nervousness, sleeplessness  . Scopolamine     Irrational, loss of memory  . Sulfonamide Derivatives     Hives, edema  . Tetracycline     Severe hives, edema  . Trazodone And Nefazodone     Very hyper  . Valium  itching    Social History:  reports that she quit smoking about 64 years ago. Her smoking use included Cigarettes. She has a .4 pack-year smoking history. She has never used smokeless tobacco. She reports that she does not drink alcohol or use illicit drugs. Patient is married, lives with husband, supported by caregiver, family close by Family History: Family History  Problem Relation Age of Onset  . Lung cancer Father   . Cancer Father   . Heart attack Father   . Heart disease Mother     Heart Disease before age 71  . Allergies Mother   . Hypertension Mother   . Heart attack Mother   . Heart disease Sister   . Allergies Sister     Physical Exam: Filed Vitals:   01/25/14 1909 01/25/14 2219  BP: 121/48 145/47  Pulse: 82 86  Temp: 99.5 F (37.5 C)   TempSrc: Oral   Resp: 16 18  SpO2: 94% 96%   Sitting in bed, clearly confused, coughing with moist cough but mostly nonproductive with no audible wheezing, not answering questions appropriately but able to follow some simple commands chronically ill-appearing Sclera anicteric extraconal movements appear intact pupils are equal and round and reactive to light Tympanic membranes clear except for minor cerumen especially on the right No oropharyngeal lesions Oral mucosa moist Neck supple, no cervical lymphadenopathy, range of motion intact Lungs reveal coarse  breath sounds and rhonchi at the bases, but no respiratory distress Cardiovascular reveals regular rate and rhythm No axillary lymphadenopathy Abdomen-soft, nontender, nondistended, bowel sounds are present, urostomy intact clean dry nontender No edema, pedal pulses and radial pulses intact and symmetrical in full Skin is warm and dry Patient can move all 4 extremities, with simple commands, no impairment, no resting tremors, tone intact Alert but disoriented, normal mood, some unintelligible responses   Labs on Admission:   Recent Labs  01/25/14 1920  NA 137  K 5.2  CL 100  CO2 25  GLUCOSE 108*  BUN 12  CREATININE 0.57  CALCIUM 8.9    Recent Labs  01/25/14 1920  AST 28  ALT 13  ALKPHOS 58  BILITOT <0.2*  PROT 6.4  ALBUMIN 3.2*   No results found for this basename: LIPASE, AMYLASE,  in the last 72 hours  Recent Labs  01/25/14 1920  WBC 4.2  NEUTROABS 2.8  HGB 11.6*  HCT 35.3*  MCV 96.2  PLT 106*   No results found for this basename: CKTOTAL, CKMB, CKMBINDEX, TROPONINI,  in the last 72 hours No results found for this basename: TSH, T4TOTAL, FREET3, T3FREE, THYROIDAB,  in the last 72 hours No results found for this basename: VITAMINB12, FOLATE, FERRITIN, TIBC, IRON, RETICCTPCT,  in the last 72 hours  Radiological Exams on Admission: Dg Chest 2 View  01/25/2014   CLINICAL DATA:  Fever.  EXAM: CHEST  2 VIEW  COMPARISON:  08/02/2013  FINDINGS: Chronic cardiomegaly. Mitral annular calcification. No edema. No effusion (right lateral costophrenic sulcus blunting is likely from underpenetration when correlated with the lateral view). No definitive consolidation, with increased markings at the right base likely mild atelectasis.  IMPRESSION: No definitive pneumonia.   Electronically Signed   By: Jorje Guild M.D.   On: 01/25/2014 21:00   Dg Swallowing Func-speech Pathology  01/12/2014   Macario Golds, CCC-SLP     01/12/2014  2:43 PM Objective Swallowing Evaluation:  Modified Barium Swallowing Study   Patient Details  Name: BEOLA VOLMAR MRN: [id number] Date of  Birth: 1926-10-01  Today's Date: 01/12/2014 Time: 5638-7564 SLP Time Calculation (min): 24 min  Past Medical History:  Past Medical History  Diagnosis Date  . Bladder cancer     s/p cystectomy/ureterostomy  . Hypertension   . S/P appendectomy   . GERD (gastroesophageal reflux disease)   . Hyperlipidemia   . Dementia   . SVT (supraventricular tachycardia)   . Vitamin D deficiency   . Carotid stenosis   . Osteopenia   . Syncope   . Anxiety   . Cervical disc disease   . Pyelonephritis   . LBBB (left bundle branch block)   . Ventricular tachycardia   . CAD (coronary artery disease)   . PVC (premature ventricular contraction)   . Headache(784.0)   . Cervical spondylosis   . Heartburn   . Hydronephrosis   . Hypoglycemia   . Rectocele   . Microscopic hematuria   . Heart murmur   . Jejunostomy tube fell out   . Fall at home Nov. 9, 2014    Pt was picking up her dog and fell   Past Surgical History:  Past Surgical History  Procedure Laterality Date  . Total abdominal hysterectomy w/ bilateral salpingoophorectomy     . Hernia repair    . Cystectomy    . Ureterostomy    . Appendectom    . Cardiac catheterization    . Fracture surgery    . Abdominal hysterectomy     HPI:  80 yo female referred by Dr Gwenette Greet for MBS due to pt having  chronic cough - including cough when eating/drinking. Pt PMH +  for rhinitis, reflux, and advancing dementia.  Pt also has a past  medical history of Bladder cancer; Hypertension; S/P  appendectomy; GERD (gastroesophageal reflux disease);  Hyperlipidemia; Dementia; SVT (supraventricular tachycardia);  Vitamin D deficiency; Carotid stenosis; Osteopenia; Syncope;  Anxiety; Cervical disc disease; Pyelonephritis; LBBB (left bundle  branch block); Ventricular tachycardia; CAD (coronary artery  disease); PVC (premature ventricular contraction);  Headache(784.0); Cervical spondylosis; Heartburn; Hydronephrosis;   Hypoglycemia; Rectocele; Microscopic hematuria; and Heart murmur.  per Dr Chase Caller office note.  Pt with chronic cough and has been  treated for reflux and allergies without significant improvement  from review of MD note.         Assessment / Plan / Recommendation Clinical Impression  Dysphagia Diagnosis: Within Functional Limits Clinical impression: Pt presents with functional swallow without  aspiration or penetration of any consistency tested.  Pt noted to  clear her throat before testing and cough once during MBS.   Swallow was timely with clear voice throughout.  Minimal  pharyngeal residual noted with thin liqud on 2 occasions,  clearing with dry swallow (reflexive swallow).  Pt swallowed a  barium tablet with thin and it readily transited through  esophagus- although she reported sensation of barium tablet  lodging in pharynx.  Educated pt and spouse to precautions and  strategies to ease comfort with intake.  Recommend regular/thin  with dry swallow if pt senses stasis.     Treatment Recommendation  No treatment recommended at this time    Diet Recommendation Regular;Thin liquid   Liquid Administration via: Cup;Straw Medication Administration: Whole meds with liquid Supervision: Patient able to self feed Compensations:  (intermittent dry swallow if pt senses pharyngeal  stasis) Postural Changes and/or Swallow Maneuvers: Seated upright 90  degrees;Upright 30-60 min after meal    Other  Recommendations Oral Care Recommendations: Oral care BID  SLP Swallow Goals     General Date of Onset: 01/12/14 HPI: 80 yo female referred by Dr Gwenette Greet for MBS due to pt having  chronic cough - including cough when eating/drinking. Pt PMH +  for rhinitis, reflux, and advancing dementia.  Pt also has a past  medical history of Bladder cancer; Hypertension; S/P  appendectomy; GERD (gastroesophageal reflux disease);  Hyperlipidemia; Dementia; SVT (supraventricular tachycardia);  Vitamin D deficiency; Carotid  stenosis; Osteopenia; Syncope;  Anxiety; Cervical disc disease; Pyelonephritis; LBBB (left bundle  branch block); Ventricular tachycardia; CAD (coronary artery  disease); PVC (premature ventricular contraction);  Headache(784.0); Cervical spondylosis; Heartburn; Hydronephrosis;  Hypoglycemia; Rectocele; Microscopic hematuria; and Heart murmur.  per Dr Chase Caller office note.  Pt with chronic cough and has been  treated for reflux and allergies without significant improvement  from review of MD note.     Type of Study: Modified Barium Swallowing Study Reason for Referral: Objectively evaluate swallowing function Diet Prior to this Study: Regular;Thin liquids Temperature Spikes Noted: No Respiratory Status: Room air History of Recent Intubation: No Behavior/Cognition: Alert;Cooperative;Pleasant mood Oral Cavity - Dentition: Adequate natural dentition Oral Motor / Sensory Function: Within functional limits Self-Feeding Abilities: Able to feed self Patient Positioning: Upright in chair Baseline Vocal Quality: Clear Volitional Cough: Strong Volitional Swallow: Able to elicit Anatomy: Within functional limits    Reason for Referral Objectively evaluate swallowing function   Oral Phase Oral Preparation/Oral Phase Oral Phase: WFL Oral - Nectar Oral - Nectar Cup: Within functional limits Oral - Thin Oral - Thin Cup: Within functional limits Oral - Thin Straw: Within functional limits Oral - Solids Oral - Puree: Within functional limits Oral - Regular: Within functional limits Oral - Pill: Within functional limits   Pharyngeal Phase Pharyngeal Phase Pharyngeal Phase: Within functional limits Pharyngeal - Nectar Pharyngeal - Nectar Cup: Within functional limits Pharyngeal - Thin Pharyngeal - Thin Cup: Within functional limits;Pharyngeal  residue - valleculae Pharyngeal - Thin Straw: Within functional limits Pharyngeal - Solids Pharyngeal - Puree: Within functional limits Pharyngeal - Regular: Within functional limits Pharyngeal  - Pill: Within functional limits  Cervical Esophageal Phase    GO    Cervical Esophageal Phase Cervical Esophageal Phase: Impaired Cervical Esophageal Phase - Nectar Nectar Cup: Within functional limits Cervical Esophageal Phase - Thin Thin Cup: Within functional limits Thin Straw: Within functional limits Cervical Esophageal Phase - Solids Puree: Within functional limits Regular: Within functional limits Pill: Within functional limits Cervical Esophageal Phase - Comment Cervical Esophageal Comment: Appearance of minimal delay  clearance of pudding at midesophagus -without pt awareness.   Liquid swallow facilitated clearance but with appearance of  minimal backflow.  Barium tablet readily transited through  esophagus.  Radiologist not present to confirm.      Functional Assessment Tool Used: mbs, clinical judgement Functional Limitations: Swallowing Swallow Current Status [id number]): At least 1 percent but less than  20 percent impaired, limited or restricted Swallow Goal Status [phone]): At least 1 percent but less than 20  percent impaired, limited or restricted Swallow Discharge Status [phone]): At least 1 percent but less  than 20 percent impaired, limited or restricted    Luanna Salk, [facility] SLP [phone]      Assessment/Plan Fever with cough-unclear etiology however pulmonary rhonchi are auscultated, minimal hypoxia on room air, will continue oxygen, Levaquin empirically, IV fluids, mucolytic agents, and empiric treatment for influenza pending PCR. given responsiveness of cough to prednisone will also start prednisone 20 mg each day. Dementia with worsening mental  status-historically on multiple medications with minimal help her family report, does need assistance especially at nighttime, now with worsening mental status presumably secondary to infectious etiology, no history of recent falls or focal neurologic deficits, if worsening mental status, low threshold to scan head CAD-on appropriate medications,  hemodynamically stable with no evidence of volume overload at this time Hypertension-hemodynamically stable, blood pressure controlled will continue current medications Bladder cancer with urostomy-urinalysis unremarkable GERD will continue PPI Use of inhaled steroids for subacute cough, followed by pulmonology, recent PFTs no obstruction, no restriction, question positive response to prednisone no response to oral antihistamines or nasal steroids given a trial of Singulair and Asmanex last November, recent speech therapy evaluation is as above-unremarkable  DVT prophylaxis CODE STATUS per PCP-unknown by granddaughter and caregiver who are present in the exam room Emilyanne Mcgough R 01/25/2014, 10:49 PM

## 2014-01-25 NOTE — ED Notes (Addendum)
Pt. arrived with EMS from home , family reported fever with chills, productive cough with increased confusion ( history of Alzheimer's dementia  ) , slight slurred speech onset yesterday . Pt. confused at arrival , speech clear / no facial asymmetry , equal grips with no arm drift. Respirations unlabored / denies pain .

## 2014-01-26 ENCOUNTER — Observation Stay (HOSPITAL_COMMUNITY): Payer: Medicare Other

## 2014-01-26 DIAGNOSIS — F05 Delirium due to known physiological condition: Secondary | ICD-10-CM | POA: Diagnosis present

## 2014-01-26 DIAGNOSIS — J111 Influenza due to unidentified influenza virus with other respiratory manifestations: Secondary | ICD-10-CM | POA: Diagnosis present

## 2014-01-26 DIAGNOSIS — N183 Chronic kidney disease, stage 3 unspecified: Secondary | ICD-10-CM | POA: Diagnosis present

## 2014-01-26 LAB — CBC
HCT: 35.9 % — ABNORMAL LOW (ref 36.0–46.0)
HEMOGLOBIN: 11.8 g/dL — AB (ref 12.0–15.0)
MCH: 31.8 pg (ref 26.0–34.0)
MCHC: 32.9 g/dL (ref 30.0–36.0)
MCV: 96.8 fL (ref 78.0–100.0)
PLATELETS: 93 10*3/uL — AB (ref 150–400)
RBC: 3.71 MIL/uL — ABNORMAL LOW (ref 3.87–5.11)
RDW: 14.2 % (ref 11.5–15.5)
WBC: 4.3 10*3/uL (ref 4.0–10.5)

## 2014-01-26 LAB — COMPREHENSIVE METABOLIC PANEL
ALT: 12 U/L (ref 0–35)
AST: 18 U/L (ref 0–37)
Albumin: 2.9 g/dL — ABNORMAL LOW (ref 3.5–5.2)
Alkaline Phosphatase: 50 U/L (ref 39–117)
BUN: 8 mg/dL (ref 6–23)
CALCIUM: 8.3 mg/dL — AB (ref 8.4–10.5)
CHLORIDE: 99 meq/L (ref 96–112)
CO2: 23 meq/L (ref 19–32)
Creatinine, Ser: 0.54 mg/dL (ref 0.50–1.10)
GFR calc Af Amer: >90 mL/min (ref >90)
GFR, EST NON AFRICAN AMERICAN: 84 mL/min — AB (ref >90)
Glucose, Bld: 84 mg/dL (ref 70–99)
Potassium: 4.1 mEq/L (ref 3.7–5.3)
SODIUM: 136 meq/L — AB (ref 137–147)
Total Bilirubin: <0.2 mg/dL — ABNORMAL LOW (ref 0.3–1.2)
Total Protein: 5.8 g/dL — ABNORMAL LOW (ref 6.0–8.3)

## 2014-01-26 MED ORDER — CALCIUM CARBONATE 1250 (500 CA) MG PO TABS
1.0000 | ORAL_TABLET | Freq: Every day | ORAL | Status: DC
  Administered 2014-01-26 – 2014-01-27 (×2): 500 mg via ORAL
  Filled 2014-01-26 (×2): qty 1

## 2014-01-26 MED ORDER — DOCUSATE SODIUM 100 MG PO CAPS: 100.0000 mg | ORAL_CAPSULE | Freq: Every day | ORAL | Status: DC | PRN

## 2014-01-26 MED ORDER — AMLODIPINE BESYLATE 5 MG PO TABS
5.0000 mg | ORAL_TABLET | Freq: Every day | ORAL | Status: DC
  Administered 2014-01-26 – 2014-01-27 (×2): 5 mg via ORAL
  Filled 2014-01-26 (×2): qty 1

## 2014-01-26 MED ORDER — ESTROGENS, CONJUGATED 0.625 MG/GM VA CREA
1.0000 | TOPICAL_CREAM | VAGINAL | Status: DC
  Filled 2014-01-26: qty 42.5

## 2014-01-26 MED ORDER — ASPIRIN EC 81 MG PO TBEC
81.0000 mg | DELAYED_RELEASE_TABLET | Freq: Every day | ORAL | Status: DC
  Administered 2014-01-26 – 2014-01-27 (×2): 81 mg via ORAL
  Filled 2014-01-26 (×2): qty 1

## 2014-01-26 MED ORDER — PREDNISONE 20 MG PO TABS
20.0000 mg | ORAL_TABLET | Freq: Every day | ORAL | Status: DC
  Administered 2014-01-26 – 2014-01-27 (×2): 20 mg via ORAL
  Filled 2014-01-26 (×3): qty 1

## 2014-01-26 MED ORDER — ACETAMINOPHEN 650 MG RE SUPP: 650.0000 mg | Freq: Four times a day (QID) | RECTAL | Status: DC | PRN

## 2014-01-26 MED ORDER — ENOXAPARIN SODIUM 40 MG/0.4ML ~~LOC~~ SOLN
40.0000 mg | SUBCUTANEOUS | Status: DC
  Administered 2014-01-26 – 2014-01-27 (×2): 40 mg via SUBCUTANEOUS
  Filled 2014-01-26 (×2): qty 0.4

## 2014-01-26 MED ORDER — LEVOFLOXACIN 500 MG PO TABS
500.0000 mg | ORAL_TABLET | Freq: Every day | ORAL | Status: DC
  Administered 2014-01-26 – 2014-01-27 (×2): 500 mg via ORAL
  Filled 2014-01-26 (×2): qty 1

## 2014-01-26 MED ORDER — OSELTAMIVIR PHOSPHATE 30 MG PO CAPS
30.0000 mg | ORAL_CAPSULE | Freq: Two times a day (BID) | ORAL | Status: DC
  Administered 2014-01-26 – 2014-01-27 (×3): 30 mg via ORAL
  Filled 2014-01-26 (×4): qty 1

## 2014-01-26 MED ORDER — ZOLPIDEM TARTRATE 5 MG PO TABS: 5.0000 mg | ORAL_TABLET | Freq: Every evening | ORAL | Status: DC | PRN

## 2014-01-26 MED ORDER — PREGABALIN 50 MG PO CAPS
50.0000 mg | ORAL_CAPSULE | Freq: Two times a day (BID) | ORAL | Status: DC
  Administered 2014-01-26 – 2014-01-27 (×3): 50 mg via ORAL
  Filled 2014-01-26 (×3): qty 1

## 2014-01-26 MED ORDER — ACETAMINOPHEN 325 MG PO TABS: 650.0000 mg | ORAL_TABLET | Freq: Four times a day (QID) | ORAL | Status: DC | PRN

## 2014-01-26 MED ORDER — MEMANTINE HCL ER 28 MG PO CP24
28.0000 mg | ORAL_CAPSULE | Freq: Every day | ORAL | Status: DC
  Administered 2014-01-26 – 2014-01-27 (×2): 28 mg via ORAL
  Filled 2014-01-26 (×2): qty 28

## 2014-01-26 MED ORDER — TRAMADOL HCL 50 MG PO TABS: 25.0000 mg | ORAL_TABLET | Freq: Two times a day (BID) | ORAL | Status: DC | PRN

## 2014-01-26 MED ORDER — ATENOLOL 25 MG PO TABS
25.0000 mg | ORAL_TABLET | Freq: Two times a day (BID) | ORAL | Status: DC
  Administered 2014-01-26 – 2014-01-27 (×2): 25 mg via ORAL
  Filled 2014-01-26 (×5): qty 1

## 2014-01-26 MED ORDER — PANTOPRAZOLE SODIUM 40 MG PO TBEC
40.0000 mg | DELAYED_RELEASE_TABLET | Freq: Every day | ORAL | Status: DC
  Administered 2014-01-26 – 2014-01-27 (×2): 40 mg via ORAL
  Filled 2014-01-26 (×2): qty 1

## 2014-01-26 MED ORDER — SODIUM CHLORIDE 0.9 % IV SOLN
INTRAVENOUS | Status: DC
  Administered 2014-01-26 (×2): via INTRAVENOUS

## 2014-01-26 MED ORDER — DONEPEZIL HCL 10 MG PO TABS
10.0000 mg | ORAL_TABLET | Freq: Every day | ORAL | Status: DC
  Administered 2014-01-26 – 2014-01-27 (×2): 10 mg via ORAL
  Filled 2014-01-26 (×2): qty 1

## 2014-01-26 MED ORDER — DIVALPROEX SODIUM 250 MG PO DR TAB
250.0000 mg | DELAYED_RELEASE_TABLET | Freq: Two times a day (BID) | ORAL | Status: DC
  Administered 2014-01-26 – 2014-01-27 (×4): 250 mg via ORAL
  Filled 2014-01-26 (×5): qty 1

## 2014-01-26 MED ORDER — LEVOFLOXACIN IN D5W 750 MG/150ML IV SOLN: 750.0000 mg | INTRAVENOUS | Status: DC

## 2014-01-26 MED ORDER — ADULT MULTIVITAMIN W/MINERALS CH
1.0000 | ORAL_TABLET | Freq: Every day | ORAL | Status: DC
  Administered 2014-01-26 – 2014-01-27 (×2): 1 via ORAL
  Filled 2014-01-26 (×2): qty 1

## 2014-01-26 MED ORDER — ATORVASTATIN CALCIUM 20 MG PO TABS
20.0000 mg | ORAL_TABLET | Freq: Every day | ORAL | Status: DC
  Administered 2014-01-26: 20 mg via ORAL
  Filled 2014-01-26 (×2): qty 1

## 2014-01-26 MED ORDER — NORTRIPTYLINE HCL 25 MG PO CAPS
25.0000 mg | ORAL_CAPSULE | Freq: Every day | ORAL | Status: DC
  Administered 2014-01-26 (×2): 25 mg via ORAL
  Filled 2014-01-26 (×4): qty 1

## 2014-01-26 NOTE — Progress Notes (Signed)
UR completed 

## 2014-01-26 NOTE — Progress Notes (Signed)
[name] is an 80 y.o. female.   Chief Complaint: fever HPI: HTN, GERD, CAD, Dementia, Remote Bladder CA with urostomy in place  Past Medical History  Diagnosis Date  . Bladder cancer     s/p cystectomy/ureterostomy  . Hypertension   . S/P appendectomy   . GERD (gastroesophageal reflux disease)   . Hyperlipidemia   . Dementia   . SVT (supraventricular tachycardia)   . Vitamin D deficiency   . Carotid stenosis   . Osteopenia   . Syncope   . Anxiety   . Cervical disc disease   . Pyelonephritis   . LBBB (left bundle branch block)   . Ventricular tachycardia   . CAD (coronary artery disease)   . PVC (premature ventricular contraction)   . Headache(784.0)   . Cervical spondylosis   . Heartburn   . Hydronephrosis   . Hypoglycemia   . Rectocele   . Microscopic hematuria   . Heart murmur   . Jejunostomy tube fell out   . Fall at home Nov. 9, 2014    Pt was picking up her dog and fell    Past Surgical History  Procedure Laterality Date  . Total abdominal hysterectomy w/ bilateral salpingoophorectomy    . Hernia repair    . Cystectomy    . Ureterostomy    . Appendectom    . Cardiac catheterization    . Fracture surgery    . Abdominal hysterectomy      Family History  Problem Relation Age of Onset  . Lung cancer Father   . Cancer Father   . Heart attack Father   . Heart disease Mother     Heart Disease before age 38  . Allergies Mother   . Hypertension Mother   . Heart attack Mother   . Heart disease Sister   . Allergies Sister    Social History:  reports that she quit smoking about 64 years ago. Her smoking use included Cigarettes. She has a .4 pack-year smoking history. She has never used smokeless tobacco. She reports that she does not drink alcohol or use illicit drugs.  Allergies:  Allergies  Allergen Reactions  . Bethanechol Chloride     Chest pains. Coronary spasms  . Celecoxib     Celebrex.  Severe itching.   . Citalopram Hydrobromide    Hyper, nervousness  . Clindamycin     Itching, coughing  . Codeine     tachycardia  . Desipramine     Severe tachycardia  . Epinephrine     Severe shortness of breath.   . Erythromycin     Burning, itching  . Floxin [Ocuflox]     itching  . Glucose     Sugar.  Weakness,faintness  . Hydromorphone Hcl     Dilaudid.  Comatose-Anaphylactic reaction.   . Ibuprofen     Tachycardia. weakness  . Ibuprofen     Tachycardia, weakness  . Iodine     Hives, edema, tachycardia  . Meperidine Hcl     Demerol.  Tachycardia, extreme nervousness.   . Morphine     Tachycardia, hallucinations, extreme nervousness.  . Paroxetine Hcl     Extreme nervousness  . Penicillins     Severe edema, severe difficulty breathing, anaphylactic reaction.  . Procaine Hcl     Novocain. Severe shortness of breath.  . Propranolol Hcl     Inderal.  Extreme low blood pressure.   . Red Dye     Severe itching  .  Restoril     Temazepam.  Extreme nervousness, sleeplessness  . Scopolamine     Irrational, loss of memory  . Sulfonamide Derivatives     Hives, edema  . Tetracycline     Severe hives, edema  . Trazodone And Nefazodone     Very hyper  . Valium     itching    Medications Prior to Admission  Medication Sig Dispense Refill  . amLODipine (NORVASC) 5 MG tablet Take 5 mg by mouth daily.      Marland Kitchen aspirin EC 81 MG tablet Take 81 mg by mouth daily.      Marland Kitchen atenolol (TENORMIN) 25 MG tablet Take 25 mg by mouth 2 (two) times daily.      . Calcium Carbonate (CALTRATE 600 PO) Take 1 tablet by mouth daily.        . divalproex (DEPAKOTE) 250 MG DR tablet Take 250 mg by mouth 2 (two) times daily.      Mariane Baumgarten Calcium (STOOL SOFTENER PO) Take 1 capsule by mouth daily as needed (constipation).       Marland Kitchen donepezil (ARICEPT) 10 MG tablet Take 10 mg by mouth daily.        . Memantine HCl ER (NAMENDA XR) 28 MG CP24 Take 28 mg by mouth daily.      . mometasone (ASMANEX 60 METERED DOSES) 220 MCG/INH inhaler Inhale 2  puffs into the lungs at bedtime.  1 Inhaler  6  . Multiple Vitamin (MULTIVITAMIN) capsule Take 1 capsule by mouth daily.        . nortriptyline (PAMELOR) 25 MG capsule Take 25 mg by mouth at bedtime.       Earney Navy Bicarbonate (ZEGERID) 20-1100 MG CAPS capsule Take 1 capsule by mouth 2 (two) times daily.      . pregabalin (LYRICA) 50 MG capsule Take 50 mg by mouth 2 (two) times daily.       Marland Kitchen PREMARIN vaginal cream Place 1 Applicatorful vaginally 2 (two) times a week.       . rosuvastatin (CRESTOR) 10 MG tablet Take 10 mg by mouth every Monday, Wednesday, and Friday.       . traMADol (ULTRAM) 50 MG tablet Take 25 mg by mouth every 12 (twelve) hours as needed for moderate pain.      Marland Kitchen zolpidem (AMBIEN CR) 12.5 MG CR tablet Take 12.5 mg by mouth at bedtime.          Results for orders placed during the hospital encounter of 01/25/14 (from the past 48 hour(s))  CBC WITH DIFFERENTIAL     Status: Abnormal   Collection Time    01/25/14  7:20 PM      Result Value Range   WBC 4.2  4.0 - 10.5 K/uL   RBC 3.67 (*) 3.87 - 5.11 MIL/uL   Hemoglobin 11.6 (*) 12.0 - 15.0 g/dL   HCT 35.3 (*) 36.0 - 46.0 %   MCV 96.2  78.0 - 100.0 fL   MCH 31.6  26.0 - 34.0 pg   MCHC 32.9  30.0 - 36.0 g/dL   RDW 14.0  11.5 - 15.5 %   Platelets 106 (*) 150 - 400 K/uL   Comment: REPEATED TO VERIFY     SPECIMEN CHECKED FOR CLOTS     PLATELET COUNT CONFIRMED BY SMEAR   Neutrophils Relative % 67  43 - 77 %   Neutro Abs 2.8  1.7 - 7.7 K/uL   Lymphocytes Relative 15  12 - 46 %  Lymphs Abs 0.6 (*) 0.7 - 4.0 K/uL   Monocytes Relative 17 (*) 3 - 12 %   Monocytes Absolute 0.7  0.1 - 1.0 K/uL   Eosinophils Relative 1  0 - 5 %   Eosinophils Absolute 0.0  0.0 - 0.7 K/uL   Basophils Relative 0  0 - 1 %   Basophils Absolute 0.0  0.0 - 0.1 K/uL  COMPREHENSIVE METABOLIC PANEL     Status: Abnormal   Collection Time    01/25/14  7:20 PM      Result Value Range   Sodium 137  137 - 147 mEq/L   Potassium 5.2  3.7 - 5.3  mEq/L   Comment: HEMOLYSIS AT THIS LEVEL MAY AFFECT RESULT   Chloride 100  96 - 112 mEq/L   CO2 25  19 - 32 mEq/L   Glucose, Bld 108 (*) 70 - 99 mg/dL   BUN 12  6 - 23 mg/dL   Creatinine, Ser 0.57  0.50 - 1.10 mg/dL   Calcium 8.9  8.4 - 10.5 mg/dL   Total Protein 6.4  6.0 - 8.3 g/dL   Albumin 3.2 (*) 3.5 - 5.2 g/dL   AST 28  0 - 37 U/L   Comment: HEMOLYSIS AT THIS LEVEL MAY AFFECT RESULT   ALT 13  0 - 35 U/L   Comment: HEMOLYSIS AT THIS LEVEL MAY AFFECT RESULT   Alkaline Phosphatase 58  39 - 117 U/L   Total Bilirubin <0.2 (*) 0.3 - 1.2 mg/dL   GFR calc non Af Amer 82 (*) >90 mL/min   GFR calc Af Amer >90  >90 mL/min   Comment: (NOTE)     The eGFR has been calculated using the CKD EPI equation.     This calculation has not been validated in all clinical situations.     eGFR's persistently <90 mL/min signify possible Chronic Kidney     Disease.  URINALYSIS, ROUTINE W REFLEX MICROSCOPIC     Status: Abnormal   Collection Time    01/25/14  8:15 PM      Result Value Range   Color, Urine YELLOW  YELLOW   APPearance HAZY (*) CLEAR   Specific Gravity, Urine 1.019  1.005 - 1.030   pH 6.5  5.0 - 8.0   Glucose, UA NEGATIVE  NEGATIVE mg/dL   Hgb urine dipstick SMALL (*) NEGATIVE   Bilirubin Urine NEGATIVE  NEGATIVE   Ketones, ur NEGATIVE  NEGATIVE mg/dL   Protein, ur NEGATIVE  NEGATIVE mg/dL   Urobilinogen, UA 0.2  0.0 - 1.0 mg/dL   Nitrite NEGATIVE  NEGATIVE   Leukocytes, UA NEGATIVE  NEGATIVE  URINE MICROSCOPIC-ADD ON     Status: None   Collection Time    01/25/14  8:15 PM      Result Value Range   Squamous Epithelial / LPF RARE  RARE   WBC, UA 0-2  <3 WBC/hpf   RBC / HPF 0-2  <3 RBC/hpf   Bacteria, UA RARE  RARE  INFLUENZA PANEL BY PCR (TYPE A & B, H1N1)     Status: Abnormal   Collection Time    01/25/14  9:26 PM      Result Value Range   Influenza A By PCR POSITIVE (*) NEGATIVE   Influenza B By PCR NEGATIVE  NEGATIVE   H1N1 flu by pcr NOT DETECTED  NOT DETECTED   Comment:             The Xpert Flu assay (FDA approved  for     nasal aspirates or washes and     nasopharyngeal swab specimens), is     intended as an aid in the diagnosis of     influenza and should not be used as     a sole basis for treatment.   Dg Chest 2 View  01/25/2014   CLINICAL DATA:  Fever.  EXAM: CHEST  2 VIEW  COMPARISON:  08/02/2013  FINDINGS: Chronic cardiomegaly. Mitral annular calcification. No edema. No effusion (right lateral costophrenic sulcus blunting is likely from underpenetration when correlated with the lateral view). No definitive consolidation, with increased markings at the right base likely mild atelectasis.  IMPRESSION: No definitive pneumonia.   Electronically Signed   By: Jorje Guild M.D.   On: 01/25/2014 21:00    ROS  Blood pressure 126/45, pulse 84, temperature 98.9 F (37.2 C), temperature source Oral, resp. rate 18, height 5' 7.2" (1.707 m), weight 70.2 kg (154 lb 12.2 oz), SpO2 100.00%. Physical Exam   Assessment/Plan Pt arrived to floor and oriented to room and staff. Call bell place within reached. Bed place on its lowest position. VS taken. Will continue to monitor.   Mina Marble 01/26/2014, 12:27 AM

## 2014-01-26 NOTE — Progress Notes (Signed)
Drug-Drug Interaction Report  Tricyclic Antidepressants / Quinolones  Significance: Major  Warning: The rare occurrence of arrhythmias resulting from the potential for additive QT prolongation should be considered as a possibility when levofloxacin and nortriptyline are coadministered. Clinical impact is not known.  Onset: Rapid Document Level: Possible  Interacting Medications/Orders:  Tricyclic Antidepressants Oral or Non-Oral, Systemic Quinolones Oral or Non-Oral, Systemic  1. nortriptyline Order ([id number]): nortriptyline (PAMELOR) capsule 25 mg Route: Oral Start: 01/26/2014 0023 End: none Frequency: Daily at bedtime 1. levofloxacin Order ([id number]): levofloxacin (LEVAQUIN) tablet 500 mg Route: Oral Start: 01/26/2014 2200 End: none Frequency: Daily   Management Code: Professional review suggested  Effects: The rare occurrence of arrhythmias resulting from the potential for additive QT prolongation should be considered as a possibility when levofloxacin and nortriptyline are coadministered. Clinical impact is not known.  Mechanism: levofloxacin and nortriptyline may cause additive QT interval prolongation.  Management: The combination of levofloxacin and nortriptyline should be used with caution.   Recommendation:  1.  Consider alternative antibiotic (Ceftin 250 mg or Cefpodoxime 200 mg  twice daily) if you feel she is at risk for potential QT prolongation.  Rober Minion, PharmD., MS Clinical Pharmacist Pager:  [phone] Thank you for allowing pharmacy to be part of this patients care team.

## 2014-01-26 NOTE — Progress Notes (Signed)
Subjective: No complaints- feels better.  Discussed with husband who believes that confusion began prior to taking first dose of Tamiflu and became very difficult to manage last evening prompting them to bring her to the ER.  Objective: Vital signs in last 24 hours: Temp:  [98 F (36.7 C)-99.5 F (37.5 C)] 99.4 F (37.4 C) (02/03 0620) Pulse Rate:  [82-94] 84 (02/03 0620) Resp:  [16-20] 18 (02/03 0620) BP: (121-145)/(32-54) 141/50 mmHg (02/03 0620) SpO2:  [2 %-100 %] 92 % (02/03 0620) Weight:  [70.2 kg (154 lb 12.2 oz)] 70.2 kg (154 lb 12.2 oz) (02/03 0025) Weight change:  Last BM Date: 01/25/14  CBG (last 3)  No results found for this basename: GLUCAP,  in the last 72 hours  Intake/Output from previous day: 02/02 0701 - 02/03 0700 In: -  Out: 1300 [Urine:1300] Intake/Output this shift:    General appearance: fatigued, dry cough Eyes: no scleral icterus Throat: oropharynx moist without erythema Resp: right basilar crackles Cardio: regular rate and rhythm GI: soft, non-tender; bowel sounds normal; no masses,  no organomegaly Extremities: no clubbing, cyanosis or edema Neuro: appropriate, cooperative, mildly disoriented   Lab Results:  Recent Labs  01/25/14 1920 01/26/14 0511  NA 137 136*  K 5.2 4.1  CL 100 99  CO2 25 23  GLUCOSE 108* 84  BUN 12 8  CREATININE 0.57 0.54  CALCIUM 8.9 8.3*    Recent Labs  01/25/14 1920 01/26/14 0511  AST 28 18  ALT 13 12  ALKPHOS 58 50  BILITOT <0.2* <0.2*  PROT 6.4 5.8*  ALBUMIN 3.2* 2.9*    Recent Labs  01/25/14 1920 01/26/14 0511  WBC 4.2 4.3  NEUTROABS 2.8  --   HGB 11.6* 11.8*  HCT 35.3* 35.9*  MCV 96.2 96.8  PLT 106* 93*   Lab Results  Component Value Date   INR 1.01 08/02/2013   INR 1.2 07/22/2008   INR 1.0 06/06/2008   No results found for this basename: CKTOTAL, CKMB, CKMBINDEX, TROPONINI,  in the last 72 hours No results found for this basename: TSH, T4TOTAL, FREET3, T3FREE, THYROIDAB,  in the last  72 hours No results found for this basename: VITAMINB12, FOLATE, FERRITIN, TIBC, IRON, RETICCTPCT,  in the last 72 hours  Studies/Results: Dg Chest 2 View  01/25/2014   CLINICAL DATA:  Fever.  EXAM: CHEST  2 VIEW  COMPARISON:  08/02/2013  FINDINGS: Chronic cardiomegaly. Mitral annular calcification. No edema. No effusion (right lateral costophrenic sulcus blunting is likely from underpenetration when correlated with the lateral view). No definitive consolidation, with increased markings at the right base likely mild atelectasis.  IMPRESSION: No definitive pneumonia.   Electronically Signed   By: Jorje Guild M.D.   On: 01/25/2014 21:00     Medications: Scheduled: . amLODipine  5 mg Oral Daily  . aspirin EC  81 mg Oral Daily  . atenolol  25 mg Oral BID  . atorvastatin  20 mg Oral q1800  . calcium carbonate  1 tablet Oral Daily  . [START ON 01/28/2014] conjugated estrogens  1 Applicatorful Vaginal 2 times weekly  . divalproex  250 mg Oral BID  . donepezil  10 mg Oral Daily  . enoxaparin (LOVENOX) injection  40 mg Subcutaneous Q24H  . levofloxacin (LEVAQUIN) IV  750 mg Intravenous Q24H  . Memantine HCl ER  28 mg Oral Daily  . multivitamin with minerals  1 tablet Oral Daily  . nortriptyline  25 mg Oral QHS  . oseltamivir  30 mg Oral BID  . pantoprazole  40 mg Oral Daily  . predniSONE  20 mg Oral QAC breakfast  . pregabalin  50 mg Oral BID   Continuous: . sodium chloride 50 mL/hr at 01/26/14 0107    Assessment/Plan: 1. Influenza with respiratory manifestations- continue Tamiflu (renally dosed) due to history of chronic cough and tendency for reactive airways.  Continue Levaquin for possible superimposed bacterial infection- repeat CXR and consider discontinuation if no infiltrate.  2. Delirium superimposed on dementia- secondary to #1- preceded Tamfilu though tamiflu may cause mental status changes.  Monitor. 3. Hypertension- continue home medications 4. Alzheimer disease- continue  Aricept and Namenda.  Enrolled in AD study as well. 5. Fever- improving.  Secondary to #1. 6. Chronic kidney disease, stage III (moderate)- tamiflu is renally dosed due to GFR<60. 7. Disposition- anticipate discharge tomorrow if stable.   LOS: 1 day   Katharina Jehle,W [name] 01/26/2014, 7:31 AM

## 2014-01-27 MED ORDER — OSELTAMIVIR PHOSPHATE 30 MG PO CAPS: 30.0000 mg | ORAL_CAPSULE | Freq: Two times a day (BID) | ORAL | Status: DC

## 2014-01-27 NOTE — Discharge Summary (Signed)
DISCHARGE SUMMARY  [name]  MR#: [id number]  DOB:September 03, 1975  Date of Admission: 01/25/2014 Date of Discharge: 01/27/2014  Attending Physician:Abdulahi Schor,[name]  Patient's YQI:HKVQ,Darlene [name], MD  Consults:  None  Discharge Diagnoses: Principal Problem:   Influenza with respiratory manifestations Active Problems:   Delirium superimposed on dementia   Hypertension   Alzheimer disease   Fever   Chronic kidney disease, stage III (moderate)  Past Medical History  Diagnosis Date  . Bladder cancer     s/p cystectomy/ureterostomy  . Hypertension   . S/P appendectomy   . GERD (gastroesophageal reflux disease)   . Hyperlipidemia   . Dementia   . SVT (supraventricular tachycardia)   . Vitamin D deficiency   . Carotid stenosis   . Osteopenia   . Syncope   . Anxiety   . Cervical disc disease   . Pyelonephritis   . LBBB (left bundle branch block)   . Ventricular tachycardia   . CAD (coronary artery disease)   . PVC (premature ventricular contraction)   . Headache(784.0)   . Cervical spondylosis   . Heartburn   . Hydronephrosis   . Hypoglycemia   . Rectocele   . Microscopic hematuria   . Heart murmur   . Jejunostomy tube fell out   . Fall at home Nov. 9, 2014    Pt was picking up her dog and fell   Past Surgical History  Procedure Laterality Date  . Total abdominal hysterectomy Darlene/ bilateral salpingoophorectomy    . Hernia repair    . Cystectomy    . Ureterostomy    . Appendectom    . Cardiac catheterization    . Fracture surgery    . Abdominal hysterectomy       Discharge Medications:   Medication List         amLODipine 5 MG tablet  Commonly known as:  NORVASC  Take 5 mg by mouth daily.     aspirin EC 81 MG tablet  Take 81 mg by mouth daily.     atenolol 25 MG tablet  Commonly known as:  TENORMIN  Take 25 mg by mouth 2 (two) times daily.     CALTRATE 600 PO  Take 1 tablet by mouth daily.     divalproex 250 MG DR tablet  Commonly known as:   DEPAKOTE  Take 250 mg by mouth 2 (two) times daily.     donepezil 10 MG tablet  Commonly known as:  ARICEPT  Take 10 mg by mouth daily.     mometasone 220 MCG/INH inhaler  Commonly known as:  ASMANEX 60 METERED DOSES  Inhale 2 puffs into the lungs at bedtime.     multivitamin capsule  Take 1 capsule by mouth daily.     NAMENDA XR 28 MG Cp24  Generic drug:  Memantine HCl ER  Take 28 mg by mouth daily.     nortriptyline 25 MG capsule  Commonly known as:  PAMELOR  Take 25 mg by mouth at bedtime.     Omeprazole-Sodium Bicarbonate 20-1100 MG Caps capsule  Commonly known as:  ZEGERID  Take 1 capsule by mouth 2 (two) times daily.     oseltamivir 30 MG capsule  Commonly known as:  TAMIFLU  Take 1 capsule (30 mg total) by mouth 2 (two) times daily.     pregabalin 50 MG capsule  Commonly known as:  LYRICA  Take 50 mg by mouth 2 (two) times daily.     PREMARIN vaginal cream  Generic drug:  conjugated estrogens  Place 1 Applicatorful vaginally 2 (two) times a week.     rosuvastatin 10 MG tablet  Commonly known as:  CRESTOR  Take 10 mg by mouth every Monday, Wednesday, and Friday.     STOOL SOFTENER PO  Take 1 capsule by mouth daily as needed (constipation).     traMADol 50 MG tablet  Commonly known as:  ULTRAM  Take 25 mg by mouth every 12 (twelve) hours as needed for moderate pain.     zolpidem 12.5 MG CR tablet  Commonly known as:  AMBIEN CR  Take 12.5 mg by mouth at bedtime.        Hospital Procedures: Dg Chest 2 View  01/26/2014   CLINICAL DATA:  Influenza.  Cough and congestion.  EXAM: CHEST  2 VIEW  COMPARISON:  DG CHEST 2 VIEW dated 01/25/2014  FINDINGS: Two views of the chest were obtained. Both lungs are clear. Heart and mediastinum are within normal limits. Trachea is midline. Bony thorax is intact.  IMPRESSION: No acute cardiopulmonary disease.   Electronically Signed   By: Markus Daft M.D.   On: 01/26/2014 09:01   Dg Chest 2 View  01/25/2014   CLINICAL DATA:   Fever.  EXAM: CHEST  2 VIEW  COMPARISON:  08/02/2013  FINDINGS: Chronic cardiomegaly. Mitral annular calcification. No edema. No effusion (right lateral costophrenic sulcus blunting is likely from underpenetration when correlated with the lateral view). No definitive consolidation, with increased markings at the right base likely mild atelectasis.  IMPRESSION: No definitive pneumonia.   Electronically Signed   By: Jorje Guild M.D.   On: 01/25/2014 21:00    History of Present Illness (from Dr. Danna Hefty H&P): 80 year old Caucasian femalele who lives at home with the assistance of caregivers and her husband, with baseline dementia, crying more assistance in the evening hours, disoriented at baseline but able to dress herself, and answer questions appropriately during the day based on caregivers reports, was somewhat of a chronic cough, followed by pulmonology, treated with prednisone in the last several weeks if not months, complicated by history of hypertension GERD dementia CAD and remote bladder cancer with urostomy in place. Cough has been worse over the last one-2 days with fever up to 102F yesterday, family called memory care physician's office, Tamiflu prescribed, she did get one dose, 8 a full lunch, but became increasingly confused with persistent cough somewhat productive. Patient brought to the emergency room for evaluation. In emergency room chest x-ray unremarkable except for questionable early pneumonia versus atelectasis, patient remains hemodynamically stable but very confused compared with baseline, patient continued on Tamiflu, Levaquin initiated an IV fluids administrated, I was called for admission and further management. Please note patient had relative hypoxia with an O2 sat of 92-93% on room air.   Hospital Course: [name] was admitted to a medical bed. She was placed on renally dosed Tamiflu (30 mg twice a day) for influenza. Her influenza nasal swab was positive. She was also  started empirically on Levaquin to cover superimposed bacterial process. Repeat chest x-ray showed no evidence of pneumonia so this will be discontinued. With resolution of her fever, her acute delirium has improved in her mental status is now approaching her baseline dementia. Historically, it sounds like her confusion preceded the Tamiflu so this will be continued, though it is possible that it may cause mental status changes. At this point, the patient is stable for discharge home with her husband and 24-hour caregiver to  complete her treatment.  Day of Discharge Exam BP 127/75  Pulse 75  Temp(Src) 97.7 F (36.5 C) (Oral)  Resp 20  Ht 5' 7.2" (1.707 m)  Wt 70.2 kg (154 lb 12.2 oz)  BMI 24.09 kg/m2  SpO2 95%  Physical Exam: General appearance: alert and no distress Eyes: no scleral icterus Throat: oropharynx moist without erythema Resp: clear to auscultation bilaterally Cardio: regular rate and rhythm GI: soft, non-tender; bowel sounds normal; no masses,  no organomegaly Extremities: no clubbing, cyanosis or edema Neurologic: disorientation, cooperative  Discharge Labs:  Recent Labs  01/25/14 1920 01/26/14 0511  NA 137 136*  K 5.2 4.1  CL 100 99  CO2 25 23  GLUCOSE 108* 84  BUN 12 8  CREATININE 0.57 0.54  CALCIUM 8.9 8.3*    Recent Labs  01/25/14 1920 01/26/14 0511  AST 28 18  ALT 13 12  ALKPHOS 58 50  BILITOT <0.2* <0.2*  PROT 6.4 5.8*  ALBUMIN 3.2* 2.9*    Recent Labs  01/25/14 1920 01/26/14 0511  WBC 4.2 4.3  NEUTROABS 2.8  --   HGB 11.6* 11.8*  HCT 35.3* 35.9*  MCV 96.2 96.8  PLT 106* 93*   Lab Results  Component Value Date   INR 1.01 08/02/2013   INR 1.2 07/22/2008   INR 1.0 06/06/2008    Discharge instructions:     Discharge Orders   Future Appointments Provider Department Dept Phone   11/16/2014 1:30 PM [facility] [place] [address] [id number]   11/16/2014 2:40 PM Sharmon Leyden Nickel, NP [facility] [place] [phone]   Future Orders Complete By Expires   Diet general  As directed    Discharge instructions  As directed    Comments:     Call if increased shortness of breath, fever above 101.5, worsening cough, or worsening confusion   Increase activity slowly  As directed       Disposition: to home with husband and 24 hour caregiver  Follow-up Appts: Follow-up with Dr. Brigitte Pulse at [facility] in 1 week.  Condition on Discharge: stable  Tests Needing Follow-up: None  Signed: Barney Russomanno,[name] 01/27/2014, 6:33 AM

## 2014-01-27 NOTE — Progress Notes (Signed)
[name] to be D/C'd Home per MD order.  Discussed with the patient and all questions fully answered.    Medication List         amLODipine 5 MG tablet  Commonly known as:  NORVASC  Take 5 mg by mouth daily.     aspirin EC 81 MG tablet  Take 81 mg by mouth daily.     atenolol 25 MG tablet  Commonly known as:  TENORMIN  Take 25 mg by mouth 2 (two) times daily.     CALTRATE 600 PO  Take 1 tablet by mouth daily.     divalproex 250 MG DR tablet  Commonly known as:  DEPAKOTE  Take 250 mg by mouth 2 (two) times daily.     donepezil 10 MG tablet  Commonly known as:  ARICEPT  Take 10 mg by mouth daily.     mometasone 220 MCG/INH inhaler  Commonly known as:  ASMANEX 60 METERED DOSES  Inhale 2 puffs into the lungs at bedtime.     multivitamin capsule  Take 1 capsule by mouth daily.     NAMENDA XR 28 MG Cp24  Generic drug:  Memantine HCl ER  Take 28 mg by mouth daily.     nortriptyline 25 MG capsule  Commonly known as:  PAMELOR  Take 25 mg by mouth at bedtime.     Omeprazole-Sodium Bicarbonate 20-1100 MG Caps capsule  Commonly known as:  ZEGERID  Take 1 capsule by mouth 2 (two) times daily.     oseltamivir 30 MG capsule  Commonly known as:  TAMIFLU  Take 1 capsule (30 mg total) by mouth 2 (two) times daily.     pregabalin 50 MG capsule  Commonly known as:  LYRICA  Take 50 mg by mouth 2 (two) times daily.     PREMARIN vaginal cream  Generic drug:  conjugated estrogens  Place 1 Applicatorful vaginally 2 (two) times a week.     rosuvastatin 10 MG tablet  Commonly known as:  CRESTOR  Take 10 mg by mouth every Monday, Wednesday, and Friday.     STOOL SOFTENER PO  Take 1 capsule by mouth daily as needed (constipation).     traMADol 50 MG tablet  Commonly known as:  ULTRAM  Take 25 mg by mouth every 12 (twelve) hours as needed for moderate pain.     zolpidem 12.5 MG CR tablet  Commonly known as:  AMBIEN CR  Take 12.5 mg by mouth at bedtime.         VVS, Skin clean, dry and intact. IV catheter discontinued intact. Site without signs and symptoms of complications. Dressing and pressure applied.  An After Visit Summary was printed and given to the patient.  D/c education completed with patient/family including follow up instructions, medication list, d/c activities limitations if indicated, with other d/c instructions as indicated by MD - patient able to verbalize understanding, all questions fully answered.   Patient instructed to return to ED, call 911, or call MD for any changes in condition.   Patient escorted via [place], and D/C home via private auto.  Delman Cheadle 01/27/2014 12:49 PM

## 2014-01-27 NOTE — Discharge Instructions (Signed)

## 2014-01-27 NOTE — Care Management Note (Signed)
    Page 1 of 1   01/27/2014     2:00:47 PM   CARE MANAGEMENT NOTE 01/27/2014  Patient:  [name], [name]   Account Number:  [id number]  Date Initiated:  01/27/2014  Documentation initiated by:  Tomi Bamberger  Subjective/Objective Assessment:   dx fever  admit- lives with spouse and caregiver.     Action/Plan:   Anticipated DC Date:  01/27/2014   Anticipated DC Plan:  [place]  CM consult      Choice offered to / List presented to:             Status of service:  Completed, signed off Medicare Important Message given?   (If response is "NO", the following Medicare IM given date fields will be blank) Date Medicare IM given:   Date Additional Medicare IM given:    Discharge Disposition:  HOME/SELF CARE  Per UR Regulation:  Reviewed for med. necessity/level of care/duration of stay  If discussed at [place] of Stay Meetings, dates discussed:    Comments:

## 2014-02-11 ENCOUNTER — Encounter (INDEPENDENT_AMBULATORY_CARE_PROVIDER_SITE_OTHER): Payer: Self-pay

## 2014-02-11 DIAGNOSIS — Z0289 Encounter for other administrative examinations: Secondary | ICD-10-CM

## 2014-03-12 ENCOUNTER — Encounter (INDEPENDENT_AMBULATORY_CARE_PROVIDER_SITE_OTHER): Payer: Self-pay

## 2014-03-12 DIAGNOSIS — Z0289 Encounter for other administrative examinations: Secondary | ICD-10-CM

## 2014-04-16 ENCOUNTER — Ambulatory Visit (INDEPENDENT_AMBULATORY_CARE_PROVIDER_SITE_OTHER): Payer: Medicare Other | Admitting: Pulmonary Disease

## 2014-04-16 ENCOUNTER — Telehealth: Payer: Self-pay | Admitting: Pulmonary Disease

## 2014-04-16 ENCOUNTER — Encounter: Payer: Self-pay | Admitting: Pulmonary Disease

## 2014-04-16 VITALS — BP 110/60 | HR 67 | Temp 97.8°F | Ht 66.5 in | Wt 155.8 lb

## 2014-04-16 DIAGNOSIS — R053 Chronic cough: Secondary | ICD-10-CM

## 2014-04-16 DIAGNOSIS — R05 Cough: Secondary | ICD-10-CM

## 2014-04-16 MED ORDER — PREDNISONE 10 MG PO TABS: ORAL_TABLET | ORAL | Status: DC

## 2014-04-16 NOTE — Telephone Encounter (Signed)
Called [facility]. Was advised they did receive RX and is now ready for pick up. Called spouse and made aware

## 2014-04-16 NOTE — Assessment & Plan Note (Signed)
The patient is having escalation of her chronic cough over the last month, and the history continues to be most consistent with the irritable larynx syndrome.  She has always responded to a course of prednisone, and I suspect this is related to its effect on her postnasal drip and also helping laryngeal irritation. I've asked her to continue on her antihistamine and proton pump inhibitor, and I still think she needs to have an upper airway exam by otolaryngology to rule out structural lesions. I suspect that her underlying dementia is contributing to this process as well.

## 2014-04-16 NOTE — Patient Instructions (Signed)
Will treat with a course of prednisone for your allergy symptoms and inflammation in your throat that is causing your cough You can take the tramadol every 6 hrs for cough as well if having a hard time. Please call if you are not getting better after the prednisone Continue to try and avoid throat clearing if possible, and stay on antihistamine and reflux medication Will refer to ENT again to examine upper airway to make sure there is nothing causing your cough.

## 2014-04-16 NOTE — Progress Notes (Signed)
   Subjective:    Patient ID: [name], female    DOB: 1926-11-29, 80 y.o.   MRN: [id number]  HPI She comes in today for an acute sick visit. She has known chronic cough felt secondary to the irritable larynx syndrome, and complicated by postnasal drip and reflux disease. I've also felt this has gotten worse since her dementia has progressed. Her husband states that she has had progressive cough that is very disabling to the patient. He continues to be dry, and he states that she is clearing her throat quite a bit as well. She has had increased sneezing despite being on an antihistamine. She was recently in the hospital with influenza, and did not have pneumonia on her chest x-ray.   Review of Systems  Constitutional: Negative for fever and unexpected weight change.  HENT: Positive for postnasal drip. Negative for congestion, dental problem, ear pain, nosebleeds, rhinorrhea, sinus pressure, sneezing, sore throat and trouble swallowing.   Eyes: Negative for redness and itching.  Respiratory: Positive for cough. Negative for chest tightness, shortness of breath and wheezing.   Cardiovascular: Negative for palpitations and leg swelling.  Gastrointestinal: Negative for nausea and vomiting.  Genitourinary: Negative for dysuria.  Musculoskeletal: Negative for joint swelling.  Skin: Negative for rash.  Neurological: Negative for headaches.  Hematological: Does not bruise/bleed easily.  Psychiatric/Behavioral: Negative for dysphoric mood. The patient is not nervous/anxious.        Objective:   Physical Exam Thin female in no acute distress Nose without purulence or discharge noted Neck without lymphadenopathy or thyromegaly Chest totally clear to auscultation, no wheezing Heart exam with regular rate and rhythm Lower extremities with minimal ankle edema, no cyanosis Alert and oriented, moves all 4 extremities       Assessment & Plan:

## 2014-04-16 NOTE — Telephone Encounter (Signed)
Spoke with spouse. appt scheduled to come in and see [facility] this AM

## 2014-04-19 ENCOUNTER — Encounter (INDEPENDENT_AMBULATORY_CARE_PROVIDER_SITE_OTHER): Payer: Self-pay

## 2014-04-19 DIAGNOSIS — Z0289 Encounter for other administrative examinations: Secondary | ICD-10-CM

## 2014-05-07 ENCOUNTER — Other Ambulatory Visit (HOSPITAL_COMMUNITY): Payer: Self-pay | Admitting: Internal Medicine

## 2014-05-12 ENCOUNTER — Encounter (HOSPITAL_COMMUNITY): Payer: Self-pay

## 2014-05-12 ENCOUNTER — Ambulatory Visit (HOSPITAL_COMMUNITY)
Admission: RE | Admit: 2014-05-12 | Discharge: 2014-05-12 | Disposition: A | Discharge status: Home / Self Care | Payer: Medicare Other | Source: Ambulatory Visit | Attending: Internal Medicine | Admitting: Internal Medicine

## 2014-05-12 DIAGNOSIS — M810 Age-related osteoporosis without current pathological fracture: Secondary | ICD-10-CM | POA: Insufficient documentation

## 2014-05-12 MED ORDER — ZOLEDRONIC ACID 5 MG/100ML IV SOLN
5.0000 mg | Freq: Once | INTRAVENOUS | Status: AC
  Administered 2014-05-12: 5 mg via INTRAVENOUS
  Filled 2014-05-12: qty 100

## 2014-05-12 MED ORDER — SODIUM CHLORIDE 0.9 % IV SOLN
INTRAVENOUS | Status: AC
  Administered 2014-05-12: 14:00:00 via INTRAVENOUS

## 2014-05-12 NOTE — Discharge Instructions (Signed)
Drink  Fluids/water as tolerated over the next 72 hours °Tylenol or ibuprofen OTC as directed °Continue Calcium and Vit D as directed by your MD ° °Zoledronic Acid injection (Paget's Disease, Osteoporosis) °What is this medicine? °ZOLEDRONIC ACID (ZOE le dron ik AS id) lowers the amount of calcium loss from bone. It is used to treat Paget's disease and osteoporosis in women. °This medicine may be used for other purposes; ask your health care provider or pharmacist if you have questions. °COMMON BRAND NAME(S): Reclast, Zometa °What should I tell my health care provider before I take this medicine? °They need to know if you have any of these conditions: °-aspirin-sensitive asthma °-cancer, especially if you are receiving medicines used to treat cancer °-dental disease or wear dentures °-infection °-kidney disease °-low levels of calcium in the blood °-past surgery on the parathyroid gland or intestines °-receiving corticosteroids like dexamethasone or prednisone °-an unusual or allergic reaction to zoledronic acid, other medicines, foods, dyes, or preservatives °-pregnant or trying to get pregnant °-breast-feeding °How should I use this medicine? °This medicine is for infusion into a vein. It is given by a health care professional in a hospital or clinic setting. °Talk to your pediatrician regarding the use of this medicine in children. This medicine is not approved for use in children. °Overdosage: If you think you have taken too much of this medicine contact a poison control center or emergency room at once. °NOTE: This medicine is only for you. Do not share this medicine with others. °What if I miss a dose? °It is important not to miss your dose. Call your doctor or health care professional if you are unable to keep an appointment. °What may interact with this medicine? °-certain antibiotics given by injection °-NSAIDs, medicines for pain and inflammation, like ibuprofen or naproxen °-some diuretics like  bumetanide, furosemide °-teriparatide °This list may not describe all possible interactions. Give your health care provider a list of all the medicines, herbs, non-prescription drugs, or dietary supplements you use. Also tell them if you smoke, drink alcohol, or use illegal drugs. Some items may interact with your medicine. °What should I watch for while using this medicine? °Visit your doctor or health care professional for regular checkups. It may be some time before you see the benefit from this medicine. Do not stop taking your medicine unless your doctor tells you to. Your doctor may order blood tests or other tests to see how you are doing. °Women should inform their doctor if they wish to become pregnant or think they might be pregnant. There is a potential for serious side effects to an unborn child. Talk to your health care professional or pharmacist for more information. °You should make sure that you get enough calcium and vitamin D while you are taking this medicine. Discuss the foods you eat and the vitamins you take with your health care professional. °Some people who take this medicine have severe bone, joint, and/or muscle pain. This medicine may also increase your risk for jaw problems or a broken thigh bone. Tell your doctor right away if you have severe pain in your jaw, bones, joints, or muscles. Tell your doctor if you have any pain that does not go away or that gets worse. °Tell your dentist and dental surgeon that you are taking this medicine. You should not have major dental surgery while on this medicine. See your dentist to have a dental exam and fix any dental problems before starting this medicine. Take good care   of your teeth while on this medicine. Make sure you see your dentist for regular follow-up appointments. °What side effects may I notice from receiving this medicine? °Side effects that you should report to your doctor or health care professional as soon as possible: °-allergic  reactions like skin rash, itching or hives, swelling of the face, lips, or tongue °-anxiety, confusion, or depression °-breathing problems °-changes in vision °-eye pain °-feeling faint or lightheaded, falls °-jaw pain, especially after dental work °-mouth sores °-muscle cramps, stiffness, or weakness °-trouble passing urine or change in the amount of urine °Side effects that usually do not require medical attention (report to your doctor or health care professional if they continue or are bothersome): °-bone, joint, or muscle pain °-constipation °-diarrhea °-fever °-hair loss °-irritation at site where injected °-loss of appetite °-nausea, vomiting °-stomach upset °-trouble sleeping °-trouble swallowing °-weak or tired °This list may not describe all possible side effects. Call your doctor for medical advice about side effects. You may report side effects to FDA at 1-800-FDA-1088. °Where should I keep my medicine? °This drug is given in a hospital or clinic and will not be stored at home. °NOTE: This sheet is a summary. It may not cover all possible information. If you have questions about this medicine, talk to your doctor, pharmacist, or health care provider. °© 2014, [employer]. (2013-05-25 10:03:48) ° °

## 2014-05-12 NOTE — Progress Notes (Signed)
Tolerated Reclast infusion well. No signs of adverse reaction noted. Instructed to call Dr. Raul Del office for problems/concerns following discharge from Short Stay. Patient and patient's husband verbalize understanding.

## 2014-05-21 ENCOUNTER — Encounter (INDEPENDENT_AMBULATORY_CARE_PROVIDER_SITE_OTHER): Payer: Self-pay

## 2014-05-21 DIAGNOSIS — Z0289 Encounter for other administrative examinations: Secondary | ICD-10-CM

## 2014-05-31 ENCOUNTER — Ambulatory Visit: Payer: Medicare Other | Attending: Internal Medicine | Admitting: Physical Therapy

## 2014-05-31 DIAGNOSIS — IMO0001 Reserved for inherently not codable concepts without codable children: Secondary | ICD-10-CM | POA: Insufficient documentation

## 2014-05-31 DIAGNOSIS — M545 Low back pain, unspecified: Secondary | ICD-10-CM | POA: Insufficient documentation

## 2014-05-31 DIAGNOSIS — R269 Unspecified abnormalities of gait and mobility: Secondary | ICD-10-CM | POA: Insufficient documentation

## 2014-05-31 DIAGNOSIS — W19XXXS Unspecified fall, sequela: Secondary | ICD-10-CM | POA: Diagnosis not present

## 2014-06-03 ENCOUNTER — Encounter: Payer: Self-pay | Admitting: Pulmonary Disease

## 2014-06-03 ENCOUNTER — Other Ambulatory Visit: Payer: Self-pay | Admitting: Dermatology

## 2014-06-17 ENCOUNTER — Encounter (INDEPENDENT_AMBULATORY_CARE_PROVIDER_SITE_OTHER): Payer: Self-pay

## 2014-06-17 DIAGNOSIS — Z0289 Encounter for other administrative examinations: Secondary | ICD-10-CM

## 2014-07-13 ENCOUNTER — Encounter (INDEPENDENT_AMBULATORY_CARE_PROVIDER_SITE_OTHER): Payer: Self-pay

## 2014-07-13 DIAGNOSIS — Z0289 Encounter for other administrative examinations: Secondary | ICD-10-CM

## 2014-08-10 ENCOUNTER — Encounter (INDEPENDENT_AMBULATORY_CARE_PROVIDER_SITE_OTHER): Payer: Self-pay

## 2014-08-10 DIAGNOSIS — Z0289 Encounter for other administrative examinations: Secondary | ICD-10-CM

## 2014-08-11 ENCOUNTER — Encounter (INDEPENDENT_AMBULATORY_CARE_PROVIDER_SITE_OTHER): Payer: Self-pay

## 2014-08-11 DIAGNOSIS — Z0289 Encounter for other administrative examinations: Secondary | ICD-10-CM

## 2014-09-15 ENCOUNTER — Encounter (INDEPENDENT_AMBULATORY_CARE_PROVIDER_SITE_OTHER): Payer: Self-pay

## 2014-09-15 DIAGNOSIS — Z0289 Encounter for other administrative examinations: Secondary | ICD-10-CM

## 2014-10-19 ENCOUNTER — Encounter (INDEPENDENT_AMBULATORY_CARE_PROVIDER_SITE_OTHER): Payer: Self-pay

## 2014-10-19 DIAGNOSIS — Z0289 Encounter for other administrative examinations: Secondary | ICD-10-CM

## 2014-10-21 ENCOUNTER — Telehealth: Payer: Self-pay | Admitting: Neurology

## 2014-10-21 NOTE — Telephone Encounter (Signed)
I spoke to the patient's husband--Mr. Metzer--about rescheduling the patient's appointment from 11/15/2014 to 11/10/2014 at 14:00h to comply with the window visit.

## 2014-11-10 ENCOUNTER — Encounter (INDEPENDENT_AMBULATORY_CARE_PROVIDER_SITE_OTHER): Payer: Self-pay

## 2014-11-10 DIAGNOSIS — Z0289 Encounter for other administrative examinations: Secondary | ICD-10-CM

## 2014-11-12 ENCOUNTER — Telehealth: Payer: Self-pay | Admitting: Neurology

## 2014-11-12 NOTE — Telephone Encounter (Signed)
Patient's husband wanted to know if patient could come this afternoon for the [facility]. I informed him that office will be closed by 12pm. Therefore, patient will be coming on [id number] for her next appointment instead.

## 2014-11-15 ENCOUNTER — Encounter: Payer: Self-pay | Admitting: Family

## 2014-11-16 ENCOUNTER — Ambulatory Visit: Payer: Self-pay | Admitting: Family

## 2014-11-16 ENCOUNTER — Other Ambulatory Visit (HOSPITAL_COMMUNITY): Payer: Self-pay

## 2014-11-30 ENCOUNTER — Encounter (INDEPENDENT_AMBULATORY_CARE_PROVIDER_SITE_OTHER): Payer: Self-pay

## 2014-11-30 DIAGNOSIS — Z0289 Encounter for other administrative examinations: Secondary | ICD-10-CM

## 2014-12-02 ENCOUNTER — Encounter: Payer: Self-pay | Admitting: Family

## 2014-12-03 ENCOUNTER — Ambulatory Visit (HOSPITAL_COMMUNITY)
Admission: RE | Admit: 2014-12-03 | Discharge: 2014-12-03 | Disposition: A | Discharge status: Home / Self Care | Payer: Medicare Other | Source: Ambulatory Visit | Attending: Family | Admitting: Family

## 2014-12-03 ENCOUNTER — Ambulatory Visit (INDEPENDENT_AMBULATORY_CARE_PROVIDER_SITE_OTHER): Payer: Medicare Other | Admitting: Family

## 2014-12-03 ENCOUNTER — Encounter: Payer: Self-pay | Admitting: Family

## 2014-12-03 VITALS — BP 120/56 | HR 73 | Resp 16 | Ht 65.5 in | Wt 150.0 lb

## 2014-12-03 DIAGNOSIS — I6529 Occlusion and stenosis of unspecified carotid artery: Secondary | ICD-10-CM | POA: Diagnosis not present

## 2014-12-03 DIAGNOSIS — I6523 Occlusion and stenosis of bilateral carotid arteries: Secondary | ICD-10-CM

## 2014-12-03 NOTE — Patient Instructions (Signed)
Stroke Prevention Some medical conditions and behaviors are associated with an increased chance of having a stroke. You may prevent a stroke by making healthy choices and managing medical conditions. HOW CAN I REDUCE MY RISK OF HAVING A STROKE?   Stay physically active. Get at least 30 minutes of activity on most or all days.  Do not smoke. It may also be helpful to avoid exposure to secondhand smoke.  Limit alcohol use. Moderate alcohol use is considered to be:  No more than 2 drinks per day for men.  No more than 1 drink per day for nonpregnant women.  Eat healthy foods. This involves:  Eating 5 or more servings of fruits and vegetables a day.  Making dietary changes that address high blood pressure (hypertension), high cholesterol, diabetes, or obesity.  Manage your cholesterol levels.  Making food choices that are high in fiber and low in saturated fat, trans fat, and cholesterol may control cholesterol levels.  Take any prescribed medicines to control cholesterol as directed by your health care provider.  Manage your diabetes.  Controlling your carbohydrate and sugar intake is recommended to manage diabetes.  Take any prescribed medicines to control diabetes as directed by your health care provider.  Control your hypertension.  Making food choices that are low in salt (sodium), saturated fat, trans fat, and cholesterol is recommended to manage hypertension.  Take any prescribed medicines to control hypertension as directed by your health care provider.  Maintain a healthy weight.  Reducing calorie intake and making food choices that are low in sodium, saturated fat, trans fat, and cholesterol are recommended to manage weight.  Stop drug abuse.  Avoid taking birth control pills.  Talk to your health care provider about the risks of taking birth control pills if you are over 35 years old, smoke, get migraines, or have ever had a blood clot.  Get evaluated for sleep  disorders (sleep apnea).  Talk to your health care provider about getting a sleep evaluation if you snore a lot or have excessive sleepiness.  Take medicines only as directed by your health care provider.  For some people, aspirin or blood thinners (anticoagulants) are helpful in reducing the risk of forming abnormal blood clots that can lead to stroke. If you have the irregular heart rhythm of atrial fibrillation, you should be on a blood thinner unless there is a good reason you cannot take them.  Understand all your medicine instructions.  Make sure that other conditions (such as anemia or atherosclerosis) are addressed. SEEK IMMEDIATE MEDICAL CARE IF:   You have sudden weakness or numbness of the face, arm, or leg, especially on one side of the body.  Your face or eyelid droops to one side.  You have sudden confusion.  You have trouble speaking (aphasia) or understanding.  You have sudden trouble seeing in one or both eyes.  You have sudden trouble walking.  You have dizziness.  You have a loss of balance or coordination.  You have a sudden, severe headache with no known cause.  You have new chest pain or an irregular heartbeat. Any of these symptoms may represent a serious problem that is an emergency. Do not wait to see if the symptoms will go away. Get medical help at once. Call your local emergency services (911 in [place] Do not drive yourself to the hospital. Document Released: 01/17/2005 Document Revised: 04/26/2014 Document Reviewed: 06/12/2013 [employer] Patient Information 2015 [employer]. This information is not intended to replace advice given   to you by your health care provider. Make sure you discuss any questions you have with your health care provider.  

## 2014-12-03 NOTE — Progress Notes (Signed)
Established Carotid Patient   History of Present Illness  [name] is a 80 y.o. female patient whom Dr. Donnetta Hutching has been following for carotid artery surveillance, she returns today for same.  Patient has not had previous vascular intervention. The patient's caregiver states that pt has Alzheimer's Disease.  Neither patient nor husband recalls why she was referred here, but review of records indicates that Dr. Brigitte Pulse referred patient in 2010 for evaluation and continued monitoring of 20-39% right ICA stenosis and 40-59% left ICA stenosis.  Patient has Negative history of TIA or stroke symptom. The patient denies amaurosis fugax or monocular blindness. The patient denies facial drooping. Pt. denies hemiplegia. The patient denies receptive or expressive aphasia. Pt. denies extremity weakness.  Patient denies claudication symptoms, denies non-healing wounds.  Patient denies New Medical or Surgical History. She fell about a week ago, has facial bruising, states she was not evaluated medically, her husband and caretaker with pt denies that pt had LOC. Husband states after she fell about six months ago she was medically evaluated.  Husband has just hired someone to spend the night to watch pt.  Pt Diabetic: No Pt smoker: former smoker, quit 60 years ago  Pt meds include: Statin : Yes ASA: Yes Other anticoagulants/antiplatelets: no  Past Medical History  Diagnosis Date  . Bladder cancer     s/p cystectomy/ureterostomy  . Hypertension   . S/P appendectomy   . GERD (gastroesophageal reflux disease)   . Hyperlipidemia   . Dementia   . SVT (supraventricular tachycardia)   . Vitamin D deficiency   . Carotid stenosis   . Osteopenia   . Syncope   . Anxiety   . Cervical disc disease   . Pyelonephritis   . LBBB (left bundle branch block)   . Ventricular tachycardia   . CAD (coronary artery disease)   . PVC (premature ventricular contraction)   . Headache(784.0)   .  Cervical spondylosis   . Heartburn   . Hydronephrosis   . Hypoglycemia   . Rectocele   . Microscopic hematuria   . Heart murmur   . Jejunostomy tube fell out   . Fall at home Nov. 9, 2014 and Dec. 2015    Pt was picking up her dog and fell and at home    Social History History  Substance Use Topics  . Smoking status: Former Smoker -- 0.10 packs/day for 4 years    Types: Cigarettes    Quit date: 12/24/1949  . Smokeless tobacco: Never Used  . Alcohol Use: No    Family History Family History  Problem Relation Age of Onset  . Lung cancer Father   . Cancer Father   . Heart attack Father   . Heart disease Mother     Heart Disease before age 66  . Allergies Mother   . Hypertension Mother   . Heart attack Mother   . Heart disease Sister   . Allergies Sister     Surgical History Past Surgical History  Procedure Laterality Date  . Total abdominal hysterectomy w/ bilateral salpingoophorectomy    . Hernia repair    . Cystectomy    . Ureterostomy    . Appendectom    . Cardiac catheterization    . Fracture surgery    . Abdominal hysterectomy      Allergies  Allergen Reactions  . Bethanechol Chloride     Chest pains. Coronary spasms  . Celecoxib     Celebrex.  Severe itching.   Marland Kitchen  Citalopram Hydrobromide     Hyper, nervousness  . Clindamycin     Itching, coughing  . Codeine     tachycardia  . Desipramine     Severe tachycardia  . Epinephrine     Severe shortness of breath.   . Erythromycin     Burning, itching  . Floxin [Ocuflox]     itching  . Glucose     Sugar.  Weakness,faintness  . Hydromorphone Hcl     Dilaudid.  Comatose-Anaphylactic reaction.   . Ibuprofen     Tachycardia. weakness  . Ibuprofen     Tachycardia, weakness  . Iodine     Hives, edema, tachycardia  . Meperidine Hcl     Demerol.  Tachycardia, extreme nervousness.   . Morphine     Tachycardia, hallucinations, extreme nervousness.  . Paroxetine Hcl     Extreme nervousness  .  Penicillins     Severe edema, severe difficulty breathing, anaphylactic reaction.  . Procaine Hcl     Novocain. Severe shortness of breath.  . Propranolol Hcl     Inderal.  Extreme low blood pressure.   . Red Dye     Severe itching  . Restoril     Temazepam.  Extreme nervousness, sleeplessness  . Scopolamine     Irrational, loss of memory  . Sulfonamide Derivatives     Hives, edema  . Tetracycline     Severe hives, edema  . Trazodone And Nefazodone     Very hyper  . Valium     itching    Current Outpatient Prescriptions  Medication Sig Dispense Refill  . amLODipine (NORVASC) 5 MG tablet Take 5 mg by mouth daily.    Marland Kitchen aspirin EC 81 MG tablet Take 81 mg by mouth daily.    Marland Kitchen atenolol (TENORMIN) 25 MG tablet Take 25 mg by mouth 2 (two) times daily.    . Calcium Carbonate (CALTRATE 600 PO) Take 1 tablet by mouth daily.      . cetirizine (ZYRTEC) 10 MG tablet Take 10 mg by mouth daily.    . divalproex (DEPAKOTE) 250 MG DR tablet Take 250 mg by mouth 2 (two) times daily.    Mariane Baumgarten Calcium (STOOL SOFTENER PO) Take 1 capsule by mouth daily as needed (constipation).     Marland Kitchen donepezil (ARICEPT) 10 MG tablet Take 10 mg by mouth daily.      . Memantine HCl ER (NAMENDA XR) 28 MG CP24 Take 28 mg by mouth daily.    . Multiple Vitamin (MULTIVITAMIN) capsule Take 1 capsule by mouth daily.      . nortriptyline (PAMELOR) 25 MG capsule as needed.    Earney Navy Bicarbonate (ZEGERID) 20-1100 MG CAPS capsule Take 1 capsule by mouth 2 (two) times daily.    . predniSONE (DELTASONE) 10 MG tablet Take 4 tabs daily x 2 days, 3 tabs daily x 2, 2 tab x 2 days, then 1 tab x 2 days, then stop 20 tablet 0  . rosuvastatin (CRESTOR) 10 MG tablet Take 10 mg by mouth every Monday, Wednesday, and Friday.     . temazepam (RESTORIL) 15 MG capsule at bedtime as needed.     . traMADol (ULTRAM) 50 MG tablet Take 25 mg by mouth every 12 (twelve) hours as needed for moderate pain.    Marland Kitchen zolpidem (AMBIEN CR) 12.5  MG CR tablet Take 12.5 mg by mouth at bedtime.       No current facility-administered medications for this visit.  Review of Systems : See HPI for pertinent positives and negatives.  Physical Examination  Filed Vitals:   12/03/14 1618 12/03/14 1622  BP: 120/60 120/56  Pulse: 73 73  Resp:  16  Height:  5' 5.5" (1.664 m)  Weight:  150 lb (68.04 kg)  SpO2:  99%   Body mass index is 24.57 kg/(m^2).  General: WDWN female in NAD GAIT: normal Eyes: PERRLA Pulmonary: CTAB, Negative Rales, Negative rhonchi, & Negative wheezing.  Cardiac: regular Rhythm, positive murmur.  VASCULAR EXAM Carotid Bruits Left Right   Transmitted cardiac murmur Transmitted cardiac murmur   Aorta is not palpable. Radial pulses are 2+ palpable and equal.      LE Pulses LEFT RIGHT   POPLITEAL not palpable  not palpable   POSTERIOR TIBIAL not palpable  not palpable    DORSALIS PEDIS  ANTERIOR TIBIAL palpable  palpable     Gastrointestinal: soft, nontender, BS WNL, no r/g, no palpable masses.  Musculoskeletal: Negative muscle atrophy/wasting. M/S 5/5 throughout, Extremities without ischemic changes, facial bruising.  Neurologic: A&O X 1; Appropriate Affect, Speech is normal CN 2-12 intact, Pain and light touch intact in extremities, Motor exam as listed above.    Non-Invasive Vascular Imaging CAROTID DUPLEX 12/03/2014   CEREBROVASCULAR DUPLEX EVALUATION    INDICATION: Carotid disease    PREVIOUS INTERVENTION(S):     DUPLEX EXAM:     RIGHT  LEFT  Peak Systolic Velocities (cm/s) End Diastolic Velocities (cm/s) Plaque LOCATION Peak Systolic Velocities (cm/s) End Diastolic Velocities (cm/s) Plaque  64 9  CCA PROXIMAL 90 11   65 9  CCA MID 84 11   54 10 HT CCA DISTAL 72 11 CP  85 0 HT ECA 130 0 HT  96 21 HT ICA  PROXIMAL 176 20 CP  84 19  ICA MID 97 17   67 14  ICA DISTAL 79 19     1.8 ICA / CCA Ratio (PSV) 2.4  Antegrade Vertebral Flow Antegrade  109 Brachial Systolic Pressure (mmHg) 323  Multiphasic (subclavian artery) Brachial Artery Waveforms Multiphasic (subclavian artery)    Plaque Morphology:  HM = Homogeneous, HT = Heterogeneous, CP = Calcific Plaque, SP = Smooth Plaque, IP = Irregular Plaque     ADDITIONAL FINDINGS: . No significant stenosis of the bilateral external or common carotid arteries. . Mildly resistive flow noted in the bilateral carotid and vertebral arteries.    IMPRESSION: Doppler velocities, based on end-diastolic velocity criteria, suggest less than 40% stenoses of the bilateral proximal internal carotid arteries.    Compared to the previous exam:  No significant change noted when compared to the previous exam on 11/03/13.         Assessment: [name] is a 80 y.o. female who presents with asymptomatic bilateral minimal ICA stenosis, unchanged since 2012. Both ICA's have been <40% stenotic since 2012, no change since then.   Plan: Follow-up in 1 year with Carotid Duplex, will defer to Dr. Brigitte Pulse how long and whether pt  should continue yearly carotid Duplex, or decrease to every 2 years.    I discussed in depth with the patient the nature of atherosclerosis, and emphasized the importance of maximal medical management including strict control of blood pressure, blood glucose, and lipid levels, obtaining regular exercise, and continued cessation of smoking.  The patient is aware that without maximal medical management the underlying atherosclerotic disease process will progress, limiting the benefit of any interventions. The patient was given information about stroke prevention and  what symptoms should prompt the patient to seek immediate medical care. Thank you for allowing [place] to participate in this patient's care.  Clemon Chambers, RN, MSN, FNP-C [facility] Office: [phone]  Clinic Physician: Bridgett Larsson  12/03/2014 4:27 PM

## 2014-12-09 ENCOUNTER — Other Ambulatory Visit: Payer: Self-pay | Admitting: Dermatology

## 2014-12-27 ENCOUNTER — Encounter (INDEPENDENT_AMBULATORY_CARE_PROVIDER_SITE_OTHER): Payer: Medicare Other

## 2014-12-27 DIAGNOSIS — Z0289 Encounter for other administrative examinations: Secondary | ICD-10-CM

## 2015-01-13 ENCOUNTER — Inpatient Hospital Stay (HOSPITAL_COMMUNITY): Payer: Medicare Other

## 2015-01-13 ENCOUNTER — Inpatient Hospital Stay (HOSPITAL_COMMUNITY)
Admission: AD | Admit: 2015-01-13 | Discharge: 2015-01-18 | DRG: 194 | Disposition: A | Discharge status: Home Health | Payer: Medicare Other | Source: Ambulatory Visit | Attending: Internal Medicine | Admitting: Internal Medicine

## 2015-01-13 DIAGNOSIS — G308 Other Alzheimer's disease: Secondary | ICD-10-CM | POA: Diagnosis present

## 2015-01-13 DIAGNOSIS — I10 Essential (primary) hypertension: Secondary | ICD-10-CM | POA: Diagnosis present

## 2015-01-13 DIAGNOSIS — Z7982 Long term (current) use of aspirin: Secondary | ICD-10-CM

## 2015-01-13 DIAGNOSIS — J189 Pneumonia, unspecified organism: Secondary | ICD-10-CM | POA: Diagnosis present

## 2015-01-13 DIAGNOSIS — B9620 Unspecified Escherichia coli [E. coli] as the cause of diseases classified elsewhere: Secondary | ICD-10-CM | POA: Diagnosis present

## 2015-01-13 DIAGNOSIS — E871 Hypo-osmolality and hyponatremia: Secondary | ICD-10-CM | POA: Diagnosis present

## 2015-01-13 DIAGNOSIS — Z801 Family history of malignant neoplasm of trachea, bronchus and lung: Secondary | ICD-10-CM | POA: Diagnosis not present

## 2015-01-13 DIAGNOSIS — Z87891 Personal history of nicotine dependence: Secondary | ICD-10-CM

## 2015-01-13 DIAGNOSIS — I6523 Occlusion and stenosis of bilateral carotid arteries: Secondary | ICD-10-CM | POA: Diagnosis present

## 2015-01-13 DIAGNOSIS — E785 Hyperlipidemia, unspecified: Secondary | ICD-10-CM | POA: Diagnosis present

## 2015-01-13 DIAGNOSIS — N390 Urinary tract infection, site not specified: Secondary | ICD-10-CM | POA: Diagnosis present

## 2015-01-13 DIAGNOSIS — Z8551 Personal history of malignant neoplasm of bladder: Secondary | ICD-10-CM

## 2015-01-13 DIAGNOSIS — F0281 Dementia in other diseases classified elsewhere with behavioral disturbance: Secondary | ICD-10-CM | POA: Diagnosis present

## 2015-01-13 DIAGNOSIS — K5641 Fecal impaction: Secondary | ICD-10-CM | POA: Diagnosis present

## 2015-01-13 DIAGNOSIS — Z882 Allergy status to sulfonamides status: Secondary | ICD-10-CM | POA: Diagnosis not present

## 2015-01-13 DIAGNOSIS — M25511 Pain in right shoulder: Secondary | ICD-10-CM | POA: Diagnosis present

## 2015-01-13 DIAGNOSIS — M6281 Muscle weakness (generalized): Secondary | ICD-10-CM | POA: Diagnosis present

## 2015-01-13 DIAGNOSIS — M542 Cervicalgia: Secondary | ICD-10-CM | POA: Diagnosis present

## 2015-01-13 DIAGNOSIS — R296 Repeated falls: Secondary | ICD-10-CM | POA: Diagnosis present

## 2015-01-13 DIAGNOSIS — E860 Dehydration: Secondary | ICD-10-CM | POA: Diagnosis present

## 2015-01-13 DIAGNOSIS — Z8249 Family history of ischemic heart disease and other diseases of the circulatory system: Secondary | ICD-10-CM

## 2015-01-13 DIAGNOSIS — D649 Anemia, unspecified: Secondary | ICD-10-CM | POA: Diagnosis present

## 2015-01-13 DIAGNOSIS — Z936 Other artificial openings of urinary tract status: Secondary | ICD-10-CM

## 2015-01-13 DIAGNOSIS — M546 Pain in thoracic spine: Secondary | ICD-10-CM | POA: Diagnosis present

## 2015-01-13 DIAGNOSIS — R4182 Altered mental status, unspecified: Secondary | ICD-10-CM | POA: Diagnosis present

## 2015-01-13 DIAGNOSIS — M47812 Spondylosis without myelopathy or radiculopathy, cervical region: Secondary | ICD-10-CM | POA: Diagnosis present

## 2015-01-13 DIAGNOSIS — Z906 Acquired absence of other parts of urinary tract: Secondary | ICD-10-CM | POA: Diagnosis present

## 2015-01-13 DIAGNOSIS — M8580 Other specified disorders of bone density and structure, unspecified site: Secondary | ICD-10-CM | POA: Diagnosis present

## 2015-01-13 DIAGNOSIS — F05 Delirium due to known physiological condition: Secondary | ICD-10-CM | POA: Diagnosis present

## 2015-01-13 DIAGNOSIS — I2510 Atherosclerotic heart disease of native coronary artery without angina pectoris: Secondary | ICD-10-CM | POA: Diagnosis present

## 2015-01-13 DIAGNOSIS — M898X1 Other specified disorders of bone, shoulder: Secondary | ICD-10-CM | POA: Diagnosis present

## 2015-01-13 DIAGNOSIS — I639 Cerebral infarction, unspecified: Secondary | ICD-10-CM

## 2015-01-13 LAB — CBC WITH DIFFERENTIAL/PLATELET
Basophils Absolute: 0 10*3/uL (ref 0.0–0.1)
Basophils Relative: 0 % (ref 0–1)
EOS PCT: 0 % (ref 0–5)
Eosinophils Absolute: 0 10*3/uL (ref 0.0–0.7)
HCT: 35.3 % — ABNORMAL LOW (ref 36.0–46.0)
Hemoglobin: 11.4 g/dL — ABNORMAL LOW (ref 12.0–15.0)
LYMPHS ABS: 1.3 10*3/uL (ref 0.7–4.0)
Lymphocytes Relative: 14 % (ref 12–46)
MCH: 30.7 pg (ref 26.0–34.0)
MCHC: 32.3 g/dL (ref 30.0–36.0)
MCV: 95.1 fL (ref 78.0–100.0)
MONOS PCT: 12 % (ref 3–12)
Monocytes Absolute: 1.1 10*3/uL — ABNORMAL HIGH (ref 0.1–1.0)
Neutro Abs: 7.1 10*3/uL (ref 1.7–7.7)
Neutrophils Relative %: 74 % (ref 43–77)
Platelets: 209 10*3/uL (ref 150–400)
RBC: 3.71 MIL/uL — AB (ref 3.87–5.11)
RDW: 13.3 % (ref 11.5–15.5)
WBC: 9.6 10*3/uL (ref 4.0–10.5)

## 2015-01-13 LAB — COMPREHENSIVE METABOLIC PANEL
ALK PHOS: 65 U/L (ref 39–117)
ALT: 41 U/L — AB (ref 0–35)
ANION GAP: 7 (ref 5–15)
AST: 26 U/L (ref 0–37)
Albumin: 2.7 g/dL — ABNORMAL LOW (ref 3.5–5.2)
BUN: 23 mg/dL (ref 6–23)
CALCIUM: 9.4 mg/dL (ref 8.4–10.5)
CO2: 29 mmol/L (ref 19–32)
Chloride: 100 mEq/L (ref 96–112)
Creatinine, Ser: 0.65 mg/dL (ref 0.50–1.10)
GFR calc non Af Amer: 78 mL/min — ABNORMAL LOW (ref >90)
Glucose, Bld: 124 mg/dL — ABNORMAL HIGH (ref 70–99)
Potassium: 4.8 mmol/L (ref 3.5–5.1)
SODIUM: 136 mmol/L (ref 135–145)
TOTAL PROTEIN: 6.5 g/dL (ref 6.0–8.3)
Total Bilirubin: 0.5 mg/dL (ref 0.3–1.2)

## 2015-01-13 MED ORDER — ASPIRIN EC 81 MG PO TBEC
81.0000 mg | DELAYED_RELEASE_TABLET | Freq: Every day | ORAL | Status: DC
  Administered 2015-01-14 – 2015-01-18 (×5): 81 mg via ORAL
  Filled 2015-01-13 (×6): qty 1

## 2015-01-13 MED ORDER — TRAMADOL HCL 50 MG PO TABS
25.0000 mg | ORAL_TABLET | Freq: Two times a day (BID) | ORAL | Status: DC | PRN
  Administered 2015-01-15 – 2015-01-16 (×2): 25 mg via ORAL
  Filled 2015-01-13 (×2): qty 1

## 2015-01-13 MED ORDER — DIVALPROEX SODIUM 250 MG PO DR TAB
250.0000 mg | DELAYED_RELEASE_TABLET | Freq: Two times a day (BID) | ORAL | Status: DC
  Administered 2015-01-13 – 2015-01-18 (×11): 250 mg via ORAL
  Filled 2015-01-13 (×14): qty 1

## 2015-01-13 MED ORDER — ASPIRIN EC 81 MG PO TBEC
81.0000 mg | DELAYED_RELEASE_TABLET | Freq: Every day | ORAL | Status: DC
  Administered 2015-01-13: 81 mg via ORAL
  Filled 2015-01-13: qty 1

## 2015-01-13 MED ORDER — DONEPEZIL HCL 10 MG PO TABS
10.0000 mg | ORAL_TABLET | Freq: Every day | ORAL | Status: DC
  Administered 2015-01-13 – 2015-01-18 (×6): 10 mg via ORAL
  Filled 2015-01-13 (×7): qty 1

## 2015-01-13 MED ORDER — HYDROCODONE-ACETAMINOPHEN 5-325 MG PO TABS
1.0000 | ORAL_TABLET | ORAL | Status: DC | PRN
  Administered 2015-01-14 – 2015-01-16 (×3): 2 via ORAL
  Filled 2015-01-13 (×3): qty 2

## 2015-01-13 MED ORDER — MULTIVITAMINS PO CAPS
1.0000 | ORAL_CAPSULE | Freq: Every day | ORAL | Status: DC
Start: 2015-01-13 — End: 2015-01-13

## 2015-01-13 MED ORDER — POLYETHYLENE GLYCOL 3350 17 G PO PACK
17.0000 g | PACK | Freq: Every day | ORAL | Status: DC | PRN
Start: 2015-01-13 — End: 2015-01-18

## 2015-01-13 MED ORDER — ATENOLOL 25 MG PO TABS
25.0000 mg | ORAL_TABLET | Freq: Two times a day (BID) | ORAL | Status: DC
  Administered 2015-01-13 – 2015-01-18 (×11): 25 mg via ORAL
  Filled 2015-01-13 (×15): qty 1

## 2015-01-13 MED ORDER — TEMAZEPAM 7.5 MG PO CAPS: 7.5000 mg | ORAL_CAPSULE | Freq: Every evening | ORAL | Status: DC | PRN

## 2015-01-13 MED ORDER — ACETAMINOPHEN 325 MG PO TABS
650.0000 mg | ORAL_TABLET | Freq: Four times a day (QID) | ORAL | Status: DC | PRN
  Administered 2015-01-14: 650 mg via ORAL
  Filled 2015-01-13: qty 2

## 2015-01-13 MED ORDER — ONDANSETRON HCL 4 MG/2ML IJ SOLN: 4.0000 mg | Freq: Four times a day (QID) | INTRAMUSCULAR | Status: DC | PRN

## 2015-01-13 MED ORDER — FENTANYL CITRATE 0.05 MG/ML IJ SOLN: 12.5000 ug | INTRAMUSCULAR | Status: DC | PRN

## 2015-01-13 MED ORDER — CLOPIDOGREL BISULFATE 75 MG PO TABS
75.0000 mg | ORAL_TABLET | Freq: Every day | ORAL | Status: DC
  Administered 2015-01-13: 75 mg via ORAL
  Filled 2015-01-13: qty 1

## 2015-01-13 MED ORDER — NORTRIPTYLINE HCL 25 MG PO CAPS
25.0000 mg | ORAL_CAPSULE | Freq: Every day | ORAL | Status: DC
  Administered 2015-01-13 – 2015-01-17 (×5): 25 mg via ORAL
  Filled 2015-01-13 (×7): qty 1

## 2015-01-13 MED ORDER — ACETAMINOPHEN 650 MG RE SUPP: 650.0000 mg | Freq: Four times a day (QID) | RECTAL | Status: DC | PRN

## 2015-01-13 MED ORDER — ONDANSETRON HCL 4 MG PO TABS: 4.0000 mg | ORAL_TABLET | Freq: Four times a day (QID) | ORAL | Status: DC | PRN

## 2015-01-13 MED ORDER — MEMANTINE HCL ER 28 MG PO CP24
28.0000 mg | ORAL_CAPSULE | Freq: Every day | ORAL | Status: DC
  Administered 2015-01-13 – 2015-01-18 (×6): 28 mg via ORAL
  Filled 2015-01-13 (×8): qty 1

## 2015-01-13 MED ORDER — LEVOFLOXACIN IN D5W 750 MG/150ML IV SOLN
750.0000 mg | INTRAVENOUS | Status: DC
  Administered 2015-01-13 – 2015-01-14 (×2): 750 mg via INTRAVENOUS
  Filled 2015-01-13 (×2): qty 150

## 2015-01-13 MED ORDER — CALCIUM CARBONATE 1250 (500 CA) MG PO TABS
1.0000 | ORAL_TABLET | Freq: Every day | ORAL | Status: DC
  Administered 2015-01-14 – 2015-01-18 (×5): 500 mg via ORAL
  Filled 2015-01-13 (×10): qty 1

## 2015-01-13 MED ORDER — ENOXAPARIN SODIUM 40 MG/0.4ML ~~LOC~~ SOLN
40.0000 mg | SUBCUTANEOUS | Status: DC
  Administered 2015-01-13 – 2015-01-17 (×5): 40 mg via SUBCUTANEOUS
  Filled 2015-01-13 (×6): qty 0.4

## 2015-01-13 MED ORDER — AMLODIPINE BESYLATE 5 MG PO TABS
5.0000 mg | ORAL_TABLET | Freq: Every day | ORAL | Status: DC
  Administered 2015-01-13 – 2015-01-18 (×6): 5 mg via ORAL
  Filled 2015-01-13 (×7): qty 1

## 2015-01-13 MED ORDER — DOCUSATE SODIUM 100 MG PO CAPS
100.0000 mg | ORAL_CAPSULE | Freq: Every day | ORAL | Status: DC
  Administered 2015-01-13 – 2015-01-18 (×5): 100 mg via ORAL
  Filled 2015-01-13 (×4): qty 1

## 2015-01-13 MED ORDER — PANTOPRAZOLE SODIUM 40 MG PO TBEC
40.0000 mg | DELAYED_RELEASE_TABLET | Freq: Every day | ORAL | Status: DC
  Administered 2015-01-13 – 2015-01-18 (×6): 40 mg via ORAL
  Filled 2015-01-13 (×6): qty 1

## 2015-01-13 MED ORDER — SODIUM CHLORIDE 0.9 % IV SOLN
INTRAVENOUS | Status: DC
  Administered 2015-01-13 – 2015-01-17 (×7): via INTRAVENOUS

## 2015-01-13 MED ORDER — CALCIUM CARBONATE 1500 (600 CA) MG PO TABS: ORAL_TABLET | Freq: Every day | ORAL | Status: DC

## 2015-01-13 NOTE — Therapy (Signed)
SLP Cancellation Note  Patient Details Name: [name] MRN: [id number] DOB: September 16, 1926   Cancelled treatment:        Orders received for BSE; MBS/diet ok. Pt currently off unit for testing.  Will continue efforts.   Pt was seen for outpatient MBS on Wednesday 01/12/15, with referral due to chronic cough. Results indicated functional swallow without penetration or aspiration of any consistency. Minimal post-swallow residue was managed effectively with dry swallow. Regular diet with thin liquids was recommended.  No treatment recommended.  Pt now admitted with [facility] PNA. ST to follow up when pt available.  Philana Younis B. Quentin [facility], CCC-SLP [phone]  Shonna Chock 01/13/2015, 1:56 PM

## 2015-01-13 NOTE — Evaluation (Signed)
Physical Therapy Evaluation Patient Details Name: [name] MRN: [id number] DOB: 02/24/1926 Today's Date: 01/13/2015   History of Present Illness  [name] is back with her husband and caregiver today for reevaluation.   More lethargic for the past 2 weeks.  Had to start feeding her on Sunday.  Pain in right shoulder and middle of back started last week.  Pain worse when moving.  Taking Tramadol every 6 hours and Ibuprofen. She was seen by Avanell Shackleton, NP on 1/18 with increased lethargy, cough, weakness.  Chest x-ray done at [facility] showed right lower lobe infiltrate.  Note history of several falls and moderate Alzheimer's Disease.   Clinical Impression  Pt presents with above.  No family in room at time of eval, but per notes has husband available 24/7 and has caregiver but unsure if they are there 24/7.  At this time, she requires total A to +2 assist for all mobility.  If home is plan, palliative care consult may be appropriate at this time.  Also feel she will need a mechanical lift and hospital bed at home, otherwise PT recommends SNF with Alzheimer's unit.  PT will continue to follow acutely to determine if she progresses.     Follow Up Recommendations Home health PT;SNF;Supervision/Assistance - 24 hour    Equipment Recommendations  Wheelchair (measurements PT);Wheelchair cushion (measurements PT);Hospital bed;Other (comment) (mechanical lift if home is plan)    Recommendations for Other Services       Precautions / Restrictions Precautions Precautions: Fall Precaution Comments: weakness and pain in R shoulder/scapular region from previous fall, hx of Alzheimer's Restrictions Weight Bearing Restrictions: No      Mobility  Bed Mobility Overal bed mobility: Needs Assistance;+2 for physical assistance Bed Mobility: Supine to Sit;Sit to Supine     Supine to sit: Total assist Sit to supine: +2 for physical assistance   General bed mobility comments: Pt requires  total to +2 assist for bed mobility with pt initiating no movement during task.    Transfers Overall transfer level: Needs assistance Equipment used: None Transfers: Sit to/from Stand Sit to Stand: Max assist;Total assist         General transfer comment: Pt somewhat initiated maintaining standing once up, however did not assist with getting into standing.    Ambulation/Gait Ambulation/Gait assistance:  (not assess due to safety. )              Stairs            Wheelchair Mobility    Modified Rankin (Stroke Patients Only)       Balance Overall balance assessment: Needs assistance Sitting-balance support: Feet supported;Single extremity supported Sitting balance-Leahy Scale: Fair Sitting balance - Comments: Pt was able to sit at EOB without support from therapist and only UE support on lap   Standing balance support: During functional activity;Bilateral upper extremity supported Standing balance-Leahy Scale: Zero                               Pertinent Vitals/Pain Pain Assessment: Faces Faces Pain Scale: Hurts whole lot Pain Location: R scap/shoulder region with all mobility Pain Intervention(s): Monitored during session;Repositioned (RN made aware)    Home Living Family/patient expects to be discharged to:: Private residence Living Arrangements: Spouse/significant other Available Help at Discharge: Personal care attendant;Family Type of Home: House           Additional Comments: No family or caregiver present at  time of eval.  Note that per MD note she was at home with husband and caregiver.  Unsure if caregiver was there 24/7 and also per notes, hx of multiple falls and she has not walked very much in last week.     Prior Function Level of Independence: Needs assistance         Comments: No family/caregiver present, pt likely needs assist for all ADLs and gait     Hand Dominance        Extremity/Trunk Assessment                Lower Extremity Assessment: RLE deficits/detail;LLE deficits/detail RLE Deficits / Details: Pt would not actively perform movement of LEs during therapy session.        Communication      Cognition Arousal/Alertness: Lethargic Behavior During Therapy: Flat affect Overall Cognitive Status: No family/caregiver present to determine baseline cognitive functioning                      General Comments      Exercises        Assessment/Plan    PT Assessment Patient needs continued PT services  PT Diagnosis Difficulty walking;Generalized weakness;Acute pain   PT Problem List Decreased strength;Decreased activity tolerance;Decreased range of motion;Decreased balance;Decreased mobility;Decreased cognition;Pain  PT Treatment Interventions DME instruction;Gait training;Functional mobility training;Therapeutic activities;Therapeutic exercise;Balance training;Patient/family education   PT Goals (Current goals can be found in the Care Plan section) Acute Rehab PT Goals Patient Stated Goal: n/a PT Goal Formulation: Patient unable to participate in goal setting Time For Goal Achievement: 01/27/15 Potential to Achieve Goals: Poor    Frequency Min 2X/week   Barriers to discharge Decreased caregiver support Unsure if caregiver provides 24/7 assist    Co-evaluation               End of Session   Activity Tolerance: Patient limited by pain Patient left: in bed;with call bell/phone within reach;with nursing/sitter in room Nurse Communication: Mobility status;Need for lift equipment         Time: 2025-4270 PT Time Calculation (min) (ACUTE ONLY): 18 min   Charges:   PT Evaluation $Initial PT Evaluation Tier I: 1 Procedure PT Treatments $Therapeutic Activity: 8-22 mins   PT G Codes:        Denice Bors 01/13/2015, 12:18 PM

## 2015-01-13 NOTE — H&P (Signed)
Height: 63.75 in., ( 161.93 cm) Temperature: 97.9 deg F, Temperature site: oral Pulse rate: 76 Pulse rhythm: regular  Blood Pressure #1: 140 / 62 mm Hg   Vitals entered by: Sonia Side RMA on January 13, 2015 9:41 AM    Risk Factors:   Smoked Tobacco Use:  Former smoker    Cigarettes:  Yes -- 1.5 pack(s) per day,      Year started:  1947       Year quit:  1951       Years Since Last Quit:  67 Smokeless Tobacco Use:  Never Passive smoke exposure:  no Drug use:  no HIV high-risk behavior:  no Caffeine use:  0 drinks per day Alcohol use:  no Exercise:  no Seatbelt use:  100 % Sun Exposure:  rarely  Previous Tobacco Use: Signed On - 01/10/2015 Smoked Tobacco Use:  Former smoker    Cigarettes:  Yes -- 1.5 pack(s) per day,      Year started:  1947       Year quit:  1951       Years Since Last Quit:  67 years, 0 months, 20 days Smokeless Tobacco Use:  Never    Counseled to quit/cut down:  yes Passive smoke exposure:  no Drug use:  no HIV high-risk behavior:  no Caffeine use:  0 drinks per day  Previous Alcohol Use: Signed On - 01/10/2015 Alcohol use:  no Exercise:  no Seatbelt use:  100 % Sun Exposure:  rarely  History of Present Illness  History from: patient Reason for visit: See chief complaint Chief Complaint: complains on right shoulder blade pain that moves down . Her back is very tight almost to the middle of her back. She sleeps alot and unable to get her to drink like she should. History of Present Illness: [name] is back with her husband and caregiver today for reevaluation.   More lethargic for the past 2 weeks.  Had to start feeding her on Sunday.  Pain in right shoulder and middle of back started last week.  Pain worse when moving.  Taking Tramadol every 6 hours and Ibuprofen. She was seen by Avanell Shackleton, NP on 1/18 with increased lethargy, cough, weakness.  Chest x-ray done at [facility] showed right lower lobe infiltrate.  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  Eating Ok.  Not drinking well.  Seems to be holding fluids and pills in her mouth.  Cough is getting better.    Dementia is progressing.  Increased Depakote to tid in 8/15 due to increased sundowning and agitation.    Multiple falls recently- has not hit head since Christmas when she fell back on back of her head.  No loss of consciousness.  No known injuries.  Generally just slumps to the ground when she falls.  Has not been walking at all for the past week.     Review of Systems  General:       Complains of anorexia, fatigue, malaise.        Denies fevers, chills, sweats.   Eyes:       Denies vision loss.   Cardiovascular:       Denies chest pains, dyspnea on exertion, peripheral edema.   Respiratory:       Complains of cough.        Denies dyspnea, wheezing.   Gastrointestinal:       Denies diarrhea, heartburn.        No BM X  2 days Genitourinary:       urine is more concentrated Musculoskeletal:       right shoulder and thoracic back pain Skin:       Denies rash.   Neurologic:       Complains of weakness, gait instability.   Psychiatric:       Complains of memory loss.   Endocrine:       Denies polydipsia, polyuria.   Heme/Lymphatic:       Denies bleeding.    Past History Past Medical History (reviewed - no changes required): HTN Hyperlipidemia Dementia, AD (MMSE 26/30 in 11/11-->23/30 in 1/13--> 26/30 in 1/14-->17/30 in 1/15)- enrolled in Dementia study Bladder cancer s/p cystectomy/ureterostomy (2002) SVT Vitamin D deficiency Carotid stenosis (left 40-59%, right 20-39%- 10/10)- followed by Dr. Donnetta Hutching Osteopenia Syncope Anxiety Asthma Cerical disc dz Pyelonephritis LBBB Ventricular tachycardia CAD- nonobstructive (10/08)- 40% LAD PVCs Headaches Cervical spondylosis Heartburn Hydronephrosis Hypoglycemia Rectocele Microscopic hematuria Surgical History (reviewed - no changes required): L wrist Fx TAH/BSO Hernia repair Neck  surgery Cystectomy/ureterostomy Family History (reviewed - no changes required): Father deceased at 74 (lung cancer). Mother deceased at 31 (heart dz, MI, HTN). Sister deceased at 2 (heart dz). 2 sons, 1 daughter- all healthy. Social History (reviewed - no changes required): Remarried since 2004, 3 children w/ first husband, 8 grandchildren.  Children in [place]. Moved from [place], [place]. High school education. Homemaker. Former smoker (quit age 45), moderate alcohol consumption.  Family History Summary:     Reviewed history Last on 01/10/2015 and no changes required:01/13/2015 Father Ailene Ravel.) - Has Family History of Lung Cancer - Entered On: 11/09/2013 Mother Ailene Ravel.) - Has Family History of Hypertension - Entered On: 02/02/2014  General Comments - FH: Father deceased at 31 (lung cancer). Mother deceased at 59 (heart dz, MI, HTN). Sister deceased at 41 (heart dz). 2 sons, 1 daughter- all healthy.  Social History:    Reviewed history from 11/10/2009 and no changes required:       Remarried since 2004, 3 children w/ first husband, 8 grandchildren.  Children in [place].       Moved from [place], [place].       High school education.       Homemaker.       Former smoker (quit age 32), moderate alcohol consumption.   Physical Exam  General appearance: lethargic, disoriented, in obvious discomfort with moving  Eyes  External: conjunctivae and lids normal Pupils: equal, round, reactive to light and accommodation  Ears, Nose and Throat  External ears: normal, no lesions or deformities External nose: normal, no lesions or deformities Otoscopic: canals clear, tympanic membranes intact, no fluid Hearing: grossly intact Nasal: mucosa, septum, and turbinates normal Dental: good dentition Pharynx: tongue normal, protrudes mid line,  posterior pharynx without erythema or exudate  Neck  Neck: supple, no masses, trachea midline Thyroid: no nodules, masses, tenderness, or  enlargement  Respiratory  Respiratory effort: no intercostal retractions or use of accessory muscles Auscultation: no rales, rhonchi, or wheezes-mod cough urgency  Cardiovascular  Auscultation: RRR with Grade 2/6 SEM Carotid arteries: bilateral carotid bruits Pedal pulses: pulses 2+, symmetric Periph. circulation: small vericose veins to bilateral extremities, no cyanosis, clubbing, edema  Gastrointestinal  Abdomen: soft, non-tender, no masses, bowel sounds normal-utererostomy assessed, urine clear/yellow Liver and spleen: no enlargement or nodularity  Lymphatic  Neck: no cervical adenopathy Axillae: no axillary adenopathy  Musculoskeletal  Gait and station: wheelchair Digits and nails: no clubbing, cyanosis, petechiae, or nodes Head and neck:  tender right neck/trapezius Spine, ribs, pelvis: normal alignment and mobility, no deformity RUE: severe pain with movement of right shoulder; some suprascapular swelling LUE: normal ROM and strength, no joint enlargement or tenderness RLE: normal ROM and strength, no joint enlargement or tenderness LLE: normal ROM and strength, no joint enlargement or tenderness  Skin  Inspection: no rashes, lesions  Mental Status Exam  Judgment, insight: poor Orientation: disoriented to person Memory: global memory impairment Mood and affect: lethargic   Impression & Recommendations:  Problem # 1:  Community acquired pneumonia (ICD-480.8) (ICD10-J18.9) She's become more lethargic with poor by mouth intake, which may be secondary to pneumonia.  Her cough has improved with Levaquin.  We'll admit for IV fluids, IV antibiotics, and repeat chest x-ray.  Problem # 2:  Back pain, thoracic region (ICD-724.1) (ICD10-M54.6) Severe right scapular pain with movement is likely musculoskeletal.  Will obtain thoracic and right shoulder x-rays for further evaluation to rule out fracture in the setting of recent falls.  Problem # 3:  Altered mental status  (ICD-780.97) (ICD10-R41.82) She has become progressively more lethargic over the past week.  This is multifactorial and may be secondary to pneumonia, pain, medications, and dehydration.  Admit for IV fluids.  Obtain head CT to rule out subdural hematoma in the setting of recent falls (hit her head about a month ago)  Problem # 4:  Muscle weakness (generalized) (ICD-728.87) (ICD10-M62.81) We'll obtain physical therapy and occupational therapy consults.  May need brief rehabilitation stay.  Has excellent assistance and home with her husband and caregiver  Problem # 5:  Hx of BLADDER CANCER (ICD-188.9) (ICD10-C67.9) Obtain urinalysis and culture.  Problem # 6:  Alzheimer's disease, moderate (ICD-331.0) (ICD10-G30.9) Progressive dementia.  Continue Depakote (may need to decrease to twice a day if lethargy persists).  Complete Medication List: 1)  Levaquin 500 Mg Oral Tabs (Levofloxacin) .Marland Kitchen.. 1 daily for 7 days 2)  Tessalon Perles 100 Mg Oral Caps (Benzonatate) .... One po every 8hrs as needed for cough 3)  Prilosec 20 Mg Oral Cpdr (Omeprazole) .... One po twice a day 4)  Hydromet 5-1.5 Mg/32ml Oral Syrp (Hydrocodone-homatropine) .... 5 ml twice a day 5)  Temazepam 15 Mg Oral Caps (Temazepam) .... One po at bedtime 6)  Reclast 5 Mg/14ml Iv Soln (Zoledronic acid) .... Infuse 5mg  once a year (start may 2015) 7)  Nortriptyline Hcl 25 Mg Oral Caps (Nortriptyline hcl) .... One po every day 8)  Depakote 250 Mg Tbec (Divalproex sodium) .Marland Kitchen.. 1 pill every 8 hours 9)  Amlodipine Besylate 5 Mg Tabs (Amlodipine besylate) .Marland Kitchen.. 1 tab po qd 10)  Aspirin 81 Mg Ec Tab (Aspirin) .... Take one (1) tablet by mouth daily 11)  Atenolol 25 Mg Tabs (Atenolol) .Marland Kitchen.. 1 tab po bid 12)  Crestor 10 Mg Tabs (Rosuvastatin calcium) .Marland Kitchen.. 1 tab po qod 13)  Donepezil Hcl 10 Mg Tabs (Donepezil hcl) .... Take one tablet by mouth daily 14)  Multivitamins Tabs (Multiple vitamin) .... Take one tablet by mouth every day 15)  Ultram 50  Mg Tabs (Tramadol hcl) .... Take one tablet every 8 hours as needed. 16)  Premarin 0.625 Mg/gm Crea (Estrogens, conjugated) .... 0.5 grams per vagina twice a week occasionally 17)  Namenda Xr 28 Mg Xr24h-cap (Memantine hcl) .Marland Kitchen.. 1 pill daily 18)  Mucinex Dm 30-600 Mg Xr12h-tab (Dextromethorphan-guaifenesin) .... Prn 19)  Zyrtec Allergy 10 Mg Caps (Cetirizine hcl) .... Prn  Screening: Weight Contraindication: Other Medical Reasons  Comments: patient admitted  Electronically signed  by Link Snuffer MD on 01/13/2015 at 10:14 AM

## 2015-01-13 NOTE — Evaluation (Signed)
Occupational Therapy Evaluation and Discharge Patient Details Name: [name] MRN: [id number] DOB: [birth year] Today's Date: 01/13/2015    History of Present Illness Pt has been more lethargic for the past 2 weeks.  Had to start feeding her on Sunday.  Pain in right shoulder and middle of back started last week.  Pain worse when moving. She was seen by Avanell Shackleton, NP on 1/18 with increased lethargy, cough, weakness.  Chest x-ray done at [facility] showed right lower lobe infiltrate.  Note history of several falls.    Clinical Impression   This 81 yo female admitted with above presents to acute OT with decreased cognition (worse than baseline), decreased mobility (worse than baseline of 2 weeks ago), decreased balance all affecting her ability to help take care of herself. She will benefit from continued OT at SNF to increase mobility so she can A more with her BADLs.    Follow Up Recommendations  SNF    Equipment Recommendations  None recommended by OT       Precautions / Restrictions Precautions Precautions: Fall Precaution Comments: weakness and pain in R shoulder/scapular region from previous fall, hx of Alzheimer's Restrictions Weight Bearing Restrictions: No      Mobility Bed Mobility Overal bed mobility: Needs Assistance Bed Mobility: Supine to Sit     Supine to sit: Total assist     General bed mobility comments: A'd with moving RLE only  Transfers Overall transfer level: Needs assistance Equipment used: 1 person hand held assist Transfers: Sit to/from Stand Sit to Stand: Min assist (once got her weight forward over her toes)         General transfer comment: She ambulated with me standing on her right side and holding her left hand with min A and heavy handidness around her back with my left arm to faciliate forward motion. When starts turn and sit she "freezes" in place and cannot problem solve to get seated    Balance Overall balance  assessment: Needs assistance Sitting-balance support: No upper extremity supported;Feet supported Sitting balance-Leahy Scale: Fair     Standing balance support: Single extremity supported Standing balance-Leahy Scale: Poor                              ADL                                         General ADL Comments: was total A pta except for some grooming tasks               Pertinent Vitals/Pain Pain Assessment: Faces Faces Pain Scale: Hurts little more Pain Location: could not state or point where, was just saying oh, oh, oh with supine to sit Pain Intervention(s): Monitored during session     Hand Dominance Right   Extremity/Trunk Assessment Upper Extremity Assessment Upper Extremity Assessment: Overall WFL for tasks assessed           Communication Communication Communication:  (talks, but does not often respond with answers that would go with question asked)   Cognition Arousal/Alertness: Awake/alert Behavior During Therapy: Flat affect Overall Cognitive Status: Impaired/Different from baseline Area of Impairment:  (not as easily directed or following commands as easily as was 2 weeks ago)  Home Living Family/patient expects to be discharged to:: Private residence Living Arrangements: Spouse/significant other Available Help at Discharge: Personal care attendant;Family;Available 24 hours/day (spouse and PCA at all times) Type of Home: House                           Additional Comments: Per husband she was walking OK about 2 weeks ago (still needed someone with her--shuffling steps, did not sit down or be still much)      Prior Functioning/Environment Level of Independence: Needs assistance  Gait / Transfers Assistance Needed: Up until 2 weeks ago she could be up with someone with her when she was up on her feet, then they started to actually having to A her with +2 ADL's /  Homemaking Assistance Needed: Total A  for B/D/toileting; could wash her face and brush her teeth post setup wtih cues for thoroughness        OT Diagnosis: Generalized weakness;Cognitive deficits   OT Problem List: Decreased strength;Decreased activity tolerance;Impaired balance (sitting and/or standing);Decreased cognition;Decreased safety awareness      OT Goals(Current goals can be found in the care plan section) Acute Rehab OT Goals Patient Stated Goal: pt unable; caregiver (Kathy)--to rehab then home  OT Frequency:                End of Session Equipment Utilized During Treatment: Gait belt Nurse Communication: Mobility status (and that caregiver from home will let them know before she leaves so staff can put pt back to bed with alarm before she leaves)  Activity Tolerance: Patient limited by fatigue Patient left: in chair;with call bell/phone within reach;with family/visitor present (her paid caregiver from home)   Time: 7290-2111 OT Time Calculation (min): 54 min Charges:  OT General Charges $OT Visit: 1 Procedure OT Evaluation $Initial OT Evaluation Tier I: 1 Procedure OT Treatments $Self Care/Home Management : 8-22 mins $Therapeutic Activity: 23-37 mins  Almon Register [phone] 01/13/2015, 5:32 PM

## 2015-01-14 ENCOUNTER — Inpatient Hospital Stay (HOSPITAL_COMMUNITY): Payer: Medicare Other

## 2015-01-14 DIAGNOSIS — M898X1 Other specified disorders of bone, shoulder: Secondary | ICD-10-CM | POA: Diagnosis present

## 2015-01-14 DIAGNOSIS — R4182 Altered mental status, unspecified: Secondary | ICD-10-CM | POA: Diagnosis present

## 2015-01-14 DIAGNOSIS — R296 Repeated falls: Secondary | ICD-10-CM

## 2015-01-14 LAB — BASIC METABOLIC PANEL
Anion gap: 7 (ref 5–15)
BUN: 17 mg/dL (ref 6–23)
CO2: 29 mmol/L (ref 19–32)
CREATININE: 0.52 mg/dL (ref 0.50–1.10)
Calcium: 9 mg/dL (ref 8.4–10.5)
Chloride: 98 mEq/L (ref 96–112)
GFR calc Af Amer: >90 mL/min (ref >90)
GFR, EST NON AFRICAN AMERICAN: 84 mL/min — AB (ref >90)
GLUCOSE: 117 mg/dL — AB (ref 70–99)
POTASSIUM: 4.5 mmol/L (ref 3.5–5.1)
Sodium: 134 mmol/L — ABNORMAL LOW (ref 135–145)

## 2015-01-14 LAB — CBC
HEMATOCRIT: 31.5 % — AB (ref 36.0–46.0)
Hemoglobin: 10.3 g/dL — ABNORMAL LOW (ref 12.0–15.0)
MCH: 30.9 pg (ref 26.0–34.0)
MCHC: 32.7 g/dL (ref 30.0–36.0)
MCV: 94.6 fL (ref 78.0–100.0)
PLATELETS: 204 10*3/uL (ref 150–400)
RBC: 3.33 MIL/uL — ABNORMAL LOW (ref 3.87–5.11)
RDW: 13.4 % (ref 11.5–15.5)
WBC: 7.7 10*3/uL (ref 4.0–10.5)

## 2015-01-14 MED ORDER — LEVOFLOXACIN 750 MG PO TABS
750.0000 mg | ORAL_TABLET | Freq: Every day | ORAL | Status: DC
  Administered 2015-01-15 – 2015-01-17 (×3): 750 mg via ORAL
  Filled 2015-01-14 (×4): qty 1

## 2015-01-14 NOTE — Care Management Note (Signed)
  Page 1 of 1   01/17/2015     10:07:09 AM CARE MANAGEMENT NOTE 01/17/2015  Patient:  [name], [name]   Account Number:  [id number]  Date Initiated:  01/14/2015  Documentation initiated by:  Magdalen Spatz  Subjective/Objective Assessment:     Action/Plan:   Anticipated DC Date:  01/17/2015   Anticipated DC Plan:           Choice offered to / List presented to:  C-3 Spouse        HH arranged  HH-2 PT  HH-3 OT      [place].   Status of service:  Completed, signed off Medicare Important Message given?  YES (If response is "NO", the following Medicare IM given date fields will be blank) Date Medicare IM given:  01/17/2015 Medicare IM given by:  Magdalen Spatz Date Additional Medicare IM given:   Additional Medicare IM given by:    Discharge Disposition:  [place]  Per UR Regulation:  Reviewed for med. necessity/level of care/duration of stay  If discussed at [place] of Stay Meetings, dates discussed:    Comments:  01-17-15 Spoke with patient's husband via phone , [name] cell [phone] , home [phone] . Patient already has wheelchair , PT recommending Herrera bed and lift . MR [name] stated he does not want Herrera or lift at this time , when he does he will call [name] office . Magdalen Spatz RN BSN [phone]      01-14-15 IM given by admitting on 01-13-15 . Awaiting PT/ OT evals for recommendations for disposition. Magdalen Spatz RN BSN

## 2015-01-14 NOTE — Progress Notes (Signed)
Subjective: She was resting comfortably without complaint until I tried to move her- then she began complaining of pain, pointing to left neck.  Remains disoriented.  Objective: Vital signs in last 24 hours: Temp:  [98.4 F (36.9 C)-99 F (37.2 C)] 99 F (37.2 C) (01/22 0546) Pulse Rate:  [71-84] 76 (01/22 0546) Resp:  [17-18] 18 (01/22 0546) BP: (115-139)/(40-58) 115/41 mmHg (01/22 0546) SpO2:  [95 %-100 %] 95 % (01/22 0546) Weight:  [69.627 kg (153 lb 8 oz)] 69.627 kg (153 lb 8 oz) (01/21 1110) Weight change:     CBG (last 3)  No results for input(s): GLUCAP in the last 72 hours.  Intake/Output from previous day: 01/21 0701 - 01/22 0700 In: -  Out: 1250 [Urine:1250] Intake/Output this shift:    General appearance: disoriented, some difficulty with word finding; in pain with movement Eyes: no scleral icterus Throat: oropharynx moist without erythema Resp: clear to auscultation bilaterally Cardio: regular rate and rhythm and grade 2/6 SEM GI: soft, non-tender; bowel sounds normal; no masses,  no organomegaly Extremities: no clubbing, cyanosis or edema Neck: pain with palpation of neck, trapezius and bilateral suprascapular region   Lab Results:  Recent Labs  01/13/15 1130 01/14/15 0607  NA 136 134*  K 4.8 4.5  CL 100 98  CO2 29 29  GLUCOSE 124* 117*  BUN 23 17  CREATININE 0.65 0.52  CALCIUM 9.4 9.0    Recent Labs  01/13/15 1130  AST 26  ALT 41*  ALKPHOS 65  BILITOT 0.5  PROT 6.5  ALBUMIN 2.7*    Recent Labs  01/13/15 1130 01/14/15 0607  WBC 9.6 7.7  NEUTROABS 7.1  --   HGB 11.4* 10.3*  HCT 35.3* 31.5*  MCV 95.1 94.6  PLT 209 204   Lab Results  Component Value Date   INR 1.01 08/02/2013   INR 1.2 07/22/2008   INR 1.0 06/06/2008   No results for input(s): CKTOTAL, CKMB, CKMBINDEX, TROPONINI in the last 72 hours. No results for input(s): TSH, T4TOTAL, T3FREE, THYROIDAB in the last 72 hours.  Invalid input(s): FREET3 No results for  input(s): VITAMINB12, FOLATE, FERRITIN, TIBC, IRON, RETICCTPCT in the last 72 hours.  Studies/Results: X-ray Chest Pa And Lateral  01/13/2015   CLINICAL DATA:  81 year old with altered mental status and pneumonia.  EXAM: CHEST  2 VIEW  COMPARISON:  01/26/2014  FINDINGS: Evidence for scarring along the lung apices. Otherwise, the lungs are clear. Heart and mediastinum are within normal limits. No evidence for pleural effusions. No acute bone abnormality.  IMPRESSION: No active cardiopulmonary disease.   Electronically Signed   By: Markus Daft M.D.   On: 01/13/2015 14:37   Dg Thoracic Spine 2 View  01/13/2015   CLINICAL DATA:  Acute middle back pain for 1 week.  EXAM: THORACIC SPINE - 2 VIEW  COMPARISON:  January 26, 2014.  FINDINGS: No fracture or spondylolisthesis is noted. Moderate dextroscoliosis of thoracolumbar junction is noted. Minimal degenerative changes are noted in the lower thoracic spine.  IMPRESSION: No acute abnormality seen in the thoracic spine.   Electronically Signed   By: Sabino Dick M.D.   On: 01/13/2015 14:25   Dg Shoulder Right  01/13/2015   CLINICAL DATA:  Acute right shoulder pain for 1 week.  EXAM: RIGHT SHOULDER - 2+ VIEW  COMPARISON:  None.  FINDINGS: There is no evidence of fracture or dislocation. Mild degenerative changes seen involving the right acromioclavicular joint. Visualized ribs appear in. Soft tissues are unremarkable.  IMPRESSION: Mild degenerative change of the right acromioclavicular joint. No acute abnormality seen in the right shoulder.   Electronically Signed   By: Sabino Dick M.D.   On: 01/13/2015 14:21   Ct Head Wo Contrast  01/13/2015   CLINICAL DATA:  Altered mental status.  EXAM: CT HEAD WITHOUT CONTRAST  TECHNIQUE: Contiguous axial images were obtained from the base of the skull through the vertex without intravenous contrast.  COMPARISON:  CT scan of July 21, 2008.  FINDINGS: Bony calvarium appears intact. Mild diffuse cortical atrophy is noted. Mild  chronic ischemic white matter disease is noted. Ventricular size is within normal limits. Old bilateral basal ganglia lacunar infarctions are noted. No mass lesion or hemorrhage is noted. New low density is noted inferiorly in the left cerebellar hemisphere concerning for acute infarction. No midline shift is noted.  IMPRESSION: New low density is noted inferiorly in left cerebellar hemisphere concerning for acute infarction. MRI may be performed for further evaluation. These results will be called to the ordering clinician or representative by the Radiologist Assistant, and communication documented in the PACS or zVision Dashboard.   Electronically Signed   By: Sabino Dick M.D.   On: 01/13/2015 14:53   Mr Virgel Paling Wo Contrast  01/13/2015   CLINICAL DATA:  81 year old female with increasing lethargy over the past 2 weeks. Dementia. Multiple falls. Hit head 12/17/2014. No loss of consciousness. Initial encounter.  EXAM: MRI HEAD WITHOUT AND WITH CONTRAST  MRA HEAD WITHOUT CONTRAST  TECHNIQUE: Multiplanar, multiecho pulse sequences of the brain and surrounding structures were obtained without and with intravenous contrast. Angiographic images of the head were obtained using MRA technique without contrast.  CONTRAST:  15 cc MultiHance  COMPARISON:  01/13/2015 CT.  06/15/2013 and 12/03/2003 MR.  FINDINGS: MRI HEAD FINDINGS  Exam is motion degraded.  No acute infarct.  No intracranial hemorrhage.  Mild small vessel disease type changes.  Bilateral globus pallidus abnormality unchanged since 2004. This may reflect prominent perivascular spaces as there is no gliosis in this region.  No intracranial mass or abnormal enhancement.  Atrophy. Ventricular prominence slightly more notable than on the 2014 MR. I suspect this is related to central atrophy although would be difficult to completely exclude a mild component of hydrocephalus.  Partial opacification left mastoid air cells.  Mild cervical spondylotic changes and  spinal stenosis C4-5 and less so C3-4.  Cervical medullary junction unremarkable. Partially empty sella incidentally detected. Pineal region and orbital structures unremarkable.  MRA HEAD FINDINGS  Moderate narrowing portions of the A1 and A2 segment of the left anterior cerebral artery.  Middle cerebral artery moderate narrowing and irregularity bilaterally.  Fetal type contribution to the right posterior cerebral artery.  Mild narrowing right vertebral artery.  Nonvisualized left posterior inferior cerebellar artery and right anterior inferior cerebellar artery.  Posterior cerebral artery distal branch vessel irregularity bilaterally with mild narrowing proximal left posterior cerebral artery.  No aneurysm noted.  IMPRESSION: MRI HEAD  Exam is motion degraded.  No acute infarct.  Mild small vessel disease type changes.  Bilateral globus pallidus abnormality unchanged since 2004. This may reflect prominent perivascular spaces.  No intracranial mass or abnormal enhancement.  Atrophy. Ventricular prominence slightly more notable than on the 2014 MR. I suspect this is related to central atrophy although would be difficult to completely exclude a mild component of hydrocephalus.  Partial opacification left mastoid air cells.  Mild cervical spondylotic changes and spinal stenosis C4-5 and less so C3-4.  MRA HEAD FINDINGS  Branch vessel atherosclerotic type changes as noted above.   Electronically Signed   By: Chauncey Cruel M.D.   On: 01/13/2015 18:39   Mr Jeri Cos QP Contrast  01/13/2015   CLINICAL DATA:  81 year old female with increasing lethargy over the past 2 weeks. Dementia. Multiple falls. Hit head 12/17/2014. No loss of consciousness. Initial encounter.  EXAM: MRI HEAD WITHOUT AND WITH CONTRAST  MRA HEAD WITHOUT CONTRAST  TECHNIQUE: Multiplanar, multiecho pulse sequences of the brain and surrounding structures were obtained without and with intravenous contrast. Angiographic images of the head were obtained  using MRA technique without contrast.  CONTRAST:  15 cc MultiHance  COMPARISON:  01/13/2015 CT.  06/15/2013 and 12/03/2003 MR.  FINDINGS: MRI HEAD FINDINGS  Exam is motion degraded.  No acute infarct.  No intracranial hemorrhage.  Mild small vessel disease type changes.  Bilateral globus pallidus abnormality unchanged since 2004. This may reflect prominent perivascular spaces as there is no gliosis in this region.  No intracranial mass or abnormal enhancement.  Atrophy. Ventricular prominence slightly more notable than on the 2014 MR. I suspect this is related to central atrophy although would be difficult to completely exclude a mild component of hydrocephalus.  Partial opacification left mastoid air cells.  Mild cervical spondylotic changes and spinal stenosis C4-5 and less so C3-4.  Cervical medullary junction unremarkable. Partially empty sella incidentally detected. Pineal region and orbital structures unremarkable.  MRA HEAD FINDINGS  Moderate narrowing portions of the A1 and A2 segment of the left anterior cerebral artery.  Middle cerebral artery moderate narrowing and irregularity bilaterally.  Fetal type contribution to the right posterior cerebral artery.  Mild narrowing right vertebral artery.  Nonvisualized left posterior inferior cerebellar artery and right anterior inferior cerebellar artery.  Posterior cerebral artery distal branch vessel irregularity bilaterally with mild narrowing proximal left posterior cerebral artery.  No aneurysm noted.  IMPRESSION: MRI HEAD  Exam is motion degraded.  No acute infarct.  Mild small vessel disease type changes.  Bilateral globus pallidus abnormality unchanged since 2004. This may reflect prominent perivascular spaces.  No intracranial mass or abnormal enhancement.  Atrophy. Ventricular prominence slightly more notable than on the 2014 MR. I suspect this is related to central atrophy although would be difficult to completely exclude a mild component of  hydrocephalus.  Partial opacification left mastoid air cells.  Mild cervical spondylotic changes and spinal stenosis C4-5 and less so C3-4.  MRA HEAD FINDINGS  Branch vessel atherosclerotic type changes as noted above.   Electronically Signed   By: Chauncey Cruel M.D.   On: 01/13/2015 18:39     Medications: Scheduled: . amLODipine  5 mg Oral Daily  . aspirin EC  81 mg Oral Daily  . atenolol  25 mg Oral BID  . calcium carbonate  1 tablet Oral Q breakfast  . divalproex  250 mg Oral BID  . docusate sodium  100 mg Oral Daily  . donepezil  10 mg Oral Daily  . enoxaparin (LOVENOX) injection  40 mg Subcutaneous Q24H  . levofloxacin (LEVAQUIN) IV  750 mg Intravenous Q24H  . memantine  28 mg Oral Daily  . nortriptyline  25 mg Oral QHS  . pantoprazole  40 mg Oral Daily   Continuous: . sodium chloride 125 mL/hr at 01/13/15 1252    Assessment/Plan: Active Problems: 1. Altered mental status/ Delirium superimposed on dementia- slight improvement in level of alertness with IV fluids and antibiotics.  CT was concerning for acute  cerebellar CVA but MRI shows no acute infarct or abnormality.  Suspect delirium secondary to pain +/- infection.  Urine culture pending (has ureterostomy).  Continue empiric Levaquin for pneumonia.  Continue IV fluids.   2. Pneumonia-repeat CXR shows resolution of infiltrate.  Continue Levaquin through Sunday.  Speech Therapy eval as outpatient showed no evidence of aspiration. 3. Recurrent falls- continue PT/OT.  May need SNF rehab. 4. Pain of right scapula/neck- unclear etiology- Thoracic and CXR negative.  Likely muscular given positional nature and tenderness. Obtain C-spine X-ray as pain seems more in her neck today.  Continue PT/OT, Tramadol for pain. 5. Hypertension- Continue home meds. 6. Disposition- anticipate discharge to home with 24 hour assistance vs. SNF in 2-3 days.   LOS: 1 day   Marton Redwood 01/14/2015, 7:35 AM

## 2015-01-14 NOTE — Progress Notes (Signed)
SLP Cancellation Note  Patient Details Name: [name] MRN: [id number] DOB: [birth year]   Cancelled treatment:       Reason Eval/Treat Not Completed: SLP screened, no needs identified, will sign off. Pt underwent Outpatient MBS 01/12/15, which indicated no penetration or aspiration of any consistency. Regular diet with thin liquids was recommended, no treatment. Spoke with RN/pt/family who report decreased intake but tolerance of current diet.   Please reconsult if needs arise.  Celia B. Quentin Ore [facility], CCC-SLP [phone]  Shonna Chock 01/14/2015, 12:21 PM

## 2015-01-15 LAB — BASIC METABOLIC PANEL
ANION GAP: 10 (ref 5–15)
BUN: 12 mg/dL (ref 6–23)
CALCIUM: 8.7 mg/dL (ref 8.4–10.5)
CO2: 23 mmol/L (ref 19–32)
Chloride: 104 mmol/L (ref 96–112)
Creatinine, Ser: 0.57 mg/dL (ref 0.50–1.10)
GFR calc Af Amer: >90 mL/min (ref >90)
GFR calc non Af Amer: 82 mL/min — ABNORMAL LOW (ref >90)
Glucose, Bld: 99 mg/dL (ref 70–99)
Potassium: 4.1 mmol/L (ref 3.5–5.1)
Sodium: 137 mmol/L (ref 135–145)

## 2015-01-15 LAB — CBC
HEMATOCRIT: 31.4 % — AB (ref 36.0–46.0)
HEMOGLOBIN: 10 g/dL — AB (ref 12.0–15.0)
MCH: 30.6 pg (ref 26.0–34.0)
MCHC: 31.8 g/dL (ref 30.0–36.0)
MCV: 96 fL (ref 78.0–100.0)
PLATELETS: 191 10*3/uL (ref 150–400)
RBC: 3.27 MIL/uL — AB (ref 3.87–5.11)
RDW: 13.6 % (ref 11.5–15.5)
WBC: 6.6 10*3/uL (ref 4.0–10.5)

## 2015-01-15 MED ORDER — POLYETHYLENE GLYCOL 3350 17 G PO PACK
17.0000 g | PACK | Freq: Every day | ORAL | Status: DC
  Administered 2015-01-15 – 2015-01-18 (×4): 17 g via ORAL
  Filled 2015-01-15 (×6): qty 1

## 2015-01-15 NOTE — Progress Notes (Signed)
Subjective: Sitting up alert and interactive this AM. Ate well at breakfast per reports, no choking. No apparent pain. No BM in a few days  Objective: Vital signs in last 24 hours: Temp:  [98.2 F (36.8 C)-99.5 F (37.5 C)] 98.7 F (37.1 C) (01/23 8099) Pulse Rate:  [71-82] 71 (01/23 0608) Resp:  [16-18] 16 (01/23 0608) BP: (119-137)/(42-54) 132/45 mmHg (01/23 0608) SpO2:  [95 %-97 %] 97 % (01/23 8338)  Intake/Output from previous day: 01/22 0701 - 01/23 0700 In: 1620 [P.O.:120; I.V.:1500] Out: 1525 [Urine:1525] Intake/Output this shift: Total I/O In: 360 [P.O.:360] Out: -   Sitting up smiling in no distress. Face symmetric, oral membranes moist. Lungs clear ht regular abd soft NT, RLQ urostomy, no edema. Confused, clear speech  Lab Results   Recent Labs  01/14/15 0607 01/15/15 0332  WBC 7.7 6.6  RBC 3.33* 3.27*  HGB 10.3* 10.0*  HCT 31.5* 31.4*  MCV 94.6 96.0  MCH 30.9 30.6  RDW 13.4 13.6  PLT 204 191    Recent Labs  01/14/15 0607 01/15/15 0332  NA 134* 137  K 4.5 4.1  CL 98 104  CO2 29 23  GLUCOSE 117* 99  BUN 17 12  CREATININE 0.52 0.57  CALCIUM 9.0 8.7    Studies/Results: X-ray Chest Pa And Lateral  01/13/2015   CLINICAL DATA:  81 year old with altered mental status and pneumonia.  EXAM: CHEST  2 VIEW  COMPARISON:  01/26/2014  FINDINGS: Evidence for scarring along the lung apices. Otherwise, the lungs are clear. Heart and mediastinum are within normal limits. No evidence for pleural effusions. No acute bone abnormality.  IMPRESSION: No active cardiopulmonary disease.   Electronically Signed   By: Markus Daft M.D.   On: 01/13/2015 14:37   Dg Cervical Spine 2 Or 3 Views  01/14/2015   CLINICAL DATA:  Patient with diffuse neck and chest pain.  EXAM: CERVICAL SPINE - 2-3 VIEW  COMPARISON:  None.  FINDINGS: Straightening of the normal cervical lordosis with more focal kyphosis at the C4-5 level. Osseous fusion of the C5-6 vertebral level. Multilevel  degenerative change with disc space narrowing and anterior endplate osteophytosis most pronounced at C4-5 and C6-7. Nondiagnostic odontoid views as the dens is not able to be visualized. Markedly limited AP views given positioning and non inclusion of the entire cervical spine. Carotid arterial vascular calcifications. Apical pleural parenchymal thickening. Prevertebral soft tissues unremarkable.  IMPRESSION: Limited examination secondary to patient positioning.  Multilevel degenerative changes of the cervical spine with osseous fusion of the C5-6 levels.  Focal kyphosis at the C4-5 level.  Carotid arterial vascular calcifications.   Electronically Signed   By: Lovey Newcomer M.D.   On: 01/14/2015 10:06   Dg Thoracic Spine 2 View  01/13/2015   CLINICAL DATA:  Acute middle back pain for 1 week.  EXAM: THORACIC SPINE - 2 VIEW  COMPARISON:  January 26, 2014.  FINDINGS: No fracture or spondylolisthesis is noted. Moderate dextroscoliosis of thoracolumbar junction is noted. Minimal degenerative changes are noted in the lower thoracic spine.  IMPRESSION: No acute abnormality seen in the thoracic spine.   Electronically Signed   By: Sabino Dick M.D.   On: 01/13/2015 14:25   Dg Shoulder Right  01/13/2015   CLINICAL DATA:  Acute right shoulder pain for 1 week.  EXAM: RIGHT SHOULDER - 2+ VIEW  COMPARISON:  None.  FINDINGS: There is no evidence of fracture or dislocation. Mild degenerative changes seen involving the right acromioclavicular joint. Visualized  ribs appear in. Soft tissues are unremarkable.  IMPRESSION: Mild degenerative change of the right acromioclavicular joint. No acute abnormality seen in the right shoulder.   Electronically Signed   By: Sabino Dick M.D.   On: 01/13/2015 14:21   Ct Head Wo Contrast  01/13/2015   CLINICAL DATA:  Altered mental status.  EXAM: CT HEAD WITHOUT CONTRAST  TECHNIQUE: Contiguous axial images were obtained from the base of the skull through the vertex without intravenous  contrast.  COMPARISON:  CT scan of July 21, 2008.  FINDINGS: Bony calvarium appears intact. Mild diffuse cortical atrophy is noted. Mild chronic ischemic white matter disease is noted. Ventricular size is within normal limits. Old bilateral basal ganglia lacunar infarctions are noted. No mass lesion or hemorrhage is noted. New low density is noted inferiorly in the left cerebellar hemisphere concerning for acute infarction. No midline shift is noted.  IMPRESSION: New low density is noted inferiorly in left cerebellar hemisphere concerning for acute infarction. MRI may be performed for further evaluation. These results will be called to the ordering clinician or representative by the Radiologist Assistant, and communication documented in the PACS or zVision Dashboard.   Electronically Signed   By: Sabino Dick M.D.   On: 01/13/2015 14:53   Mr Virgel Paling Wo Contrast  01/13/2015   CLINICAL DATA:  81 year old female with increasing lethargy over the past 2 weeks. Dementia. Multiple falls. Hit head 12/17/2014. No loss of consciousness. Initial encounter.  EXAM: MRI HEAD WITHOUT AND WITH CONTRAST  MRA HEAD WITHOUT CONTRAST  TECHNIQUE: Multiplanar, multiecho pulse sequences of the brain and surrounding structures were obtained without and with intravenous contrast. Angiographic images of the head were obtained using MRA technique without contrast.  CONTRAST:  15 cc MultiHance  COMPARISON:  01/13/2015 CT.  06/15/2013 and 12/03/2003 MR.  FINDINGS: MRI HEAD FINDINGS  Exam is motion degraded.  No acute infarct.  No intracranial hemorrhage.  Mild small vessel disease type changes.  Bilateral globus pallidus abnormality unchanged since 2004. This may reflect prominent perivascular spaces as there is no gliosis in this region.  No intracranial mass or abnormal enhancement.  Atrophy. Ventricular prominence slightly more notable than on the 2014 MR. I suspect this is related to central atrophy although would be difficult to  completely exclude a mild component of hydrocephalus.  Partial opacification left mastoid air cells.  Mild cervical spondylotic changes and spinal stenosis C4-5 and less so C3-4.  Cervical medullary junction unremarkable. Partially empty sella incidentally detected. Pineal region and orbital structures unremarkable.  MRA HEAD FINDINGS  Moderate narrowing portions of the A1 and A2 segment of the left anterior cerebral artery.  Middle cerebral artery moderate narrowing and irregularity bilaterally.  Fetal type contribution to the right posterior cerebral artery.  Mild narrowing right vertebral artery.  Nonvisualized left posterior inferior cerebellar artery and right anterior inferior cerebellar artery.  Posterior cerebral artery distal branch vessel irregularity bilaterally with mild narrowing proximal left posterior cerebral artery.  No aneurysm noted.  IMPRESSION: MRI HEAD  Exam is motion degraded.  No acute infarct.  Mild small vessel disease type changes.  Bilateral globus pallidus abnormality unchanged since 2004. This may reflect prominent perivascular spaces.  No intracranial mass or abnormal enhancement.  Atrophy. Ventricular prominence slightly more notable than on the 2014 MR. I suspect this is related to central atrophy although would be difficult to completely exclude a mild component of hydrocephalus.  Partial opacification left mastoid air cells.  Mild cervical spondylotic changes and  spinal stenosis C4-5 and less so C3-4.  MRA HEAD FINDINGS  Branch vessel atherosclerotic type changes as noted above.   Electronically Signed   By: Chauncey Cruel M.D.   On: 01/13/2015 18:39   Mr Jeri Cos BJ Contrast  01/13/2015   CLINICAL DATA:  81 year old female with increasing lethargy over the past 2 weeks. Dementia. Multiple falls. Hit head 12/17/2014. No loss of consciousness. Initial encounter.  EXAM: MRI HEAD WITHOUT AND WITH CONTRAST  MRA HEAD WITHOUT CONTRAST  TECHNIQUE: Multiplanar, multiecho pulse sequences of  the brain and surrounding structures were obtained without and with intravenous contrast. Angiographic images of the head were obtained using MRA technique without contrast.  CONTRAST:  15 cc MultiHance  COMPARISON:  01/13/2015 CT.  06/15/2013 and 12/03/2003 MR.  FINDINGS: MRI HEAD FINDINGS  Exam is motion degraded.  No acute infarct.  No intracranial hemorrhage.  Mild small vessel disease type changes.  Bilateral globus pallidus abnormality unchanged since 2004. This may reflect prominent perivascular spaces as there is no gliosis in this region.  No intracranial mass or abnormal enhancement.  Atrophy. Ventricular prominence slightly more notable than on the 2014 MR. I suspect this is related to central atrophy although would be difficult to completely exclude a mild component of hydrocephalus.  Partial opacification left mastoid air cells.  Mild cervical spondylotic changes and spinal stenosis C4-5 and less so C3-4.  Cervical medullary junction unremarkable. Partially empty sella incidentally detected. Pineal region and orbital structures unremarkable.  MRA HEAD FINDINGS  Moderate narrowing portions of the A1 and A2 segment of the left anterior cerebral artery.  Middle cerebral artery moderate narrowing and irregularity bilaterally.  Fetal type contribution to the right posterior cerebral artery.  Mild narrowing right vertebral artery.  Nonvisualized left posterior inferior cerebellar artery and right anterior inferior cerebellar artery.  Posterior cerebral artery distal branch vessel irregularity bilaterally with mild narrowing proximal left posterior cerebral artery.  No aneurysm noted.  IMPRESSION: MRI HEAD  Exam is motion degraded.  No acute infarct.  Mild small vessel disease type changes.  Bilateral globus pallidus abnormality unchanged since 2004. This may reflect prominent perivascular spaces.  No intracranial mass or abnormal enhancement.  Atrophy. Ventricular prominence slightly more notable than on the  2014 MR. I suspect this is related to central atrophy although would be difficult to completely exclude a mild component of hydrocephalus.  Partial opacification left mastoid air cells.  Mild cervical spondylotic changes and spinal stenosis C4-5 and less so C3-4.  MRA HEAD FINDINGS  Branch vessel atherosclerotic type changes as noted above.   Electronically Signed   By: Chauncey Cruel M.D.   On: 01/13/2015 18:39    Scheduled Meds: . amLODipine  5 mg Oral Daily  . aspirin EC  81 mg Oral Daily  . atenolol  25 mg Oral BID  . calcium carbonate  1 tablet Oral Q breakfast  . divalproex  250 mg Oral BID  . docusate sodium  100 mg Oral Daily  . donepezil  10 mg Oral Daily  . enoxaparin (LOVENOX) injection  40 mg Subcutaneous Q24H  . levofloxacin  750 mg Oral Daily  . memantine  28 mg Oral Daily  . nortriptyline  25 mg Oral QHS  . pantoprazole  40 mg Oral Daily   Continuous Infusions: . sodium chloride 125 mL/hr at 01/15/15 0808   PRN Meds:acetaminophen **OR** acetaminophen, fentaNYL, HYDROcodone-acetaminophen, ondansetron **OR** ondansetron (ZOFRAN) IV, polyethylene glycol, temazepam, traMADol  Assessment/Plan:  1. Altered mental status/ Delirium  superimposed on dementia- improved today. Continue fluids 2. pneumonia-repeat CXR shows resolution of infiltrate. Continue Levaquin through Sunday. Speech Therapy eval as outpatient showed no evidence of aspiration. 3. Recurrent falls- continue PT/OT. May need SNF rehab. 4. Pain of right scapula/neck- unclear etiology.  XR not impressive 5. Hypertension- Continue home meds. 6. Disposition- anticipate discharge to home with 24 hour assistance vs. SNF in 2-3 days  7. Anemia: mild 8. Hyponatremia: resolved 9. Constipation: add some miralax  LOS: 2 days   Gonsalo Cuthbertson ALAN 01/15/2015, 10:26 AM

## 2015-01-16 LAB — URINE CULTURE

## 2015-01-16 LAB — BASIC METABOLIC PANEL
Anion gap: 8 (ref 5–15)
BUN: 7 mg/dL (ref 6–23)
CHLORIDE: 103 mmol/L (ref 96–112)
CO2: 26 mmol/L (ref 19–32)
CREATININE: 0.48 mg/dL — AB (ref 0.50–1.10)
Calcium: 8.5 mg/dL (ref 8.4–10.5)
GFR calc non Af Amer: 86 mL/min — ABNORMAL LOW (ref >90)
GLUCOSE: 108 mg/dL — AB (ref 70–99)
Potassium: 4.1 mmol/L (ref 3.5–5.1)
Sodium: 137 mmol/L (ref 135–145)

## 2015-01-16 LAB — CBC
HCT: 31.1 % — ABNORMAL LOW (ref 36.0–46.0)
Hemoglobin: 10.2 g/dL — ABNORMAL LOW (ref 12.0–15.0)
MCH: 30.9 pg (ref 26.0–34.0)
MCHC: 32.8 g/dL (ref 30.0–36.0)
MCV: 94.2 fL (ref 78.0–100.0)
Platelets: 201 10*3/uL (ref 150–400)
RBC: 3.3 MIL/uL — ABNORMAL LOW (ref 3.87–5.11)
RDW: 13.2 % (ref 11.5–15.5)
WBC: 7 10*3/uL (ref 4.0–10.5)

## 2015-01-16 MED ORDER — MAGNESIUM CITRATE PO SOLN
1.0000 | Freq: Every day | ORAL | Status: DC | PRN
  Administered 2015-01-16: 1 via ORAL
  Filled 2015-01-16 (×2): qty 296

## 2015-01-16 NOTE — Progress Notes (Signed)
Subjective: Somnolent after pain meds. Did wake up and eat breakfast earlier. No BM yet.   Objective: Vital signs in last 24 hours: Temp:  [97.5 F (36.4 C)-99.2 F (37.3 C)] 97.5 F (36.4 C) (01/24 0528) Pulse Rate:  [77-92] 77 (01/24 0528) Resp:  [15-18] 15 (01/24 0528) BP: (130-150)/(50-55) 138/50 mmHg (01/24 0528) SpO2:  [95 %-96 %] 95 % (01/24 0528)  Intake/Output from previous day: 01/23 0701 - 01/24 0700 In: 2376.7 [P.O.:960; I.V.:1416.7] Out: 3250 [Urine:3250] Intake/Output this shift: Total I/O In: 240 [P.O.:240] Out: 800 [Urine:800]  Lying peacefully, preference to keep head to the right. Lungs clear. Ht regular abd soft NT, no edema  Lab Results   Recent Labs  01/15/15 0332 01/16/15 0605  WBC 6.6 7.0  RBC 3.27* 3.30*  HGB 10.0* 10.2*  HCT 31.4* 31.1*  MCV 96.0 94.2  MCH 30.6 30.9  RDW 13.6 13.2  PLT 191 201    Recent Labs  01/15/15 0332 01/16/15 0605  NA 137 137  K 4.1 4.1  CL 104 103  CO2 23 26  GLUCOSE 99 108*  BUN 12 7  CREATININE 0.57 0.48*  CALCIUM 8.7 8.5    Studies/Results: No results found.  Scheduled Meds: . amLODipine  5 mg Oral Daily  . aspirin EC  81 mg Oral Daily  . atenolol  25 mg Oral BID  . calcium carbonate  1 tablet Oral Q breakfast  . divalproex  250 mg Oral BID  . docusate sodium  100 mg Oral Daily  . donepezil  10 mg Oral Daily  . enoxaparin (LOVENOX) injection  40 mg Subcutaneous Q24H  . levofloxacin  750 mg Oral Daily  . memantine  28 mg Oral Daily  . nortriptyline  25 mg Oral QHS  . pantoprazole  40 mg Oral Daily  . polyethylene glycol  17 g Oral Daily   Continuous Infusions: . sodium chloride 125 mL/hr at 01/16/15 0846   PRN Meds:acetaminophen **OR** acetaminophen, fentaNYL, HYDROcodone-acetaminophen, ondansetron **OR** ondansetron (ZOFRAN) IV, polyethylene glycol, temazepam, traMADol  Assessment/Plan:  1. Altered mental status/ Delirium superimposed on dementia- improved in general Continue  fluids 2. pneumonia-repeat CXR shows resolution of infiltrate. Continue Levaquin through [facility] Speech Therapy eval as outpatient showed no evidence of aspiration. 3. Recurrent falls- continue PT/OT. May need SNF rehab. 4. Pain of right scapula/neck- unclear etiology. XR not impressive 5. Hypertension- Continue home meds. 6. Disposition- anticipate discharge to home with 24 hour assistance vs. SNF perhaps the beginning of the week 7. Anemia: mild 8. Hyponatremia: resolved 9. Constipation: add some miralax, mag citrate  LOS: 3 days   [name] ALAN 01/16/2015, 10:24 AM

## 2015-01-17 LAB — BASIC METABOLIC PANEL
Anion gap: 10 (ref 5–15)
BUN: 10 mg/dL (ref 6–23)
CALCIUM: 8.6 mg/dL (ref 8.4–10.5)
CO2: 25 mmol/L (ref 19–32)
CREATININE: 0.46 mg/dL — AB (ref 0.50–1.10)
Chloride: 104 mmol/L (ref 96–112)
GFR, EST NON AFRICAN AMERICAN: 88 mL/min — AB (ref >90)
Glucose, Bld: 115 mg/dL — ABNORMAL HIGH (ref 70–99)
Potassium: 4.3 mmol/L (ref 3.5–5.1)
Sodium: 139 mmol/L (ref 135–145)

## 2015-01-17 LAB — CBC
HEMATOCRIT: 31.4 % — AB (ref 36.0–46.0)
Hemoglobin: 10.2 g/dL — ABNORMAL LOW (ref 12.0–15.0)
MCH: 30.7 pg (ref 26.0–34.0)
MCHC: 32.5 g/dL (ref 30.0–36.0)
MCV: 94.6 fL (ref 78.0–100.0)
Platelets: 220 10*3/uL (ref 150–400)
RBC: 3.32 MIL/uL — ABNORMAL LOW (ref 3.87–5.11)
RDW: 13.3 % (ref 11.5–15.5)
WBC: 7.5 10*3/uL (ref 4.0–10.5)

## 2015-01-17 MED ORDER — MAGNESIUM CITRATE PO SOLN
1.0000 | Freq: Once | ORAL | Status: AC
  Administered 2015-01-17: 1 via ORAL
  Filled 2015-01-17: qty 296

## 2015-01-17 MED ORDER — ACETAMINOPHEN 325 MG PO TABS: 650.0000 mg | ORAL_TABLET | Freq: Four times a day (QID) | ORAL | Status: AC | PRN

## 2015-01-17 MED ORDER — DIVALPROEX SODIUM 250 MG PO DR TAB: 250.0000 mg | DELAYED_RELEASE_TABLET | Freq: Two times a day (BID) | ORAL | Status: DC

## 2015-01-17 MED ORDER — SORBITOL 70 % SOLN
960.0000 mL | TOPICAL_OIL | Freq: Once | ORAL | Status: AC
  Administered 2015-01-17: 960 mL via RECTAL
  Filled 2015-01-17: qty 240

## 2015-01-17 MED ORDER — FLEET ENEMA 7-19 GM/118ML RE ENEM
1.0000 | ENEMA | Freq: Once | RECTAL | Status: AC
  Administered 2015-01-17: 1 via RECTAL
  Filled 2015-01-17: qty 1

## 2015-01-17 NOTE — Discharge Instructions (Signed)
Confusion Confusion is the inability to think with your usual speed or clarity. Confusion may come on quickly or slowly over time. How quickly the confusion comes on depends on the cause. Confusion can be due to any number of causes. CAUSES   Concussion, head injury, or head trauma.  Seizures.  Stroke.  Fever.  Brain tumor.  Age related decreased brain function (dementia).  Heightened emotional states like rage or terror.  Mental illness in which the person loses the ability to determine what is real and what is not (hallucinations).  Infections such as a urinary tract infection (UTI).  Toxic effects from alcohol, drugs, or prescription medicines.  Dehydration and an imbalance of salts in the body (electrolytes).  Lack of sleep.  Low blood sugar (diabetes).  Low levels of oxygen from conditions such as chronic lung disorders.  Drug interactions or other medicine side effects.  Nutritional deficiencies, especially niacin, thiamine, vitamin C, or vitamin B.  Sudden drop in body temperature (hypothermia).  Change in routine, such as when traveling or hospitalized. SIGNS AND SYMPTOMS  People often describe their thinking as cloudy or unclear when they are confused. Confusion can also include feeling disoriented. That means you are unaware of where or who you are. You may also not know what the date or time is. If confused, you may also have difficulty paying attention, remembering, and making decisions. Some people also act aggressively when they are confused.  DIAGNOSIS  The medical evaluation of confusion may include:  Blood and urine tests.  X-rays.  Brain and nervous system tests.  Analyzing your brain waves (electroencephalogram or EEG).  Magnetic resonance imaging (MRI) of your head.  Computed tomography (CT) scan of your head.  Mental status tests in which your health care provider may ask many questions. Some of these questions may seem silly or strange,  but they are a very important test to help diagnose and treat confusion. TREATMENT  An admission to the hospital may not be needed, but a person with confusion should not be left alone. Stay with a family member or friend until the confusion clears. Avoid alcohol, pain relievers, or sedative drugs until you have fully recovered. Do not drive until directed by your health care provider. HOME CARE INSTRUCTIONS  What family and friends can do:  To find out if someone is confused, ask the person to state his or her name, age, and the date. If the person is unsure or answers incorrectly, he or she is confused.  Always introduce yourself, no matter how well the person knows you.  Often remind the person of his or her location.  Place a calendar and clock near the confused person.  Help the person with his or her medicines. You may want to use a pill box, an alarm as a reminder, or give the person each dose as prescribed.  Talk about current events and plans for the day.  Try to keep the environment calm, quiet, and peaceful.  Make sure the person keeps follow-up visits with his or her health care provider. PREVENTION  Ways to prevent confusion:  Avoid alcohol.  Eat a balanced diet.  Get enough sleep.  Take medicine only as directed by your health care provider.  Do not become isolated. Spend time with other people and make plans for your days.  Keep careful watch on your blood sugar levels if you are diabetic. SEEK IMMEDIATE MEDICAL CARE IF:   You develop severe headaches, repeated vomiting, seizures, blackouts, or   slurred speech.  There is increasing confusion, weakness, numbness, restlessness, or personality changes.  You develop a loss of balance, have marked dizziness, feel uncoordinated, or fall.  You have delusions, hallucinations, or develop severe anxiety.  Your family members think you need to be rechecked. Document Released: 01/17/2005 Document Revised: 04/26/2014  Document Reviewed: 01/15/2014 [employer] Patient Information 2015 [employer]. This information is not intended to replace advice given to you by your health care provider. Make sure you discuss any questions you have with your health care provider.  

## 2015-01-17 NOTE — Progress Notes (Signed)
Physical Therapy Treatment Patient Details Name: [name] MRN: [id number] DOB: 06-07-1926 Today's Date: 01/17/2015    History of Present Illness Pt has been more lethargic for the past 2 weeks.  Had to start feeding her on Sunday.  Pain in right shoulder and middle of back started last week.  Pain worse when moving. She was seen by Avanell Shackleton, NP on 1/18 with increased lethargy, cough, weakness.  Chest x-ray done at [facility] showed right lower lobe infiltrate.  Note history of several falls.     PT Comments    Patient very limited this session. Patient unable to state name or where she is. Patient unable to follow commands and unable to assist into sitting. Required +2 total A to sit EOB and required total assist to sit upright. Patient is planning to DC home today with assistance from family and caregivers. Continue to recommend use of lift and use of hospital bed at home to decrease burden of care on family.   Follow Up Recommendations  Home health PT;SNF;Supervision/Assistance - 24 hour     Equipment Recommendations  Wheelchair (measurements PT);Wheelchair cushion (measurements PT);Hospital bed;Other (comment) (mechanical lift if home is planned)    Recommendations for Other Services       Precautions / Restrictions Precautions Precautions: Fall Precaution Comments: weakness and pain in R shoulder/scapular region from previous fall, hx of Alzheimer's    Mobility  Bed Mobility Overal bed mobility: Needs Assistance Bed Mobility: Supine to Sit     Supine to sit: Total assist;+2 for physical assistance Sit to supine: +2 for physical assistance;Total assist   General bed mobility comments: A with all aspects of transfer. Attempted to initiate LE movementy but patient with very limited assistance before crying out in pain  Transfers                 General transfer comment: unable this session  Ambulation/Gait                 Stairs             Wheelchair Mobility    Modified Rankin (Stroke Patients Only)       Balance     Sitting balance-Leahy Scale: Zero Sitting balance - Comments: When attempting to sit EOB, patient with heavy R side lean, wanting to lay back onto side. Would not sit up on her own                            Cognition Arousal/Alertness: Awake/alert Behavior During Therapy: Flat affect Overall Cognitive Status: Impaired/Different from baseline (Patient with history of Alzhiemers. ) Area of Impairment: Attention;Following commands   Current Attention Level: Focused (at best)   Following Commands: Follows one step commands inconsistently       General Comments: Unsure of exact baseline, no family present    Exercises      General Comments        Pertinent Vitals/Pain Pain Assessment: Faces Pain Score: 6  Pain Location: Patietn could not stated where but when attempting to transfer EOB and elevate trunk patient would cry out in pain    Home Living                      Prior Function            PT Goals (current goals can now be found in the care plan section) Progress towards PT goals:  Not progressing toward goals - comment    Frequency  Min 2X/week    PT Plan Current plan remains appropriate    Co-evaluation             End of Session   Activity Tolerance: Other (comment);Patient limited by pain (Cognitive history)       Time: 9211-9417 PT Time Calculation (min) (ACUTE ONLY): 17 min  Charges:  $Therapeutic Activity: 8-22 mins                    G Codes:      Jacqualyn Posey 01/17/2015, 9:33 AM 01/17/2015 Jacqualyn Posey PTA [phone] pager [phone] office

## 2015-01-17 NOTE — Clinical Social Work Note (Signed)
CSW received referral for SNF.  Case discussed with case manager, and plan is to discharge home.  CSW to sign off please re-consult if social work needs arise.  Jaymie Misch R. Leonette Tischer, MSW, LCSWA [phone]  

## 2015-01-17 NOTE — Progress Notes (Signed)
SMOG enema given with sml results(pt unable to hold solution). Large hard  lmpaction removed at this time.

## 2015-01-17 NOTE — Discharge Summary (Signed)
DISCHARGE SUMMARY  Darlene CHARRIER  MR#: [id number]  DOB:[birth year]  Date of Admission: 01/13/2015 Date of Discharge: 01/17/2015  Attending Physician:Dystany Duffy, Gwyndolyn Saxon  Patient's CNO:BSJG, Gwyndolyn Saxon, MD  Consults:  None  Discharge Diagnoses:   Delirium superimposed on dementia   Pneumonia   Altered mental status   Recurrent falls   Pain of right scapula/shoulder   Hypertension    Past Medical History HTN Hyperlipidemia Dementia, AD (MMSE 26/30 in 11/11-->23/30 in 1/13--> 26/30 in 1/14-->17/30 in 1/15)- enrolled in Dementia study Bladder cancer s/p cystectomy/ureterostomy (2002) SVT Vitamin D deficiency Carotid stenosis (left 40-59%, right 20-39%- 10/10)- followed by Dr. Donnetta Hutching Osteopenia Syncope Anxiety Asthma Cerical disc dz Pyelonephritis LBBB Ventricular tachycardia CAD- nonobstructive (10/08)- 40% LAD PVCs Headaches Cervical spondylosis Heartburn Hydronephrosis Hypoglycemia Rectocele Microscopic hematuria  Past Surgical History  Procedure Laterality Date  . Total abdominal hysterectomy w/ bilateral salpingoophorectomy    . Hernia repair    . Cystectomy    . Ureterostomy    . Appendectom    . Cardiac catheterization    . Left Wrist Fracture surgery    . Abdominal hysterectomy      Discharge Medications:   Medication List    STOP taking these medications        temazepam 15 MG capsule  Commonly known as:  RESTORIL     zolpidem 12.5 MG CR tablet  Commonly known as:  AMBIEN CR      TAKE these medications        acetaminophen 325 MG tablet  Commonly known as:  TYLENOL  Take 2 tablets (650 mg total) by mouth every 6 (six) hours as needed for mild pain (or Fever >/= 101).     amLODipine 5 MG tablet  Commonly known as:  NORVASC  Take 5 mg by mouth daily.     aspirin EC 81 MG tablet  Take 81 mg by mouth daily.     atenolol 25 MG tablet  Commonly known as:  TENORMIN  Take 25 mg by mouth 2 (two) times daily.     CALTRATE 600 PO  Take 1  tablet by mouth daily.     cetirizine 10 MG tablet  Commonly known as:  ZYRTEC  Take 10 mg by mouth daily.     divalproex 250 MG DR tablet  Commonly known as:  DEPAKOTE  Take 1 tablet (250 mg total) by mouth 2 (two) times daily.     donepezil 10 MG tablet  Commonly known as:  ARICEPT  Take 10 mg by mouth daily.     multivitamin capsule  Take 1 capsule by mouth daily.     NAMENDA XR 28 MG Cp24 24 hr capsule  Generic drug:  memantine  Take 28 mg by mouth daily.     Omeprazole-Sodium Bicarbonate 20-1100 MG Caps capsule  Commonly known as:  ZEGERID  Take 1 capsule by mouth 2 (two) times daily.     rosuvastatin 10 MG tablet  Commonly known as:  CRESTOR  Take 10 mg by mouth every Monday, Wednesday, and Friday.     STOOL SOFTENER PO  Take 1 capsule by mouth daily as needed (constipation).     traMADol 50 MG tablet  Commonly known as:  ULTRAM  Take 25 mg by mouth every 12 (twelve) hours as needed for moderate pain.        Hospital Procedures: X-ray Chest Pa And Lateral  01/13/2015   CLINICAL DATA:  81 year old with altered mental status and pneumonia.  EXAM: CHEST  2 VIEW  COMPARISON:  01/26/2014  FINDINGS: Evidence for scarring along the lung apices. Otherwise, the lungs are clear. Heart and mediastinum are within normal limits. No evidence for pleural effusions. No acute bone abnormality.  IMPRESSION: No active cardiopulmonary disease.   Electronically Signed   By: Markus Daft M.D.   On: 01/13/2015 14:37   Dg Cervical Spine 2 Or 3 Views  01/14/2015   CLINICAL DATA:  Patient with diffuse neck and chest pain.  EXAM: CERVICAL SPINE - 2-3 VIEW  COMPARISON:  None.  FINDINGS: Straightening of the normal cervical lordosis with more focal kyphosis at the C4-5 level. Osseous fusion of the C5-6 vertebral level. Multilevel degenerative change with disc space narrowing and anterior endplate osteophytosis most pronounced at C4-5 and C6-7. Nondiagnostic odontoid views as the dens is not able  to be visualized. Markedly limited AP views given positioning and non inclusion of the entire cervical spine. Carotid arterial vascular calcifications. Apical pleural parenchymal thickening. Prevertebral soft tissues unremarkable.  IMPRESSION: Limited examination secondary to patient positioning.  Multilevel degenerative changes of the cervical spine with osseous fusion of the C5-6 levels.  Focal kyphosis at the C4-5 level.  Carotid arterial vascular calcifications.   Electronically Signed   By: Lovey Newcomer M.D.   On: 01/14/2015 10:06   Dg Thoracic Spine 2 View  01/13/2015   CLINICAL DATA:  Acute middle back pain for 1 week.  EXAM: THORACIC SPINE - 2 VIEW  COMPARISON:  January 26, 2014.  FINDINGS: No fracture or spondylolisthesis is noted. Moderate dextroscoliosis of thoracolumbar junction is noted. Minimal degenerative changes are noted in the lower thoracic spine.  IMPRESSION: No acute abnormality seen in the thoracic spine.   Electronically Signed   By: Sabino Dick M.D.   On: 01/13/2015 14:25   Dg Shoulder Right  01/13/2015   CLINICAL DATA:  Acute right shoulder pain for 1 week.  EXAM: RIGHT SHOULDER - 2+ VIEW  COMPARISON:  None.  FINDINGS: There is no evidence of fracture or dislocation. Mild degenerative changes seen involving the right acromioclavicular joint. Visualized ribs appear in. Soft tissues are unremarkable.  IMPRESSION: Mild degenerative change of the right acromioclavicular joint. No acute abnormality seen in the right shoulder.   Electronically Signed   By: Sabino Dick M.D.   On: 01/13/2015 14:21   Ct Head Wo Contrast  01/13/2015   CLINICAL DATA:  Altered mental status.  EXAM: CT HEAD WITHOUT CONTRAST  TECHNIQUE: Contiguous axial images were obtained from the base of the skull through the vertex without intravenous contrast.  COMPARISON:  CT scan of July 21, 2008.  FINDINGS: Bony calvarium appears intact. Mild diffuse cortical atrophy is noted. Mild chronic ischemic white matter disease  is noted. Ventricular size is within normal limits. Old bilateral basal ganglia lacunar infarctions are noted. No mass lesion or hemorrhage is noted. New low density is noted inferiorly in the left cerebellar hemisphere concerning for acute infarction. No midline shift is noted.  IMPRESSION: New low density is noted inferiorly in left cerebellar hemisphere concerning for acute infarction. MRI may be performed for further evaluation. These results will be called to the ordering clinician or representative by the Radiologist Assistant, and communication documented in the PACS or zVision Dashboard.   Electronically Signed   By: Sabino Dick M.D.   On: 01/13/2015 14:53   Mr Virgel Paling Wo Contrast  01/13/2015   CLINICAL DATA:  81 year old female with increasing lethargy over the past 2 weeks. Dementia. Multiple falls.  Hit head 12/17/2014. No loss of consciousness. Initial encounter.  EXAM: MRI HEAD WITHOUT AND WITH CONTRAST  MRA HEAD WITHOUT CONTRAST  TECHNIQUE: Multiplanar, multiecho pulse sequences of the brain and surrounding structures were obtained without and with intravenous contrast. Angiographic images of the head were obtained using MRA technique without contrast.  CONTRAST:  15 cc MultiHance  COMPARISON:  01/13/2015 CT.  06/15/2013 and 12/03/2003 MR.  FINDINGS: MRI HEAD FINDINGS  Exam is motion degraded.  No acute infarct.  No intracranial hemorrhage.  Mild small vessel disease type changes.  Bilateral globus pallidus abnormality unchanged since 2004. This may reflect prominent perivascular spaces as there is no gliosis in this region.  No intracranial mass or abnormal enhancement.  Atrophy. Ventricular prominence slightly more notable than on the 2014 MR. I suspect this is related to central atrophy although would be difficult to completely exclude a mild component of hydrocephalus.  Partial opacification left mastoid air cells.  Mild cervical spondylotic changes and spinal stenosis C4-5 and less so C3-4.   Cervical medullary junction unremarkable. Partially empty sella incidentally detected. Pineal region and orbital structures unremarkable.  MRA HEAD FINDINGS  Moderate narrowing portions of the A1 and A2 segment of the left anterior cerebral artery.  Middle cerebral artery moderate narrowing and irregularity bilaterally.  Fetal type contribution to the right posterior cerebral artery.  Mild narrowing right vertebral artery.  Nonvisualized left posterior inferior cerebellar artery and right anterior inferior cerebellar artery.  Posterior cerebral artery distal branch vessel irregularity bilaterally with mild narrowing proximal left posterior cerebral artery.  No aneurysm noted.  IMPRESSION: MRI HEAD  Exam is motion degraded.  No acute infarct.  Mild small vessel disease type changes.  Bilateral globus pallidus abnormality unchanged since 2004. This may reflect prominent perivascular spaces.  No intracranial mass or abnormal enhancement.  Atrophy. Ventricular prominence slightly more notable than on the 2014 MR. I suspect this is related to central atrophy although would be difficult to completely exclude a mild component of hydrocephalus.  Partial opacification left mastoid air cells.  Mild cervical spondylotic changes and spinal stenosis C4-5 and less so C3-4.  MRA HEAD FINDINGS  Branch vessel atherosclerotic type changes as noted above.   Electronically Signed   By: Chauncey Cruel M.D.   On: 01/13/2015 18:39   Mr Jeri Cos QQ Contrast  01/13/2015   CLINICAL DATA:  81 year old female with increasing lethargy over the past 2 weeks. Dementia. Multiple falls. Hit head 12/17/2014. No loss of consciousness. Initial encounter.  EXAM: MRI HEAD WITHOUT AND WITH CONTRAST  MRA HEAD WITHOUT CONTRAST  TECHNIQUE: Multiplanar, multiecho pulse sequences of the brain and surrounding structures were obtained without and with intravenous contrast. Angiographic images of the head were obtained using MRA technique without contrast.   CONTRAST:  15 cc MultiHance  COMPARISON:  01/13/2015 CT.  06/15/2013 and 12/03/2003 MR.  FINDINGS: MRI HEAD FINDINGS  Exam is motion degraded.  No acute infarct.  No intracranial hemorrhage.  Mild small vessel disease type changes.  Bilateral globus pallidus abnormality unchanged since 2004. This may reflect prominent perivascular spaces as there is no gliosis in this region.  No intracranial mass or abnormal enhancement.  Atrophy. Ventricular prominence slightly more notable than on the 2014 MR. I suspect this is related to central atrophy although would be difficult to completely exclude a mild component of hydrocephalus.  Partial opacification left mastoid air cells.  Mild cervical spondylotic changes and spinal stenosis C4-5 and less so C3-4.  Cervical medullary junction  unremarkable. Partially empty sella incidentally detected. Pineal region and orbital structures unremarkable.  MRA HEAD FINDINGS  Moderate narrowing portions of the A1 and A2 segment of the left anterior cerebral artery.  Middle cerebral artery moderate narrowing and irregularity bilaterally.  Fetal type contribution to the right posterior cerebral artery.  Mild narrowing right vertebral artery.  Nonvisualized left posterior inferior cerebellar artery and right anterior inferior cerebellar artery.  Posterior cerebral artery distal branch vessel irregularity bilaterally with mild narrowing proximal left posterior cerebral artery.  No aneurysm noted.  IMPRESSION: MRI HEAD  Exam is motion degraded.  No acute infarct.  Mild small vessel disease type changes.  Bilateral globus pallidus abnormality unchanged since 2004. This may reflect prominent perivascular spaces.  No intracranial mass or abnormal enhancement.  Atrophy. Ventricular prominence slightly more notable than on the 2014 MR. I suspect this is related to central atrophy although would be difficult to completely exclude a mild component of hydrocephalus.  Partial opacification left mastoid  air cells.  Mild cervical spondylotic changes and spinal stenosis C4-5 and less so C3-4.  MRA HEAD FINDINGS  Branch vessel atherosclerotic type changes as noted above.   Electronically Signed   By: Bridgett Larsson M.D.   On: 01/13/2015 18:39    History of Present Illness: [name] is back with her husband and caregiver today for reevaluation.  More lethargic for the past 2 weeks. Had to start feeding her on Sunday. Pain in right shoulder and middle of back started last week. Pain worse when moving. Taking Tramadol every 6 hours and Ibuprofen. She was seen by Georga Hacking, NP on 1/18 with increased lethargy, cough, weakness. Chest x-ray done at [facility] showed right lower lobe infiltrate. 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 Eating Ok. Not drinking well. Seems to be holding fluids and pills in her mouth. Cough is getting better.   Dementia is progressing. Increased Depakote to tid in 8/15 due to increased sundowning and agitation.   Multiple falls recently- has not hit head since Christmas when she fell back on back of her head. No loss of consciousness. No known injuries. Generally just slumps to the ground when she falls. Has not been walking at all for the past week.   Hospital Course: Yelena was admitted to a medical bed. She was hydrated with IV fluids due to volume depletion/dehydration. She was continued on Levaquin for pneumonia coverage. Repeat chest x-ray showed resolution of pneumonia. Due to her altered mental status and increased confusion a head CT was performed that showed possible cerebellar stroke. An MRI/MRA was done for further evaluation which showed no acute infarct or abnormality. X-rays of her thoracic spine, neck, and right shoulder showed no evidence of fracture or acute abnormality. She continued to have right upper back/shoulder pain with movement but this seemed to have improved during his hospitalization. This was treated with Tylenol and  infrequent use of tramadol and Vicodin. She did have increased somnolence after her pain medications but overall her mental status improved and is now approaching baseline. She has 24 hour caregivers that have been present throughout her hospitalization. Her caregiver feels that her mental status is clearing with significant improvement since admission. She is eating well with some assistance and able to ambulate with assistance. She was evaluated by physical therapy/occupational therapy who recommended 24-hour care versus skilled nursing facility rehabilitation. With the 24-hour care that she has at home her husband prefers discharge home. At this time, given improvement in her mental status and  overall functional status she is being discharged home. Will continue physical therapy/occupational therapy at home. Of note, we have decreased her Depakote to twice a day and are going to try to discontinue her Restoril and Ambien. We'll use tramadol sparingly to avoid sedating medications.  Day of Discharge Exam BP 142/52 mmHg  Pulse 82  Temp(Src) 97.7 F (36.5 C) (Axillary)  Resp 16  Ht $R'5\' 6"'zb$  (1.676 m)  Wt 69.627 kg (153 lb 8 oz)  BMI 24.79 kg/m2  SpO2 95%  Physical Exam: General appearance: alert and disoriented Eyes: no scleral icterus Throat: oropharynx moist without erythema Resp: clear to auscultation bilaterally Cardio: regular rate and rhythm and grade 3/6 SEM GI: soft, non-tender; bowel sounds normal; no masses,  no organomegaly Extremities: no clubbing, cyanosis or edema  Discharge Labs:  Recent Labs  01/16/15 0605 01/17/15 0604  NA 137 139  K 4.1 4.3  CL 103 104  CO2 26 25  GLUCOSE 108* 115*  BUN 7 10  CREATININE 0.48* 0.46*  CALCIUM 8.5 8.6   Hepatic Function Latest Ref Rng 01/13/2015 01/26/2014 01/25/2014  Total Protein 6.0 - 8.3 g/dL 6.5 5.8(L) 6.4  Albumin 3.5 - 5.2 g/dL 2.7(L) 2.9(L) 3.2(L)  AST 0 - 37 U/L $Remo'26 18 28  'kKuTz$ ALT 0 - 35 U/L 41(H) 12 13  Alk Phosphatase 39 - 117  U/L 65 50 58  Total Bilirubin 0.3 - 1.2 mg/dL 0.5 <0.2(L) <0.2(L)     Recent Labs  01/16/15 0605 01/17/15 0604  WBC 7.0 7.5  HGB 10.2* 10.2*  HCT 31.1* 31.4*  MCV 94.2 94.6  PLT 201 220   Lab Results  Component Value Date   INR 1.01 08/02/2013   INR 1.2 07/22/2008   INR 1.0 06/06/2008    Discharge instructions:     Discharge Instructions    Diet general    Complete by:  As directed      Discharge instructions    Complete by:  As directed   Fall precautions- walk with assistance. Avoid sedating medications including Ambien, Restoril and use Tramadol sparingly due to risk of excessive sedation.  Use heat, stretching and PT for shoulder pain.     Increase activity slowly    Complete by:  As directed            Disposition: to home with 24 hour caregivers and husband.  Home Health PT/OT ordered  Follow-up Appts: Follow-up with Dr. Brigitte Pulse at [facility] in 1 week.  Our office will call for TOC visit.  Condition on Discharge: improved, approaching baseline  Tests Needing Follow-up: None  Time with discharge activities: 35 minutes  Signed: Marton Redwood 01/17/2015, 7:32 AM

## 2015-01-17 NOTE — Progress Notes (Signed)
Pt continues to have loose incontinent stools from previous enemas and bowel preps

## 2015-01-18 LAB — BASIC METABOLIC PANEL
ANION GAP: 6 (ref 5–15)
BUN: 11 mg/dL (ref 6–23)
CALCIUM: 8.9 mg/dL (ref 8.4–10.5)
CHLORIDE: 104 mmol/L (ref 96–112)
CO2: 30 mmol/L (ref 19–32)
Creatinine, Ser: 0.53 mg/dL (ref 0.50–1.10)
GFR, EST NON AFRICAN AMERICAN: 84 mL/min — AB (ref >90)
Glucose, Bld: 118 mg/dL — ABNORMAL HIGH (ref 70–99)
Potassium: 3.8 mmol/L (ref 3.5–5.1)
SODIUM: 140 mmol/L (ref 135–145)

## 2015-01-18 LAB — CBC
HCT: 32.5 % — ABNORMAL LOW (ref 36.0–46.0)
Hemoglobin: 10.6 g/dL — ABNORMAL LOW (ref 12.0–15.0)
MCH: 30.9 pg (ref 26.0–34.0)
MCHC: 32.6 g/dL (ref 30.0–36.0)
MCV: 94.8 fL (ref 78.0–100.0)
PLATELETS: 227 10*3/uL (ref 150–400)
RBC: 3.43 MIL/uL — ABNORMAL LOW (ref 3.87–5.11)
RDW: 13.5 % (ref 11.5–15.5)
WBC: 8.1 10*3/uL (ref 4.0–10.5)

## 2015-01-18 MED ORDER — HYDROCORTISONE 2.5 % RE CREA
TOPICAL_CREAM | Freq: Two times a day (BID) | RECTAL | Status: DC
  Administered 2015-01-18: 10:00:00 via RECTAL
  Filled 2015-01-18: qty 28.35

## 2015-01-18 MED ORDER — NITROFURANTOIN MACROCRYSTAL 100 MG PO CAPS: 100.0000 mg | ORAL_CAPSULE | Freq: Two times a day (BID) | ORAL | Status: DC

## 2015-01-18 MED ORDER — POLYETHYLENE GLYCOL 3350 17 G PO PACK: 17.0000 g | PACK | Freq: Every day | ORAL | Status: AC | PRN

## 2015-01-18 MED ORDER — NITROFURANTOIN MACROCRYSTAL 100 MG PO CAPS
100.0000 mg | ORAL_CAPSULE | Freq: Two times a day (BID) | ORAL | Status: DC
  Administered 2015-01-18: 100 mg via ORAL
  Filled 2015-01-18 (×2): qty 1

## 2015-01-18 MED ORDER — HYDROCORTISONE 2.5 % RE CREA: TOPICAL_CREAM | Freq: Two times a day (BID) | RECTAL | Status: AC

## 2015-01-18 NOTE — Clinical Social Work Note (Signed)
CSW received consult to see patient, because patient and family would like to go to a SNF instead of home health.  Met with patient and family to discuss SNF placements, patient and husband decided they will go ahead and take her home with home health PT, OT, and SW to possibly consider placement from the community.  Patient and family informed this CSW that they do not want PT to come see her now today.  Patient's family and caregiver stated they will take her home and will have caregivers stay with her.  CSW to sign off please re-consult if social work needs arise.  Jones Broom. [place], MSW, [place]

## 2015-01-18 NOTE — Progress Notes (Signed)
Pt's family has spoken with LCSW and has elected to go home with [facility] at this time. I spoke with Dr. Brigitte Pulse and received order to add [place] for the patient so that they can assist patient's family with SNF placement from the community if needed.  Sandi Mariscal, RN BSN MHA CCM  Case Manager, Trauma Service/Unit [facility] [phone]

## 2015-01-18 NOTE — Discharge Summary (Signed)
DISCHARGE SUMMARY  [name]  MR#: [id number]  DOB:[birth year]  Date of Admission: 01/13/2015 Date of Discharge: 01/18/2015  Attending Physician:Shaw, Gwyndolyn Saxon  Patient's DDU:KGUR, Gwyndolyn Saxon, MD  Consults:  None  Discharge Diagnoses:  Delirium superimposed on dementia   Urinary Tract Infection, E. coli  Pneumonia  Altered mental status  Recurrent falls  Pain of right scapula/shoulder  Hypertension   Past Medical History HTN Hyperlipidemia Dementia, AD (MMSE 26/30 in 11/11-->23/30 in 1/13--> 26/30 in 1/14-->17/30 in 1/15)- enrolled in Dementia study Bladder cancer s/p cystectomy/ureterostomy (2002) SVT Vitamin D deficiency Carotid stenosis (left 40-59%, right 20-39%- 10/10)- followed by Dr. Donnetta Hutching Osteopenia Syncope Anxiety Asthma Cerical disc dz Pyelonephritis LBBB Ventricular tachycardia CAD- nonobstructive (10/08)- 40% LAD PVCs Headaches Cervical spondylosis Heartburn Hydronephrosis Hypoglycemia Rectocele Microscopic hematuria  Past Surgical History  Procedure Laterality Date  . Total abdominal hysterectomy w/ bilateral salpingoophorectomy    . Hernia repair    . Cystectomy    . Ureterostomy    . Appendectom    . Cardiac catheterization    . Left Wrist Fracture surgery    . Abdominal hysterectomy      Discharge Medications:   Medication List    STOP taking these medications        temazepam 15 MG capsule  Commonly known as:  RESTORIL     zolpidem 12.5 MG CR tablet  Commonly known as:  AMBIEN CR      TAKE these medications        acetaminophen 325 MG tablet  Commonly known as:  TYLENOL  Take 2 tablets (650 mg total) by mouth every 6 (six) hours as needed for mild pain (or Fever >/= 101).     amLODipine 5 MG tablet  Commonly known as:  NORVASC  Take 5 mg by mouth daily.     aspirin EC 81 MG tablet  Take 81 mg by mouth daily.     atenolol 25 MG tablet  Commonly known as:  TENORMIN   Take 25 mg by mouth 2 (two) times daily.     CALTRATE 600 PO  Take 1 tablet by mouth daily.     cetirizine 10 MG tablet  Commonly known as:  ZYRTEC  Take 10 mg by mouth daily.     divalproex 250 MG DR tablet  Commonly known as:  DEPAKOTE  Take 1 tablet (250 mg total) by mouth 2 (two) times daily.     donepezil 10 MG tablet  Commonly known as:  ARICEPT  Take 10 mg by mouth daily.     hydrocortisone 2.5 % rectal cream  Commonly known as:  ANUSOL-HC  Place rectally 2 (two) times daily.     multivitamin capsule  Take 1 capsule by mouth daily.     NAMENDA XR 28 MG Cp24 24 hr capsule  Generic drug:  memantine  Take 28 mg by mouth daily.     nitrofurantoin 100 MG capsule  Commonly known as:  MACRODANTIN  Take 1 capsule (100 mg total) by mouth every 12 (twelve) hours.     Omeprazole-Sodium Bicarbonate 20-1100 MG Caps capsule  Commonly known as:  ZEGERID  Take 1 capsule by mouth 2 (two) times daily.     polyethylene glycol packet  Commonly known as:  MIRALAX / GLYCOLAX  Take 17 g by mouth daily as needed for mild constipation.     rosuvastatin 10 MG tablet  Commonly known as:  CRESTOR  Take 10 mg by mouth every Monday, Wednesday, and Friday.  STOOL SOFTENER PO  Take 1 capsule by mouth daily as needed (constipation).     traMADol 50 MG tablet  Commonly known as:  ULTRAM  Take 25 mg by mouth every 12 (twelve) hours as needed for moderate pain.        Hospital Procedures: X-ray Chest Pa And Lateral  01/13/2015   CLINICAL DATA:  81 year old with altered mental status and pneumonia.  EXAM: CHEST  2 VIEW  COMPARISON:  01/26/2014  FINDINGS: Evidence for scarring along the lung apices. Otherwise, the lungs are clear. Heart and mediastinum are within normal limits. No evidence for pleural effusions. No acute bone abnormality.  IMPRESSION: No active cardiopulmonary disease.   Electronically Signed   By: Markus Daft M.D.   On: 01/13/2015 14:37   Dg Cervical Spine 2 Or 3  Views  01/14/2015   CLINICAL DATA:  Patient with diffuse neck and chest pain.  EXAM: CERVICAL SPINE - 2-3 VIEW  COMPARISON:  None.  FINDINGS: Straightening of the normal cervical lordosis with more focal kyphosis at the C4-5 level. Osseous fusion of the C5-6 vertebral level. Multilevel degenerative change with disc space narrowing and anterior endplate osteophytosis most pronounced at C4-5 and C6-7. Nondiagnostic odontoid views as the dens is not able to be visualized. Markedly limited AP views given positioning and non inclusion of the entire cervical spine. Carotid arterial vascular calcifications. Apical pleural parenchymal thickening. Prevertebral soft tissues unremarkable.  IMPRESSION: Limited examination secondary to patient positioning.  Multilevel degenerative changes of the cervical spine with osseous fusion of the C5-6 levels.  Focal kyphosis at the C4-5 level.  Carotid arterial vascular calcifications.   Electronically Signed   By: Lovey Newcomer M.D.   On: 01/14/2015 10:06   Dg Thoracic Spine 2 View  01/13/2015   CLINICAL DATA:  Acute middle back pain for 1 week.  EXAM: THORACIC SPINE - 2 VIEW  COMPARISON:  January 26, 2014.  FINDINGS: No fracture or spondylolisthesis is noted. Moderate dextroscoliosis of thoracolumbar junction is noted. Minimal degenerative changes are noted in the lower thoracic spine.  IMPRESSION: No acute abnormality seen in the thoracic spine.   Electronically Signed   By: Sabino Dick M.D.   On: 01/13/2015 14:25   Dg Shoulder Right  01/13/2015   CLINICAL DATA:  Acute right shoulder pain for 1 week.  EXAM: RIGHT SHOULDER - 2+ VIEW  COMPARISON:  None.  FINDINGS: There is no evidence of fracture or dislocation. Mild degenerative changes seen involving the right acromioclavicular joint. Visualized ribs appear in. Soft tissues are unremarkable.  IMPRESSION: Mild degenerative change of the right acromioclavicular joint. No acute abnormality seen in the right shoulder.    Electronically Signed   By: Sabino Dick M.D.   On: 01/13/2015 14:21   Ct Head Wo Contrast  01/13/2015   CLINICAL DATA:  Altered mental status.  EXAM: CT HEAD WITHOUT CONTRAST  TECHNIQUE: Contiguous axial images were obtained from the base of the skull through the vertex without intravenous contrast.  COMPARISON:  CT scan of July 21, 2008.  FINDINGS: Bony calvarium appears intact. Mild diffuse cortical atrophy is noted. Mild chronic ischemic white matter disease is noted. Ventricular size is within normal limits. Old bilateral basal ganglia lacunar infarctions are noted. No mass lesion or hemorrhage is noted. New low density is noted inferiorly in the left cerebellar hemisphere concerning for acute infarction. No midline shift is noted.  IMPRESSION: New low density is noted inferiorly in left cerebellar hemisphere concerning for acute infarction.  MRI may be performed for further evaluation. These results will be called to the ordering clinician or representative by the Radiologist Assistant, and communication documented in the PACS or zVision Dashboard.   Electronically Signed   By: Sabino Dick M.D.   On: 01/13/2015 14:53   Mr Virgel Paling Wo Contrast  01/13/2015   CLINICAL DATA:  81 year old female with increasing lethargy over the past 2 weeks. Dementia. Multiple falls. Hit head 12/17/2014. No loss of consciousness. Initial encounter.  EXAM: MRI HEAD WITHOUT AND WITH CONTRAST  MRA HEAD WITHOUT CONTRAST  TECHNIQUE: Multiplanar, multiecho pulse sequences of the brain and surrounding structures were obtained without and with intravenous contrast. Angiographic images of the head were obtained using MRA technique without contrast.  CONTRAST:  15 cc MultiHance  COMPARISON:  01/13/2015 CT.  06/15/2013 and 12/03/2003 MR.  FINDINGS: MRI HEAD FINDINGS  Exam is motion degraded.  No acute infarct.  No intracranial hemorrhage.  Mild small vessel disease type changes.  Bilateral globus pallidus abnormality unchanged since  2004. This may reflect prominent perivascular spaces as there is no gliosis in this region.  No intracranial mass or abnormal enhancement.  Atrophy. Ventricular prominence slightly more notable than on the 2014 MR. I suspect this is related to central atrophy although would be difficult to completely exclude a mild component of hydrocephalus.  Partial opacification left mastoid air cells.  Mild cervical spondylotic changes and spinal stenosis C4-5 and less so C3-4.  Cervical medullary junction unremarkable. Partially empty sella incidentally detected. Pineal region and orbital structures unremarkable.  MRA HEAD FINDINGS  Moderate narrowing portions of the A1 and A2 segment of the left anterior cerebral artery.  Middle cerebral artery moderate narrowing and irregularity bilaterally.  Fetal type contribution to the right posterior cerebral artery.  Mild narrowing right vertebral artery.  Nonvisualized left posterior inferior cerebellar artery and right anterior inferior cerebellar artery.  Posterior cerebral artery distal branch vessel irregularity bilaterally with mild narrowing proximal left posterior cerebral artery.  No aneurysm noted.  IMPRESSION: MRI HEAD  Exam is motion degraded.  No acute infarct.  Mild small vessel disease type changes.  Bilateral globus pallidus abnormality unchanged since 2004. This may reflect prominent perivascular spaces.  No intracranial mass or abnormal enhancement.  Atrophy. Ventricular prominence slightly more notable than on the 2014 MR. I suspect this is related to central atrophy although would be difficult to completely exclude a mild component of hydrocephalus.  Partial opacification left mastoid air cells.  Mild cervical spondylotic changes and spinal stenosis C4-5 and less so C3-4.  MRA HEAD FINDINGS  Branch vessel atherosclerotic type changes as noted above.   Electronically Signed   By: Chauncey Cruel M.D.   On: 01/13/2015 18:39   Mr Jeri Cos ZO Contrast  01/13/2015    CLINICAL DATA:  81 year old female with increasing lethargy over the past 2 weeks. Dementia. Multiple falls. Hit head 12/17/2014. No loss of consciousness. Initial encounter.  EXAM: MRI HEAD WITHOUT AND WITH CONTRAST  MRA HEAD WITHOUT CONTRAST  TECHNIQUE: Multiplanar, multiecho pulse sequences of the brain and surrounding structures were obtained without and with intravenous contrast. Angiographic images of the head were obtained using MRA technique without contrast.  CONTRAST:  15 cc MultiHance  COMPARISON:  01/13/2015 CT.  06/15/2013 and 12/03/2003 MR.  FINDINGS: MRI HEAD FINDINGS  Exam is motion degraded.  No acute infarct.  No intracranial hemorrhage.  Mild small vessel disease type changes.  Bilateral globus pallidus abnormality unchanged since 2004. This may reflect  prominent perivascular spaces as there is no gliosis in this region.  No intracranial mass or abnormal enhancement.  Atrophy. Ventricular prominence slightly more notable than on the 2014 MR. I suspect this is related to central atrophy although would be difficult to completely exclude a mild component of hydrocephalus.  Partial opacification left mastoid air cells.  Mild cervical spondylotic changes and spinal stenosis C4-5 and less so C3-4.  Cervical medullary junction unremarkable. Partially empty sella incidentally detected. Pineal region and orbital structures unremarkable.  MRA HEAD FINDINGS  Moderate narrowing portions of the A1 and A2 segment of the left anterior cerebral artery.  Middle cerebral artery moderate narrowing and irregularity bilaterally.  Fetal type contribution to the right posterior cerebral artery.  Mild narrowing right vertebral artery.  Nonvisualized left posterior inferior cerebellar artery and right anterior inferior cerebellar artery.  Posterior cerebral artery distal branch vessel irregularity bilaterally with mild narrowing proximal left posterior cerebral artery.  No aneurysm noted.  IMPRESSION: MRI HEAD  Exam is  motion degraded.  No acute infarct.  Mild small vessel disease type changes.  Bilateral globus pallidus abnormality unchanged since 2004. This may reflect prominent perivascular spaces.  No intracranial mass or abnormal enhancement.  Atrophy. Ventricular prominence slightly more notable than on the 2014 MR. I suspect this is related to central atrophy although would be difficult to completely exclude a mild component of hydrocephalus.  Partial opacification left mastoid air cells.  Mild cervical spondylotic changes and spinal stenosis C4-5 and less so C3-4.  MRA HEAD FINDINGS  Branch vessel atherosclerotic type changes as noted above.   Electronically Signed   By: Chauncey Cruel M.D.   On: 01/13/2015 18:39    History of Present Illness: Azyah is back with her husband and caregiver today for reevaluation. More lethargic for the past 2 weeks. Had to start feeding her on Sunday. Pain in right shoulder and middle of back started last week. Pain worse when moving. Taking Tramadol every 6 hours and Ibuprofen. She was seen by Avanell Shackleton, NP on 1/18 with increased lethargy, cough, weakness. Chest x-ray done at [facility] showed right lower lobe infiltrate. 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 Eating Ok. Not drinking well. Seems to be holding fluids and pills in her mouth. Cough is getting better.   Dementia is progressing. Increased Depakote to tid in 8/15 due to increased sundowning and agitation.   Multiple falls recently- has not hit head since Christmas when she fell back on back of her head. No loss of consciousness. No known injuries. Generally just slumps to the ground when she falls. Has not been walking at all for the past week.   Hospital Course: Abbegale was admitted to a medical bed. She was hydrated with IV fluids due to volume depletion/dehydration. She was continued on Levaquin for pneumonia coverage. Repeat chest x-ray showed resolution of pneumonia. Due to  her altered mental status and increased confusion a head CT was performed that showed possible cerebellar stroke. An MRI/MRA was done for further evaluation which showed no acute infarct or abnormality. X-rays of her thoracic spine, neck, and right shoulder showed no evidence of fracture or acute abnormality. She continued to have right upper back/shoulder pain with movement but this seemed to have improved during his hospitalization. This was treated with Tylenol and infrequent use of tramadol and Vicodin. She did have increased somnolence after her pain medications but overall her mental status improved and is now approaching baseline. She has 24 hour caregivers that  have been present throughout her hospitalization. Her caregiver feels that her mental status is clearing with significant improvement since admission. She is eating well with some assistance and able to ambulate with assistance. She was evaluated by physical therapy/occupational therapy who recommended 24-hour care versus skilled nursing facility rehabilitation. With the 24-hour care that she has at home her husband prefers discharge home. At this time, given improvement in her mental status and overall functional status she is being discharged home. Will continue physical therapy/occupational therapy at home. Of note, we have decreased her Depakote to twice a day and are going to try to discontinue her Restoril and Ambien. We'll use tramadol sparingly to avoid sedating medications.  Mikelle remained in the hospital 1 additional day due to lack of bowel movement for 6 days.  She was provided Mag citrate and enema treatment.  Ultimately, after disimpaction she had multiple loose stools.  Due to weather conditions and concern with transporting her in this state she was kept overnight.  Her loose stools have stabilized and her caregiver feels comfortable transporting her home today.  In addition, her urine sensitivities for Escherichia coli have  returned resistant to Levaquin that she was taking for her pneumonia.  It is sensitive to Bactrim and Macrodantin.  Given sulfa allergy she will be treated with Macrodantin for 7 days for UTI.  At this point she is stable for discharge home with 24 hour assistance and continued antibiotic treatment.   Day of Discharge Exam BP 111/68 mmHg  Pulse 87  Temp(Src) 98.4 F (36.9 C) (Oral)  Resp 18  Ht $R'5\' 6"'Rl$  (1.676 m)  Wt 69.627 kg (153 lb 8 oz)  BMI 24.79 kg/m2  SpO2 99%  Physical Exam: General appearance: awake, incoherent speech at times though prayer is very coherent; disoriented Eyes: no scleral icterus Throat: oropharynx moist without erythema Resp: clear to auscultation bilaterally Cardio: regular rate and rhythm GI: soft, non-tender; bowel sounds normal; no masses,  no organomegaly Extremities: no clubbing, cyanosis or edema  Discharge Labs:  Recent Labs  01/17/15 0604 01/18/15 0420  NA 139 140  K 4.3 3.8  CL 104 104  CO2 25 30  GLUCOSE 115* 118*  BUN 10 11  CREATININE 0.46* 0.53  CALCIUM 8.6 8.9   Hepatic Function Latest Ref Rng 01/13/2015 01/26/2014 01/25/2014  Total Protein 6.0 - 8.3 g/dL 6.5 5.8(L) 6.4  Albumin 3.5 - 5.2 g/dL 2.7(L) 2.9(L) 3.2(L)  AST 0 - 37 U/L $Remo'26 18 28  'cjQzb$ ALT 0 - 35 U/L 41(H) 12 13  Alk Phosphatase 39 - 117 U/L 65 50 58  Total Bilirubin 0.3 - 1.2 mg/dL 0.5 <0.2(L) <0.2(L)    Recent Labs  01/17/15 0604 01/18/15 0420  WBC 7.5 8.1  HGB 10.2* 10.6*  HCT 31.4* 32.5*  MCV 94.6 94.8  PLT 220 227   Urine Culture    Component Value Date/Time   SDES URINE, CATHETERIZED 01/13/2015 1600   SPECREQUEST NONE 01/13/2015 1600   CULT  01/13/2015 1600    ESCHERICHIA COLI Performed at [facility] 01/16/2015 FINAL 01/13/2015 1600  Resistant to Levaquin, sensitive to Sulfa and Nitrofurantoin  Discharge instructions:     Discharge Instructions    Diet general    Complete by:  As directed      Discharge instructions    Complete  by:  As directed   Fall precautions- walk with assistance. Avoid sedating medications including Ambien, Restoril and use Tramadol sparingly due to risk of excessive  sedation.  Use heat, stretching and PT for shoulder pain.     Increase activity slowly    Complete by:  As directed            Disposition: to home with 24 hour care  Follow-up Appts: Follow-up with Dr. Brigitte Pulse at [facility] in one week.  Our office will call with transition of care visit.  Condition on Discharge: stable, declining functional status  Tests Needing Follow-up:  None  Signed: Marton Redwood 01/18/2015, 8:12 AM

## 2015-02-08 ENCOUNTER — Encounter (INDEPENDENT_AMBULATORY_CARE_PROVIDER_SITE_OTHER): Payer: Self-pay

## 2015-02-08 DIAGNOSIS — Z0289 Encounter for other administrative examinations: Secondary | ICD-10-CM

## 2015-02-16 ENCOUNTER — Other Ambulatory Visit: Payer: Self-pay | Admitting: Diagnostic Neuroimaging

## 2015-02-16 DIAGNOSIS — F0390 Unspecified dementia without behavioral disturbance: Secondary | ICD-10-CM

## 2015-02-23 ENCOUNTER — Ambulatory Visit (HOSPITAL_COMMUNITY): Payer: Medicare Other

## 2015-02-24 ENCOUNTER — Ambulatory Visit (HOSPITAL_COMMUNITY): Payer: Medicare Other

## 2015-02-28 ENCOUNTER — Telehealth: Payer: Self-pay | Admitting: Neurology

## 2015-02-28 NOTE — Telephone Encounter (Signed)
I spoke to the patient's husband in re Visit 41 for the [facility] trial scheduled on [id number]. This visit was cancelled. I advised the patient's husband to still go to the MRI appointment, and we will be rescheduling Visit 22 soon.

## 2015-03-01 ENCOUNTER — Other Ambulatory Visit: Payer: Self-pay

## 2015-03-02 ENCOUNTER — Ambulatory Visit
Admission: RE | Admit: 2015-03-02 | Discharge: 2015-03-02 | Disposition: A | Discharge status: Home / Self Care | Payer: No Typology Code available for payment source | Source: Ambulatory Visit | Attending: Diagnostic Neuroimaging | Admitting: Diagnostic Neuroimaging

## 2015-03-02 DIAGNOSIS — F0390 Unspecified dementia without behavioral disturbance: Secondary | ICD-10-CM

## 2015-03-03 ENCOUNTER — Encounter: Payer: Self-pay | Admitting: Diagnostic Neuroimaging

## 2015-03-04 ENCOUNTER — Telehealth: Payer: Self-pay | Admitting: Neurology

## 2015-03-04 NOTE — Telephone Encounter (Signed)
I spoke to Mr. Villegas, the patient's husband, about the prescription Ativan (Lorazepam) 0.5mg  for the patient to take prior to MRI scan for the [facility] research trial as per Dr. Clydene Fake instructions. Nevertheless, Mr. Catino was hesitant to give this prescription to the patient due to the patient's previous experience with sedatives. Mr. Jorstad will talk to her wife's PCP about the sedative.

## 2015-03-14 ENCOUNTER — Other Ambulatory Visit: Payer: Self-pay | Admitting: Neurology

## 2015-03-14 ENCOUNTER — Telehealth: Payer: Self-pay | Admitting: Neurology

## 2015-03-14 DIAGNOSIS — F0280 Dementia in other diseases classified elsewhere without behavioral disturbance: Secondary | ICD-10-CM

## 2015-03-14 DIAGNOSIS — G309 Alzheimer's disease, unspecified: Principal | ICD-10-CM

## 2015-03-14 NOTE — Telephone Encounter (Signed)
I gave prescription of valium 5 mg prior to MRI to [name]  and also ordered MRI scan as per [facility] study

## 2015-03-14 NOTE — Telephone Encounter (Signed)
Patient's husband called in regards to the MRI appointment for the [employer]. Patient's husband would like to get a prescription for her wife to take prior to the MRI. Please advice.

## 2015-03-15 ENCOUNTER — Other Ambulatory Visit: Payer: Self-pay | Admitting: Neurology

## 2015-03-15 MED ORDER — LORAZEPAM 0.5 MG PO TABS: 0.5000 mg | ORAL_TABLET | ORAL | Status: AC

## 2015-03-15 NOTE — Telephone Encounter (Signed)
Ok I will prescibe ativan instead

## 2015-03-15 NOTE — Telephone Encounter (Signed)
I spoke to the patient's husband, Mr. Peplinski, about picking up the prescription, but Mr. Gundlach stated that the patient has suffered some itching when taking this medication (see allergies). Mr. Stephani would like a different prescription. Please advice.

## 2015-03-17 NOTE — Telephone Encounter (Signed)
I spoke to [name], and he will pick up the prescription at the front desk.

## 2015-03-22 ENCOUNTER — Telehealth: Payer: Self-pay | Admitting: Neurology

## 2015-03-22 NOTE — Telephone Encounter (Signed)
I spoke to the patient's partner, Mr. Staley, about changing the time of the PET Scan from 08:00h on 07APR2016 to 14:00h on 07APR2016. Mr. Laureano agreed with the change.

## 2015-03-28 ENCOUNTER — Other Ambulatory Visit: Payer: Self-pay

## 2015-03-30 ENCOUNTER — Ambulatory Visit
Admission: RE | Admit: 2015-03-30 | Discharge: 2015-03-30 | Disposition: A | Discharge status: Home / Self Care | Payer: Self-pay | Source: Ambulatory Visit | Attending: Neurology | Admitting: Neurology

## 2015-03-30 ENCOUNTER — Ambulatory Visit
Admission: RE | Admit: 2015-03-30 | Discharge: 2015-03-30 | Disposition: A | Discharge status: Home / Self Care | Payer: No Typology Code available for payment source | Source: Ambulatory Visit | Attending: Neurology | Admitting: Neurology

## 2015-03-30 ENCOUNTER — Other Ambulatory Visit: Payer: Self-pay | Admitting: Neurology

## 2015-03-30 DIAGNOSIS — G309 Alzheimer's disease, unspecified: Principal | ICD-10-CM

## 2015-03-30 DIAGNOSIS — F0280 Dementia in other diseases classified elsewhere without behavioral disturbance: Secondary | ICD-10-CM

## 2015-03-31 ENCOUNTER — Ambulatory Visit (HOSPITAL_COMMUNITY)
Admission: RE | Admit: 2015-03-31 | Discharge: 2015-03-31 | Disposition: A | Discharge status: Home / Self Care | Payer: No Typology Code available for payment source | Source: Ambulatory Visit | Attending: Diagnostic Neuroimaging | Admitting: Diagnostic Neuroimaging

## 2015-03-31 ENCOUNTER — Encounter (HOSPITAL_COMMUNITY): Payer: Self-pay

## 2015-03-31 ENCOUNTER — Ambulatory Visit (HOSPITAL_COMMUNITY): Payer: Self-pay

## 2015-03-31 DIAGNOSIS — F0390 Unspecified dementia without behavioral disturbance: Secondary | ICD-10-CM | POA: Insufficient documentation

## 2015-03-31 MED ORDER — FLUDEOXYGLUCOSE F - 18 (FDG) INJECTION
10.1000 | Freq: Once | INTRAVENOUS | Status: AC | PRN
  Administered 2015-03-31: 10.1 via INTRAVENOUS

## 2015-04-08 ENCOUNTER — Other Ambulatory Visit (HOSPITAL_COMMUNITY): Payer: Self-pay | Admitting: Internal Medicine

## 2015-04-08 ENCOUNTER — Ambulatory Visit (HOSPITAL_COMMUNITY)
Admission: RE | Admit: 2015-04-08 | Discharge: 2015-04-08 | Disposition: A | Discharge status: Home / Self Care | Payer: Medicare Other | Source: Ambulatory Visit | Attending: Vascular Surgery | Admitting: Vascular Surgery

## 2015-04-08 DIAGNOSIS — R609 Edema, unspecified: Secondary | ICD-10-CM | POA: Diagnosis not present

## 2015-04-21 ENCOUNTER — Encounter (HOSPITAL_COMMUNITY): Payer: Self-pay | Admitting: Emergency Medicine

## 2015-04-21 ENCOUNTER — Emergency Department (HOSPITAL_COMMUNITY)
Admission: EM | Admit: 2015-04-21 | Discharge: 2015-04-21 | Disposition: A | Discharge status: Home / Self Care | Payer: Medicare Other | Attending: Emergency Medicine | Admitting: Emergency Medicine

## 2015-04-21 ENCOUNTER — Emergency Department (HOSPITAL_COMMUNITY): Payer: Medicare Other

## 2015-04-21 DIAGNOSIS — Z8551 Personal history of malignant neoplasm of bladder: Secondary | ICD-10-CM | POA: Diagnosis not present

## 2015-04-21 DIAGNOSIS — I471 Supraventricular tachycardia: Secondary | ICD-10-CM | POA: Insufficient documentation

## 2015-04-21 DIAGNOSIS — F419 Anxiety disorder, unspecified: Secondary | ICD-10-CM | POA: Diagnosis not present

## 2015-04-21 DIAGNOSIS — F0390 Unspecified dementia without behavioral disturbance: Secondary | ICD-10-CM | POA: Insufficient documentation

## 2015-04-21 DIAGNOSIS — K219 Gastro-esophageal reflux disease without esophagitis: Secondary | ICD-10-CM | POA: Insufficient documentation

## 2015-04-21 DIAGNOSIS — Z9049 Acquired absence of other specified parts of digestive tract: Secondary | ICD-10-CM | POA: Diagnosis not present

## 2015-04-21 DIAGNOSIS — Z8639 Personal history of other endocrine, nutritional and metabolic disease: Secondary | ICD-10-CM | POA: Diagnosis not present

## 2015-04-21 DIAGNOSIS — R5383 Other fatigue: Secondary | ICD-10-CM | POA: Diagnosis not present

## 2015-04-21 DIAGNOSIS — Z87891 Personal history of nicotine dependence: Secondary | ICD-10-CM | POA: Diagnosis not present

## 2015-04-21 DIAGNOSIS — I2510 Atherosclerotic heart disease of native coronary artery without angina pectoris: Secondary | ICD-10-CM | POA: Insufficient documentation

## 2015-04-21 DIAGNOSIS — M6250 Muscle wasting and atrophy, not elsewhere classified, unspecified site: Secondary | ICD-10-CM | POA: Insufficient documentation

## 2015-04-21 DIAGNOSIS — Z880 Allergy status to penicillin: Secondary | ICD-10-CM | POA: Insufficient documentation

## 2015-04-21 DIAGNOSIS — Z79899 Other long term (current) drug therapy: Secondary | ICD-10-CM | POA: Insufficient documentation

## 2015-04-21 DIAGNOSIS — R011 Cardiac murmur, unspecified: Secondary | ICD-10-CM | POA: Diagnosis not present

## 2015-04-21 DIAGNOSIS — I10 Essential (primary) hypertension: Secondary | ICD-10-CM | POA: Diagnosis not present

## 2015-04-21 DIAGNOSIS — M8580 Other specified disorders of bone density and structure, unspecified site: Secondary | ICD-10-CM | POA: Insufficient documentation

## 2015-04-21 DIAGNOSIS — N390 Urinary tract infection, site not specified: Secondary | ICD-10-CM | POA: Diagnosis present

## 2015-04-21 DIAGNOSIS — Z9181 History of falling: Secondary | ICD-10-CM | POA: Insufficient documentation

## 2015-04-21 LAB — COMPREHENSIVE METABOLIC PANEL
ALK PHOS: 76 U/L (ref 39–117)
ALT: 20 U/L (ref 0–35)
AST: 23 U/L (ref 0–37)
Albumin: 3.2 g/dL — ABNORMAL LOW (ref 3.5–5.2)
Anion gap: 8 (ref 5–15)
BUN: 19 mg/dL (ref 6–23)
CALCIUM: 9.2 mg/dL (ref 8.4–10.5)
CO2: 30 mmol/L (ref 19–32)
Chloride: 96 mmol/L (ref 96–112)
Creatinine, Ser: 0.5 mg/dL (ref 0.50–1.10)
GFR calc Af Amer: >90 mL/min (ref >90)
GFR, EST NON AFRICAN AMERICAN: 85 mL/min — AB (ref >90)
GLUCOSE: 142 mg/dL — AB (ref 70–99)
POTASSIUM: 4.1 mmol/L (ref 3.5–5.1)
Sodium: 134 mmol/L — ABNORMAL LOW (ref 135–145)
Total Bilirubin: 0.5 mg/dL (ref 0.3–1.2)
Total Protein: 6.8 g/dL (ref 6.0–8.3)

## 2015-04-21 LAB — CBC
HCT: 33.4 % — ABNORMAL LOW (ref 36.0–46.0)
Hemoglobin: 10.9 g/dL — ABNORMAL LOW (ref 12.0–15.0)
MCH: 31.8 pg (ref 26.0–34.0)
MCHC: 32.6 g/dL (ref 30.0–36.0)
MCV: 97.4 fL (ref 78.0–100.0)
Platelets: 193 10*3/uL (ref 150–400)
RBC: 3.43 MIL/uL — ABNORMAL LOW (ref 3.87–5.11)
RDW: 14.2 % (ref 11.5–15.5)
WBC: 6.6 10*3/uL (ref 4.0–10.5)

## 2015-04-21 LAB — URINE MICROSCOPIC-ADD ON

## 2015-04-21 LAB — URINALYSIS, ROUTINE W REFLEX MICROSCOPIC
Bilirubin Urine: NEGATIVE
Glucose, UA: NEGATIVE mg/dL
KETONES UR: NEGATIVE mg/dL
Leukocytes, UA: NEGATIVE
Nitrite: NEGATIVE
PH: 7 (ref 5.0–8.0)
Protein, ur: NEGATIVE mg/dL
SPECIFIC GRAVITY, URINE: 1.011 (ref 1.005–1.030)
Urobilinogen, UA: 0.2 mg/dL (ref 0.0–1.0)

## 2015-04-21 NOTE — ED Notes (Signed)
Nurse currently starting an IV.

## 2015-04-21 NOTE — ED Notes (Signed)
Per EMS, pt was diagnosed with UTI on Monday, given one dose of "powdered antibiotic."  According to family, pt has become more lethargic since Monday and has foul smelling urine.  Pt has ilial conduit. Family denies fever.

## 2015-04-21 NOTE — ED Provider Notes (Signed)
CSN: [id number]     Arrival date & time 04/21/15  1150 History   First MD Initiated Contact with Patient 04/21/15 1208     Chief Complaint  Patient presents with  . Urinary Tract Infection     (Consider location/radiation/quality/duration/timing/severity/associated sxs/prior Treatment) HPI  Patient presents with her husband who provide history of present illness. The patient withdrawn, essentially noncommunicative. Husband states she has dementia, level V caveat. Earlier this week after diagnosis with urinary tract infection patient was provided a single course of antibiotics, well tolerated. However, the patient notes over the past 3 days the patient has become more withdrawn, less interactive even than her baseline. No report of new fever, vomiting, diarrhea. There is report of increased constipation.   Past Medical History  Diagnosis Date  . Bladder cancer     s/p cystectomy/ureterostomy  . Hypertension   . S/P appendectomy   . GERD (gastroesophageal reflux disease)   . Hyperlipidemia   . Dementia   . SVT (supraventricular tachycardia)   . Vitamin D deficiency   . Carotid stenosis   . Osteopenia   . Syncope   . Anxiety   . Cervical disc disease   . Pyelonephritis   . LBBB (left bundle branch block)   . Ventricular tachycardia   . CAD (coronary artery disease)   . PVC (premature ventricular contraction)   . Headache(784.0)   . Cervical spondylosis   . Heartburn   . Hydronephrosis   . Hypoglycemia   . Rectocele   . Microscopic hematuria   . Heart murmur   . Jejunostomy tube fell out   . Fall at home Nov. 9, 2014 and Dec. 2015    Pt was picking up her dog and fell and at home   Past Surgical History  Procedure Laterality Date  . Total abdominal hysterectomy w/ bilateral salpingoophorectomy    . Hernia repair    . Cystectomy    . Ureterostomy    . Appendectom    . Cardiac catheterization    . Fracture surgery    . Abdominal hysterectomy     Family  History  Problem Relation Age of Onset  . Lung cancer Father   . Cancer Father   . Heart attack Father   . Heart disease Mother     Heart Disease before age 17  . Allergies Mother   . Hypertension Mother   . Heart attack Mother   . Heart disease Sister   . Allergies Sister    History  Substance Use Topics  . Smoking status: Former Smoker -- 0.10 packs/day for 4 years    Types: Cigarettes    Quit date: 12/24/1949  . Smokeless tobacco: Never Used  . Alcohol Use: No   OB History    No data available     Review of Systems  Unable to perform ROS: Dementia      Allergies  Bethanechol chloride; Celecoxib; Citalopram hydrobromide; Clindamycin; Codeine; Desipramine; Epinephrine; Erythromycin; Floxin; Glucose; Hydromorphone hcl; Ibuprofen; Ibuprofen; Iodine; Meperidine hcl; Morphine; Paroxetine hcl; Penicillins; Procaine hcl; Propranolol hcl; Red dye; Restoril; Scopolamine; Sulfonamide derivatives; Tetracycline; Trazodone and nefazodone; and Valium  Home Medications   Prior to Admission medications   Medication Sig Start Date End Date Taking? Authorizing Provider  atenolol (TENORMIN) 25 MG tablet Take 25 mg by mouth 2 (two) times daily.   Yes Historical Provider, MD  Calcium Carbonate (CALTRATE 600 PO) Take 1 tablet by mouth daily.     Yes Historical Provider, MD  cetirizine (ZYRTEC) 10 MG tablet Take 10 mg by mouth daily.   Yes Historical Provider, MD  divalproex (DEPAKOTE SPRINKLE) 125 MG capsule Take 250 mg by mouth 2 (two) times daily. 03/19/15  Yes Historical Provider, MD  Docusate Calcium (STOOL SOFTENER PO) Take 1 capsule by mouth daily as needed (constipation).    Yes Historical Provider, MD  donepezil (ARICEPT) 10 MG tablet Take 10 mg by mouth daily.     Yes Historical Provider, MD  furosemide (LASIX) 20 MG tablet Take 1 tablet by mouth daily. 04/12/15  Yes Historical Provider, MD  Lansoprazole (PREVACID PO) Take 1 tablet by mouth 2 (two) times daily.   Yes Historical  Provider, MD  Memantine HCl ER (NAMENDA XR) 28 MG CP24 Take 28 mg by mouth daily.   Yes Historical Provider, MD  Multiple Vitamin (MULTIVITAMIN) capsule Take 1 capsule by mouth daily.     Yes Historical Provider, MD  nortriptyline (PAMELOR) 25 MG capsule Take 25 mg by mouth at bedtime.   Yes Historical Provider, MD  polyethylene glycol (MIRALAX / GLYCOLAX) packet Take 17 g by mouth daily as needed for mild constipation. 01/18/15  Yes Marton Redwood, MD  temazepam (RESTORIL) 15 MG capsule Take 1 capsule by mouth at bedtime. 04/12/15  Yes Historical Provider, MD  traMADol (ULTRAM) 50 MG tablet Take 25 mg by mouth every 12 (twelve) hours as needed for moderate pain.   Yes Historical Provider, MD  acetaminophen (TYLENOL) 325 MG tablet Take 2 tablets (650 mg total) by mouth every 6 (six) hours as needed for mild pain (or Fever >/= 101). Patient not taking: Reported on 04/21/2015 01/17/15   Marton Redwood, MD  divalproex (DEPAKOTE) 250 MG DR tablet Take 1 tablet (250 mg total) by mouth 2 (two) times daily. Patient not taking: Reported on 04/21/2015 01/17/15   Marton Redwood, MD  hydrocortisone (ANUSOL-HC) 2.5 % rectal cream Place rectally 2 (two) times daily. Patient not taking: Reported on 04/21/2015 01/18/15   Marton Redwood, MD  LORazepam (ATIVAN) 0.5 MG tablet Take 1 tablet (0.5 mg total) by mouth as directed. Patient not taking: Reported on 04/21/2015 03/15/15   Garvin Fila, MD  nitrofurantoin (MACRODANTIN) 100 MG capsule Take 1 capsule (100 mg total) by mouth every 12 (twelve) hours. Patient not taking: Reported on 04/21/2015 01/18/15   Marton Redwood, MD   BP 133/45 mmHg  Pulse 85  Temp(Src) 97.7 F (36.5 C) (Oral)  Resp 22  SpO2 97% Physical Exam  Constitutional: She appears listless. She has a sickly appearance.  HENT:  Head: Normocephalic and atraumatic.  Eyes: Conjunctivae and EOM are normal.  Cardiovascular: Normal rate and regular rhythm.   Pulmonary/Chest: Effort normal and breath sounds normal.  No stridor. No respiratory distress.  Abdominal: She exhibits no distension.    Musculoskeletal: She exhibits no edema.  Neurological: She appears listless. She displays atrophy. She displays no tremor. She exhibits abnormal muscle tone. She displays no seizure activity.  Faces symmetric, the patient does not follow commands reliably.  Skin: Skin is warm and dry.  Psychiatric: She is withdrawn. Cognition and memory are impaired. She is noncommunicative.  Nursing note and vitals reviewed.   ED Course  Procedures (including critical care time) Labs Review Labs Reviewed  CBC - Abnormal; Notable for the following:    RBC 3.43 (*)    Hemoglobin 10.9 (*)    HCT 33.4 (*)    All other components within normal limits  COMPREHENSIVE METABOLIC PANEL - Abnormal; Notable for the  following:    Sodium 134 (*)    Glucose, Bld 142 (*)    Albumin 3.2 (*)    GFR calc non Af Amer 85 (*)    All other components within normal limits  URINALYSIS, ROUTINE W REFLEX MICROSCOPIC - Abnormal; Notable for the following:    APPearance CLOUDY (*)    Hgb urine dipstick TRACE (*)    All other components within normal limits  URINE MICROSCOPIC-ADD ON - Abnormal; Notable for the following:    Bacteria, UA MANY (*)    Casts HYALINE CASTS (*)    All other components within normal limits    Imaging Review Ct Head Wo Contrast  04/21/2015   CLINICAL DATA:  Dementia.  Decreased activity level.  EXAM: CT HEAD WITHOUT CONTRAST  TECHNIQUE: Contiguous axial images were obtained from the base of the skull through the vertex without intravenous contrast.  COMPARISON:  Brain MRI and head CT scans 01/13/2015.  FINDINGS: Cortical atrophy is identified as on prior studies and there is some chronic microvascular ischemic change. No evidence of acute intracranial abnormality including hemorrhage, infarct, mass lesion, mass effect, midline shift or abnormal extra-axial fluid collection is identified. There is no hydrocephalus or  pneumocephalus. The calvarium is intact. Imaged paranasal sinuses and mastoid air cells are clear.  IMPRESSION: No acute abnormality.  Cortical atrophy.   Electronically Signed   By: Inge Rise M.D.   On: 04/21/2015 14:49   On repeat exam the patient is calm. I discussed all findings the patient, her husband, the caregiver. With largely reassuring findings, patient was discharged to follow-up with primary care in 4 days. Specific return precautions, follow-up instructions discussed.  MDM   Elderly female with dementia presents with family members with concern of altered mental status, decreased interactivity. Here the patient is calm, hemodynamically stable, but minimally interactive throughout her emergency department evaluation. No vital sign or lab evidence for bacteremia or sepsis. No evidence for urinary tract infection. No CT evidence for progressive intracranial pathology. Patient was discharged into the care of her family members to follow-up with primary care.   Carmin Muskrat, MD 04/21/15 [phone]

## 2015-04-21 NOTE — Discharge Instructions (Signed)
As discussed, today's evaluation is largely reassuring, but with your wife's dementia, close monitoring is essential.  Please be sure to return here for concerning changes in her condition.  Otherwise, please be sure to follow-up with her physician in 4 days.

## 2015-04-21 NOTE — ED Notes (Signed)
Bed: WA09 Expected date:  Expected time:  Means of arrival:  Comments: ems 

## 2015-06-02 ENCOUNTER — Encounter (INDEPENDENT_AMBULATORY_CARE_PROVIDER_SITE_OTHER): Payer: Self-pay

## 2015-06-02 DIAGNOSIS — Z0289 Encounter for other administrative examinations: Secondary | ICD-10-CM

## 2015-06-28 ENCOUNTER — Encounter (INDEPENDENT_AMBULATORY_CARE_PROVIDER_SITE_OTHER): Payer: Self-pay | Admitting: Diagnostic Neuroimaging

## 2015-06-28 DIAGNOSIS — Z0289 Encounter for other administrative examinations: Secondary | ICD-10-CM

## 2015-06-29 ENCOUNTER — Encounter: Payer: Self-pay | Admitting: Diagnostic Neuroimaging

## 2015-07-27 ENCOUNTER — Ambulatory Visit (INDEPENDENT_AMBULATORY_CARE_PROVIDER_SITE_OTHER): Payer: Self-pay | Admitting: Neurology

## 2015-07-27 DIAGNOSIS — F0280 Dementia in other diseases classified elsewhere without behavioral disturbance: Secondary | ICD-10-CM

## 2015-07-27 DIAGNOSIS — G309 Alzheimer's disease, unspecified: Secondary | ICD-10-CM

## 2015-07-27 NOTE — Progress Notes (Signed)
EXPEDTION 3 END OF STUDY VISIT [name] is seen today findings of study visit accompanied by her husband and daughter. She has had increasing difficulty in meeting study requirements over the last few months with inability to get up and lie on the bed needing to be lifted as well as there has been difficulty with finding IV access requiring as many as 4-5 weeks every time. After mutual discussion with the patient's husband, daughter and myself he feels it is in the patient's interests to discontinue the study due to the significant amount of discomfort involved in trying to get IV access and moving the patient on the bed to get her infusion. She had physical exam and neurological exam performed as per study protocol. I recommend patient continue on Namenda XR 28 mg daily and Aricept 10 mg daily. I briefly discussed with the patient's husband and daughter increasing Aricept to 23 mg but they do not want to take the risk of increased side effects and would like to continue present treatment for the time being. There was advised to follow-up with me for routine clinic follow-up appointment in 6 months or call earlier if necessary.  Antony Contras, MD

## 2016-05-14 ENCOUNTER — Inpatient Hospital Stay (HOSPITAL_COMMUNITY)
Admission: EM | Admit: 2016-05-14 | Discharge: 2016-05-23 | DRG: 871 | Disposition: A | Discharge status: Home Health | Payer: Medicare Other | Attending: Internal Medicine | Admitting: Internal Medicine

## 2016-05-14 ENCOUNTER — Emergency Department (HOSPITAL_COMMUNITY): Payer: Medicare Other

## 2016-05-14 ENCOUNTER — Other Ambulatory Visit: Payer: Self-pay

## 2016-05-14 ENCOUNTER — Encounter (HOSPITAL_COMMUNITY): Payer: Self-pay | Admitting: Emergency Medicine

## 2016-05-14 DIAGNOSIS — G309 Alzheimer's disease, unspecified: Secondary | ICD-10-CM | POA: Diagnosis present

## 2016-05-14 DIAGNOSIS — I447 Left bundle-branch block, unspecified: Secondary | ICD-10-CM | POA: Diagnosis present

## 2016-05-14 DIAGNOSIS — Z880 Allergy status to penicillin: Secondary | ICD-10-CM

## 2016-05-14 DIAGNOSIS — Z90710 Acquired absence of both cervix and uterus: Secondary | ICD-10-CM

## 2016-05-14 DIAGNOSIS — A4151 Sepsis due to Escherichia coli [E. coli]: Secondary | ICD-10-CM | POA: Diagnosis not present

## 2016-05-14 DIAGNOSIS — N3000 Acute cystitis without hematuria: Secondary | ICD-10-CM

## 2016-05-14 DIAGNOSIS — Z936 Other artificial openings of urinary tract status: Secondary | ICD-10-CM

## 2016-05-14 DIAGNOSIS — R609 Edema, unspecified: Secondary | ICD-10-CM

## 2016-05-14 DIAGNOSIS — R5383 Other fatigue: Secondary | ICD-10-CM

## 2016-05-14 DIAGNOSIS — Z886 Allergy status to analgesic agent status: Secondary | ICD-10-CM

## 2016-05-14 DIAGNOSIS — D638 Anemia in other chronic diseases classified elsewhere: Secondary | ICD-10-CM | POA: Diagnosis present

## 2016-05-14 DIAGNOSIS — R4182 Altered mental status, unspecified: Secondary | ICD-10-CM | POA: Diagnosis present

## 2016-05-14 DIAGNOSIS — Z882 Allergy status to sulfonamides status: Secondary | ICD-10-CM

## 2016-05-14 DIAGNOSIS — I2510 Atherosclerotic heart disease of native coronary artery without angina pectoris: Secondary | ICD-10-CM | POA: Diagnosis present

## 2016-05-14 DIAGNOSIS — N390 Urinary tract infection, site not specified: Secondary | ICD-10-CM | POA: Diagnosis present

## 2016-05-14 DIAGNOSIS — A419 Sepsis, unspecified organism: Secondary | ICD-10-CM | POA: Diagnosis present

## 2016-05-14 DIAGNOSIS — Z8551 Personal history of malignant neoplasm of bladder: Secondary | ICD-10-CM

## 2016-05-14 DIAGNOSIS — R509 Fever, unspecified: Secondary | ICD-10-CM | POA: Diagnosis not present

## 2016-05-14 DIAGNOSIS — E785 Hyperlipidemia, unspecified: Secondary | ICD-10-CM | POA: Diagnosis present

## 2016-05-14 DIAGNOSIS — R05 Cough: Secondary | ICD-10-CM

## 2016-05-14 DIAGNOSIS — G9340 Encephalopathy, unspecified: Secondary | ICD-10-CM | POA: Diagnosis present

## 2016-05-14 DIAGNOSIS — Z885 Allergy status to narcotic agent status: Secondary | ICD-10-CM

## 2016-05-14 DIAGNOSIS — Z9181 History of falling: Secondary | ICD-10-CM

## 2016-05-14 DIAGNOSIS — Z9049 Acquired absence of other specified parts of digestive tract: Secondary | ICD-10-CM

## 2016-05-14 DIAGNOSIS — Z66 Do not resuscitate: Secondary | ICD-10-CM | POA: Diagnosis present

## 2016-05-14 DIAGNOSIS — Z87891 Personal history of nicotine dependence: Secondary | ICD-10-CM

## 2016-05-14 DIAGNOSIS — Z881 Allergy status to other antibiotic agents status: Secondary | ICD-10-CM

## 2016-05-14 DIAGNOSIS — Z906 Acquired absence of other parts of urinary tract: Secondary | ICD-10-CM

## 2016-05-14 DIAGNOSIS — Z7401 Bed confinement status: Secondary | ICD-10-CM

## 2016-05-14 DIAGNOSIS — M8580 Other specified disorders of bone density and structure, unspecified site: Secondary | ICD-10-CM | POA: Diagnosis present

## 2016-05-14 DIAGNOSIS — R400 Somnolence: Secondary | ICD-10-CM

## 2016-05-14 DIAGNOSIS — Z79899 Other long term (current) drug therapy: Secondary | ICD-10-CM

## 2016-05-14 DIAGNOSIS — R059 Cough, unspecified: Secondary | ICD-10-CM

## 2016-05-14 DIAGNOSIS — Z91041 Radiographic dye allergy status: Secondary | ICD-10-CM

## 2016-05-14 DIAGNOSIS — K219 Gastro-esophageal reflux disease without esophagitis: Secondary | ICD-10-CM | POA: Diagnosis present

## 2016-05-14 DIAGNOSIS — Z87440 Personal history of urinary (tract) infections: Secondary | ICD-10-CM

## 2016-05-14 DIAGNOSIS — Z888 Allergy status to other drugs, medicaments and biological substances status: Secondary | ICD-10-CM

## 2016-05-14 DIAGNOSIS — I10 Essential (primary) hypertension: Secondary | ICD-10-CM | POA: Diagnosis present

## 2016-05-14 DIAGNOSIS — F0280 Dementia in other diseases classified elsewhere without behavioral disturbance: Secondary | ICD-10-CM | POA: Diagnosis present

## 2016-05-14 DIAGNOSIS — G92 Toxic encephalopathy: Secondary | ICD-10-CM | POA: Diagnosis present

## 2016-05-14 DIAGNOSIS — J690 Pneumonitis due to inhalation of food and vomit: Secondary | ICD-10-CM | POA: Diagnosis present

## 2016-05-14 DIAGNOSIS — Z8249 Family history of ischemic heart disease and other diseases of the circulatory system: Secondary | ICD-10-CM

## 2016-05-14 DIAGNOSIS — F419 Anxiety disorder, unspecified: Secondary | ICD-10-CM | POA: Diagnosis present

## 2016-05-14 DIAGNOSIS — Z801 Family history of malignant neoplasm of trachea, bronchus and lung: Secondary | ICD-10-CM

## 2016-05-14 LAB — CBC WITH DIFFERENTIAL/PLATELET
BASOS ABS: 0 10*3/uL (ref 0.0–0.1)
Basophils Relative: 0 %
Eosinophils Absolute: 0 10*3/uL (ref 0.0–0.7)
Eosinophils Relative: 0 %
HCT: 30.2 % — ABNORMAL LOW (ref 36.0–46.0)
HEMOGLOBIN: 10.1 g/dL — AB (ref 12.0–15.0)
LYMPHS PCT: 12 %
Lymphs Abs: 1.1 10*3/uL (ref 0.7–4.0)
MCH: 31.8 pg (ref 26.0–34.0)
MCHC: 33.4 g/dL (ref 30.0–36.0)
MCV: 95 fL (ref 78.0–100.0)
Monocytes Absolute: 1.1 10*3/uL — ABNORMAL HIGH (ref 0.1–1.0)
Monocytes Relative: 12 %
Neutro Abs: 6.7 10*3/uL (ref 1.7–7.7)
Neutrophils Relative %: 76 %
Platelets: 109 10*3/uL — ABNORMAL LOW (ref 150–400)
RBC: 3.18 MIL/uL — AB (ref 3.87–5.11)
RDW: 14.3 % (ref 11.5–15.5)
WBC: 8.9 10*3/uL (ref 4.0–10.5)

## 2016-05-14 LAB — URINALYSIS, ROUTINE W REFLEX MICROSCOPIC
Bilirubin Urine: NEGATIVE
Glucose, UA: NEGATIVE mg/dL
KETONES UR: NEGATIVE mg/dL
NITRITE: POSITIVE — AB
Protein, ur: 30 mg/dL — AB
SPECIFIC GRAVITY, URINE: 1.014 (ref 1.005–1.030)
pH: 6.5 (ref 5.0–8.0)

## 2016-05-14 LAB — COMPREHENSIVE METABOLIC PANEL
ALT: 15 U/L (ref 14–54)
ANION GAP: 6 (ref 5–15)
AST: 17 U/L (ref 15–41)
Albumin: 3.2 g/dL — ABNORMAL LOW (ref 3.5–5.0)
Alkaline Phosphatase: 53 U/L (ref 38–126)
BUN: 26 mg/dL — ABNORMAL HIGH (ref 6–20)
CALCIUM: 8.8 mg/dL — AB (ref 8.9–10.3)
CO2: 25 mmol/L (ref 22–32)
Chloride: 106 mmol/L (ref 101–111)
Creatinine, Ser: 0.63 mg/dL (ref 0.44–1.00)
Glucose, Bld: 116 mg/dL — ABNORMAL HIGH (ref 65–99)
POTASSIUM: 4.2 mmol/L (ref 3.5–5.1)
SODIUM: 137 mmol/L (ref 135–145)
TOTAL PROTEIN: 5.8 g/dL — AB (ref 6.5–8.1)
Total Bilirubin: 0.6 mg/dL (ref 0.3–1.2)

## 2016-05-14 LAB — URINE MICROSCOPIC-ADD ON

## 2016-05-14 LAB — I-STAT CG4 LACTIC ACID, ED: LACTIC ACID, VENOUS: 1 mmol/L (ref 0.5–2.0)

## 2016-05-14 MED ORDER — LEVOFLOXACIN IN D5W 750 MG/150ML IV SOLN
750.0000 mg | Freq: Once | INTRAVENOUS | Status: AC
  Administered 2016-05-14: 750 mg via INTRAVENOUS
  Filled 2016-05-14: qty 150

## 2016-05-14 MED ORDER — SODIUM CHLORIDE 0.9 % IV BOLUS (SEPSIS)
1000.0000 mL | Freq: Once | INTRAVENOUS | Status: AC
  Administered 2016-05-14: 1000 mL via INTRAVENOUS

## 2016-05-14 NOTE — ED Notes (Signed)
MD at bedside. 

## 2016-05-14 NOTE — ED Provider Notes (Signed)
CSN: [id number]     Arrival date & time 05/14/16  2028 History   First MD Initiated Contact with Patient 05/14/16 2050     Chief Complaint  Patient presents with  . Code Sepsis     (Consider location/radiation/quality/duration/timing/severity/associated sxs/prior Treatment) HPI   Lethargic for last few days, temp 101.9 axillary today.  Sleeping a lot over the last 2 days, severe sleepiness. Has acted this way with prior UTIs.  No cough.  Normally rigid extremities however is alert. Lives at home with home health nurse 24hr care and 57yo husband.   Past Medical History  Diagnosis Date  . Bladder cancer [facility])     s/p cystectomy/ureterostomy  . Hypertension   . S/P appendectomy   . GERD (gastroesophageal reflux disease)   . Hyperlipidemia   . Dementia   . SVT (supraventricular tachycardia) ([place])   . Vitamin D deficiency   . Carotid stenosis   . Osteopenia   . Syncope   . Anxiety   . Cervical disc disease   . Pyelonephritis   . LBBB (left bundle branch block)   . Ventricular tachycardia ([place])   . CAD (coronary artery disease)   . PVC (premature ventricular contraction)   . Headache(784.0)   . Cervical spondylosis   . Heartburn   . Hydronephrosis   . Hypoglycemia   . Rectocele   . Microscopic hematuria   . Heart murmur   . Jejunostomy tube fell out ([place])   . Fall at home Nov. 9, 2014 and Dec. 2015    Pt was picking up her dog and fell and at home   Past Surgical History  Procedure Laterality Date  . Total abdominal hysterectomy w/ bilateral salpingoophorectomy    . Hernia repair    . Cystectomy    . Ureterostomy    . Appendectom    . Cardiac catheterization    . Fracture surgery    . Abdominal hysterectomy     Family History  Problem Relation Age of Onset  . Lung cancer Father   . Cancer Father   . Heart attack Father   . Heart disease Mother     Heart Disease before age 51  . Allergies Mother   . Hypertension Mother   . Heart attack Mother   . Heart  disease Sister   . Allergies Sister    Social History  Substance Use Topics  . Smoking status: Former Smoker -- 0.10 packs/day for 4 years    Types: Cigarettes    Quit date: 12/24/1949  . Smokeless tobacco: Never Used  . Alcohol Use: No   OB History    No data available     Review of Systems  Unable to perform ROS: Dementia      Allergies  Bethanechol chloride; Celecoxib; Citalopram hydrobromide; Clindamycin; Codeine; Desipramine; Epinephrine; Erythromycin; Floxin; Glucose; Hydromorphone hcl; Ibuprofen; Ibuprofen; Iodine; Meperidine hcl; Morphine; Paroxetine hcl; Penicillins; Procaine hcl; Propranolol hcl; Red dye; Restoril; Scopolamine; Sulfonamide derivatives; Tetracycline; Trazodone and nefazodone; and Valium  Home Medications   Prior to Admission medications   Medication Sig Start Date End Date Taking? Authorizing Provider  acetaminophen (TYLENOL) 325 MG tablet Take 2 tablets (650 mg total) by mouth every 6 (six) hours as needed for mild pain (or Fever >/= 101). 01/17/15  Yes Marton Redwood, MD  atenolol (TENORMIN) 25 MG tablet Take 25 mg by mouth 2 (two) times daily.   Yes Historical Provider, MD  Calcium Carbonate (CALTRATE 600 PO) Take 1 tablet by mouth  daily.     Yes Historical Provider, MD  cetirizine (ZYRTEC) 10 MG tablet Take 10 mg by mouth daily.   Yes Historical Provider, MD  divalproex (DEPAKOTE SPRINKLE) 125 MG capsule Take 250 mg by mouth 2 (two) times daily. 03/19/15  Yes Historical Provider, MD  Docusate Calcium (STOOL SOFTENER PO) Take 1 capsule by mouth daily as needed (constipation).    Yes Historical Provider, MD  donepezil (ARICEPT) 10 MG tablet Take 10 mg by mouth daily.     Yes Historical Provider, MD  furosemide (LASIX) 20 MG tablet Take 1 tablet by mouth 2 (two) times daily. Alternate 20mg  and 40mg  every other day. 04/12/15  Yes Historical Provider, MD  hydrocortisone (ANUSOL-HC) 2.5 % rectal cream Place rectally 2 (two) times daily. 01/18/15  Yes Marton Redwood, MD  Lansoprazole (PREVACID PO) Take 1 tablet by mouth 2 (two) times daily.   Yes Historical Provider, MD  LORazepam (ATIVAN) 0.5 MG tablet Take 1 tablet (0.5 mg total) by mouth as directed. 03/15/15  Yes Garvin Fila, MD  Memantine HCl ER (NAMENDA XR) 28 MG CP24 Take 28 mg by mouth daily.   Yes Historical Provider, MD  Multiple Vitamin (MULTIVITAMIN) capsule Take 1 capsule by mouth daily.     Yes Historical Provider, MD  nortriptyline (PAMELOR) 25 MG capsule Take 25 mg by mouth at bedtime.   Yes Historical Provider, MD  polyethylene glycol (MIRALAX / GLYCOLAX) packet Take 17 g by mouth daily as needed for mild constipation. 01/18/15  Yes Marton Redwood, MD  temazepam (RESTORIL) 15 MG capsule Take 1 capsule by mouth at bedtime. 04/12/15  Yes Historical Provider, MD  traMADol (ULTRAM) 50 MG tablet Take 25 mg by mouth every 12 (twelve) hours as needed for moderate pain.   Yes Historical Provider, MD  divalproex (DEPAKOTE) 250 MG DR tablet Take 1 tablet (250 mg total) by mouth 2 (two) times daily. Patient not taking: Reported on 04/21/2015 01/17/15   Marton Redwood, MD  nitrofurantoin (MACRODANTIN) 100 MG capsule Take 1 capsule (100 mg total) by mouth every 12 (twelve) hours. Patient not taking: Reported on 04/21/2015 01/18/15   Marton Redwood, MD   BP 138/52 mmHg  Pulse 93  Temp(Src) 101.7 F (38.7 C) (Rectal)  Resp 22  SpO2 94% Physical Exam  Constitutional: She appears well-developed and well-nourished. She appears listless. She does not appear ill. No distress.  HENT:  Head: Normocephalic and atraumatic.  Eyes: Conjunctivae and EOM are normal.  Neck: Normal range of motion.  Cardiovascular: Normal rate, regular rhythm, normal heart sounds and intact distal pulses.  Exam reveals no gallop and no friction rub.   No murmur heard. Pulmonary/Chest: Effort normal and breath sounds normal. No respiratory distress. She has no wheezes. She has no rales.  Abdominal: Soft. She exhibits no distension.  There is no tenderness. There is no guarding.  Musculoskeletal: She exhibits no edema or tenderness.  Neurological: She appears listless. GCS eye subscore is 4. GCS verbal subscore is 3. GCS motor subscore is 5.  Intermittently answers questions (says hello) Appears to move all 4 ext spontaneously and with aggravation but not following commands for formal neuro exam   Skin: Skin is warm and dry. No rash noted. She is not diaphoretic. No erythema.  Nursing note and vitals reviewed.   ED Course  Procedures (including critical care time) Labs Review Labs Reviewed  COMPREHENSIVE METABOLIC PANEL - Abnormal; Notable for the following:    Glucose, Bld 116 (*)  BUN 26 (*)    Calcium 8.8 (*)    Total Protein 5.8 (*)    Albumin 3.2 (*)    All other components within normal limits  CBC WITH DIFFERENTIAL/PLATELET - Abnormal; Notable for the following:    RBC 3.18 (*)    Hemoglobin 10.1 (*)    HCT 30.2 (*)    Platelets 109 (*)    Monocytes Absolute 1.1 (*)    All other components within normal limits  URINALYSIS, ROUTINE W REFLEX MICROSCOPIC (NOT AT [facility]) - Abnormal; Notable for the following:    APPearance CLOUDY (*)    Hgb urine dipstick TRACE (*)    Protein, ur 30 (*)    Nitrite POSITIVE (*)    Leukocytes, UA LARGE (*)    All other components within normal limits  URINE MICROSCOPIC-ADD ON - Abnormal; Notable for the following:    Squamous Epithelial / LPF 0-5 (*)    Bacteria, UA MANY (*)    All other components within normal limits  CULTURE, BLOOD (ROUTINE X 2)  CULTURE, BLOOD (ROUTINE X 2)  URINE CULTURE  I-STAT CG4 LACTIC ACID, ED  I-STAT CG4 LACTIC ACID, ED    Imaging Review Dg Chest Portable 1 View  05/14/2016  CLINICAL DATA:  Acute onset of fever. Code sepsis. Initial encounter. EXAM: PORTABLE CHEST 1 VIEW COMPARISON:  Chest radiograph performed 01/13/2015 FINDINGS: The lungs are well-aerated. Mild vascular congestion is noted, with mild bilateral atelectasis. There is no  evidence of pleural effusion or pneumothorax. The cardiomediastinal silhouette is within normal limits. No acute osseous abnormalities are seen. IMPRESSION: Mild vascular congestion, with mild bilateral atelectasis. Electronically Signed   By: Garald Balding M.D.   On: 05/14/2016 22:20   I have personally reviewed and evaluated these images and lab results as part of my medical decision-making.   EKG Interpretation   Date/Time:  Monday May 14 2016 20:40:33 EDT Ventricular Rate:  86 PR Interval:  144 QRS Duration: 138 QT Interval:  404 QTC Calculation: 483 R Axis:   -61 Text Interpretation:  Sinus rhythm with Premature atrial complexes Left  axis deviation Left bundle branch block Abnormal ECG No significant change  since last tracing Confirmed by [facility] MD, Josh Nicolosi ([id number]) on 05/14/2016  9:19:44 PM      MDM   Final diagnoses:  Somnolence  Fever, unspecified fever cause  Acute cystitis without hematuria   82 year old female with a history of bladder cancer status post cystectomy with ileal conduit, hypertension, hyperlipidemia, Alzheimer's dementia, SVT presents with concern for altered mental status and fever. Home health nurse and doesn't reports that patient has been more lethargic over the last 2 days, less responsive, and that these symptoms are consistent with prior urinary tract infections.  Patient sleepy on exam, however with otherwise normal vital signs.  Home health nurse noted that patient has rigidity at baseline, and husband and home health reported these symptoms have been consistent with prior UTIs, and have decreased suspicion for meningitis. We'll urinalysis is taken from ileal conduit, it does look consistent with urinary tract infection, and given fever, altered mental status, and urinalysis more consistent with urinary tract infection than prior, suspect UTI as etiology of patient's symptoms. No sign of pneumonia. Lactic acid within normal limits. CT head done showing  no acute abnormalities. Patient has multiple allergies to medications, and was given IV Levaquin for concern of UTI.  She will be admitted for further care.  Gareth Morgan, MD 05/15/16 [phone]

## 2016-05-14 NOTE — ED Notes (Signed)
Patient transported to CT 

## 2016-05-14 NOTE — ED Notes (Signed)
Pt DNR comes to Ed, meets code septic reviews,  Altered mental status,101.9 temp axillary, come from home/ home health called ems, left bundle branch block. 18 g left LAC 400 fluid in. cbg 144, no hx of diabetes.Vs on arrive 158/60, hr 90, rr 20-26, SP02 98 at 2 liters. responses to pain, not alert to person, time, place.  Hx of frequent, UTI pneumonia, dementia ( baseline not alert)

## 2016-05-14 NOTE — ED Notes (Signed)
Bed: [facility] Expected date:  Expected time:  Means of arrival:  Comments: 82 yr old, lethargic, fever

## 2016-05-15 ENCOUNTER — Encounter (HOSPITAL_COMMUNITY): Payer: Self-pay | Admitting: Internal Medicine

## 2016-05-15 DIAGNOSIS — Z66 Do not resuscitate: Secondary | ICD-10-CM | POA: Diagnosis present

## 2016-05-15 DIAGNOSIS — F0280 Dementia in other diseases classified elsewhere without behavioral disturbance: Secondary | ICD-10-CM | POA: Diagnosis present

## 2016-05-15 DIAGNOSIS — Z90710 Acquired absence of both cervix and uterus: Secondary | ICD-10-CM | POA: Diagnosis not present

## 2016-05-15 DIAGNOSIS — K219 Gastro-esophageal reflux disease without esophagitis: Secondary | ICD-10-CM | POA: Diagnosis present

## 2016-05-15 DIAGNOSIS — Z9181 History of falling: Secondary | ICD-10-CM | POA: Diagnosis not present

## 2016-05-15 DIAGNOSIS — G309 Alzheimer's disease, unspecified: Secondary | ICD-10-CM

## 2016-05-15 DIAGNOSIS — Z880 Allergy status to penicillin: Secondary | ICD-10-CM | POA: Diagnosis not present

## 2016-05-15 DIAGNOSIS — A4151 Sepsis due to Escherichia coli [E. coli]: Secondary | ICD-10-CM | POA: Diagnosis present

## 2016-05-15 DIAGNOSIS — I2510 Atherosclerotic heart disease of native coronary artery without angina pectoris: Secondary | ICD-10-CM

## 2016-05-15 DIAGNOSIS — Z881 Allergy status to other antibiotic agents status: Secondary | ICD-10-CM | POA: Diagnosis not present

## 2016-05-15 DIAGNOSIS — G92 Toxic encephalopathy: Secondary | ICD-10-CM | POA: Diagnosis present

## 2016-05-15 DIAGNOSIS — Z8249 Family history of ischemic heart disease and other diseases of the circulatory system: Secondary | ICD-10-CM | POA: Diagnosis not present

## 2016-05-15 DIAGNOSIS — Z888 Allergy status to other drugs, medicaments and biological substances status: Secondary | ICD-10-CM | POA: Diagnosis not present

## 2016-05-15 DIAGNOSIS — G9340 Encephalopathy, unspecified: Secondary | ICD-10-CM

## 2016-05-15 DIAGNOSIS — Z9049 Acquired absence of other specified parts of digestive tract: Secondary | ICD-10-CM | POA: Diagnosis not present

## 2016-05-15 DIAGNOSIS — R609 Edema, unspecified: Secondary | ICD-10-CM | POA: Diagnosis not present

## 2016-05-15 DIAGNOSIS — A419 Sepsis, unspecified organism: Secondary | ICD-10-CM | POA: Diagnosis not present

## 2016-05-15 DIAGNOSIS — N3000 Acute cystitis without hematuria: Secondary | ICD-10-CM | POA: Diagnosis present

## 2016-05-15 DIAGNOSIS — J690 Pneumonitis due to inhalation of food and vomit: Secondary | ICD-10-CM | POA: Diagnosis present

## 2016-05-15 DIAGNOSIS — D638 Anemia in other chronic diseases classified elsewhere: Secondary | ICD-10-CM | POA: Diagnosis present

## 2016-05-15 DIAGNOSIS — Z91041 Radiographic dye allergy status: Secondary | ICD-10-CM | POA: Diagnosis not present

## 2016-05-15 DIAGNOSIS — Z79899 Other long term (current) drug therapy: Secondary | ICD-10-CM | POA: Diagnosis not present

## 2016-05-15 DIAGNOSIS — Z936 Other artificial openings of urinary tract status: Secondary | ICD-10-CM | POA: Diagnosis not present

## 2016-05-15 DIAGNOSIS — Z7401 Bed confinement status: Secondary | ICD-10-CM | POA: Diagnosis not present

## 2016-05-15 DIAGNOSIS — E785 Hyperlipidemia, unspecified: Secondary | ICD-10-CM | POA: Diagnosis present

## 2016-05-15 DIAGNOSIS — Z8551 Personal history of malignant neoplasm of bladder: Secondary | ICD-10-CM | POA: Diagnosis not present

## 2016-05-15 DIAGNOSIS — Z906 Acquired absence of other parts of urinary tract: Secondary | ICD-10-CM | POA: Diagnosis not present

## 2016-05-15 DIAGNOSIS — Z882 Allergy status to sulfonamides status: Secondary | ICD-10-CM | POA: Diagnosis not present

## 2016-05-15 DIAGNOSIS — Z885 Allergy status to narcotic agent status: Secondary | ICD-10-CM | POA: Diagnosis not present

## 2016-05-15 DIAGNOSIS — F419 Anxiety disorder, unspecified: Secondary | ICD-10-CM | POA: Diagnosis present

## 2016-05-15 DIAGNOSIS — N390 Urinary tract infection, site not specified: Secondary | ICD-10-CM | POA: Diagnosis not present

## 2016-05-15 DIAGNOSIS — M8580 Other specified disorders of bone density and structure, unspecified site: Secondary | ICD-10-CM | POA: Diagnosis present

## 2016-05-15 DIAGNOSIS — R509 Fever, unspecified: Secondary | ICD-10-CM | POA: Diagnosis present

## 2016-05-15 DIAGNOSIS — Z87440 Personal history of urinary (tract) infections: Secondary | ICD-10-CM | POA: Diagnosis not present

## 2016-05-15 DIAGNOSIS — I447 Left bundle-branch block, unspecified: Secondary | ICD-10-CM | POA: Diagnosis present

## 2016-05-15 DIAGNOSIS — I10 Essential (primary) hypertension: Secondary | ICD-10-CM | POA: Diagnosis not present

## 2016-05-15 DIAGNOSIS — Z87891 Personal history of nicotine dependence: Secondary | ICD-10-CM | POA: Diagnosis not present

## 2016-05-15 DIAGNOSIS — R4182 Altered mental status, unspecified: Secondary | ICD-10-CM | POA: Diagnosis present

## 2016-05-15 DIAGNOSIS — Z886 Allergy status to analgesic agent status: Secondary | ICD-10-CM | POA: Diagnosis not present

## 2016-05-15 DIAGNOSIS — Z801 Family history of malignant neoplasm of trachea, bronchus and lung: Secondary | ICD-10-CM | POA: Diagnosis not present

## 2016-05-15 LAB — BLOOD CULTURE ID PANEL (REFLEXED)
ACINETOBACTER BAUMANNII: NOT DETECTED
CANDIDA GLABRATA: NOT DETECTED
CANDIDA KRUSEI: NOT DETECTED
CANDIDA PARAPSILOSIS: NOT DETECTED
CANDIDA TROPICALIS: NOT DETECTED
CARBAPENEM RESISTANCE: NOT DETECTED
Candida albicans: NOT DETECTED
ESCHERICHIA COLI: DETECTED — AB
Enterobacter cloacae complex: NOT DETECTED
Enterobacteriaceae species: DETECTED — AB
Enterococcus species: NOT DETECTED
Haemophilus influenzae: NOT DETECTED
KLEBSIELLA OXYTOCA: NOT DETECTED
Klebsiella pneumoniae: NOT DETECTED
LISTERIA MONOCYTOGENES: NOT DETECTED
Methicillin resistance: NOT DETECTED
NEISSERIA MENINGITIDIS: NOT DETECTED
PROTEUS SPECIES: NOT DETECTED
Pseudomonas aeruginosa: NOT DETECTED
SERRATIA MARCESCENS: NOT DETECTED
STAPHYLOCOCCUS SPECIES: NOT DETECTED
STREPTOCOCCUS SPECIES: NOT DETECTED
Staphylococcus aureus (BCID): NOT DETECTED
Streptococcus agalactiae: NOT DETECTED
Streptococcus pneumoniae: NOT DETECTED
Streptococcus pyogenes: NOT DETECTED
Vancomycin resistance: NOT DETECTED

## 2016-05-15 LAB — CBC
HEMATOCRIT: 32.7 % — AB (ref 36.0–46.0)
Hemoglobin: 10.8 g/dL — ABNORMAL LOW (ref 12.0–15.0)
MCH: 32.7 pg (ref 26.0–34.0)
MCHC: 33 g/dL (ref 30.0–36.0)
MCV: 99.1 fL (ref 78.0–100.0)
PLATELETS: 101 10*3/uL — AB (ref 150–400)
RBC: 3.3 MIL/uL — ABNORMAL LOW (ref 3.87–5.11)
RDW: 14.9 % (ref 11.5–15.5)
WBC: 9.4 10*3/uL (ref 4.0–10.5)

## 2016-05-15 LAB — COMPREHENSIVE METABOLIC PANEL
ALBUMIN: 3.1 g/dL — AB (ref 3.5–5.0)
ALT: 17 U/L (ref 14–54)
AST: 19 U/L (ref 15–41)
Alkaline Phosphatase: 47 U/L (ref 38–126)
Anion gap: 5 (ref 5–15)
BILIRUBIN TOTAL: 0.5 mg/dL (ref 0.3–1.2)
BUN: 24 mg/dL — AB (ref 6–20)
CHLORIDE: 107 mmol/L (ref 101–111)
CO2: 24 mmol/L (ref 22–32)
CREATININE: 0.6 mg/dL (ref 0.44–1.00)
Calcium: 8.7 mg/dL — ABNORMAL LOW (ref 8.9–10.3)
GFR calc Af Amer: >60 mL/min (ref >60)
GLUCOSE: 115 mg/dL — AB (ref 65–99)
Potassium: 4.1 mmol/L (ref 3.5–5.1)
Sodium: 136 mmol/L (ref 135–145)
TOTAL PROTEIN: 5.7 g/dL — AB (ref 6.5–8.1)

## 2016-05-15 LAB — I-STAT CG4 LACTIC ACID, ED: LACTIC ACID, VENOUS: 1.13 mmol/L (ref 0.5–2.0)

## 2016-05-15 LAB — TSH: TSH: 1.518 u[IU]/mL (ref 0.350–4.500)

## 2016-05-15 LAB — MAGNESIUM: Magnesium: 1.8 mg/dL (ref 1.7–2.4)

## 2016-05-15 LAB — PROCALCITONIN: Procalcitonin: 0.99 ng/mL

## 2016-05-15 LAB — PHOSPHORUS: Phosphorus: 2.1 mg/dL — ABNORMAL LOW (ref 2.5–4.6)

## 2016-05-15 MED ORDER — ALBUTEROL SULFATE (2.5 MG/3ML) 0.083% IN NEBU: 2.5000 mg | INHALATION_SOLUTION | RESPIRATORY_TRACT | Status: DC | PRN

## 2016-05-15 MED ORDER — ONDANSETRON HCL 4 MG/2ML IJ SOLN
4.0000 mg | Freq: Four times a day (QID) | INTRAMUSCULAR | Status: DC | PRN
Start: 2016-05-15 — End: 2016-05-23

## 2016-05-15 MED ORDER — DIVALPROEX SODIUM 125 MG PO CSDR
250.0000 mg | DELAYED_RELEASE_CAPSULE | Freq: Two times a day (BID) | ORAL | Status: DC
  Administered 2016-05-15 – 2016-05-23 (×18): 250 mg via ORAL
  Filled 2016-05-15 (×20): qty 2

## 2016-05-15 MED ORDER — PANTOPRAZOLE SODIUM 20 MG PO TBEC
20.0000 mg | DELAYED_RELEASE_TABLET | Freq: Every day | ORAL | Status: DC
  Administered 2016-05-15 – 2016-05-23 (×8): 20 mg via ORAL
  Filled 2016-05-15 (×9): qty 1

## 2016-05-15 MED ORDER — AZTREONAM 2 G IJ SOLR
2.0000 g | Freq: Once | INTRAMUSCULAR | Status: AC
  Administered 2016-05-15: 2 g via INTRAVENOUS
  Filled 2016-05-15: qty 2

## 2016-05-15 MED ORDER — ENOXAPARIN SODIUM 40 MG/0.4ML ~~LOC~~ SOLN
40.0000 mg | SUBCUTANEOUS | Status: DC
  Administered 2016-05-15 – 2016-05-22 (×8): 40 mg via SUBCUTANEOUS
  Filled 2016-05-15 (×8): qty 0.4

## 2016-05-15 MED ORDER — NORTRIPTYLINE HCL 25 MG PO CAPS
25.0000 mg | ORAL_CAPSULE | Freq: Every day | ORAL | Status: DC
  Administered 2016-05-15 – 2016-05-22 (×7): 25 mg via ORAL
  Filled 2016-05-15 (×10): qty 1

## 2016-05-15 MED ORDER — SODIUM CHLORIDE 0.9 % IV BOLUS (SEPSIS)
1000.0000 mL | Freq: Once | INTRAVENOUS | Status: AC
  Administered 2016-05-15: 1000 mL via INTRAVENOUS

## 2016-05-15 MED ORDER — DEXTROSE 5 % IV SOLN
1.0000 g | Freq: Three times a day (TID) | INTRAVENOUS | Status: DC
  Administered 2016-05-15 – 2016-05-17 (×6): 1 g via INTRAVENOUS
  Filled 2016-05-15 (×7): qty 1

## 2016-05-15 MED ORDER — DOCUSATE SODIUM 100 MG PO CAPS: 100.0000 mg | ORAL_CAPSULE | Freq: Every day | ORAL | Status: DC | PRN

## 2016-05-15 MED ORDER — LEVOFLOXACIN IN D5W 750 MG/150ML IV SOLN: 750.0000 mg | INTRAVENOUS | Status: DC

## 2016-05-15 MED ORDER — SODIUM CHLORIDE 0.9 % IV SOLN
INTRAVENOUS | Status: AC
  Administered 2016-05-15: 03:00:00 via INTRAVENOUS

## 2016-05-15 MED ORDER — TEMAZEPAM 15 MG PO CAPS
15.0000 mg | ORAL_CAPSULE | Freq: Every day | ORAL | Status: DC
Start: 2016-05-15 — End: 2016-05-18
  Administered 2016-05-15 – 2016-05-17 (×3): 15 mg via ORAL
  Filled 2016-05-15 (×3): qty 1

## 2016-05-15 MED ORDER — TRAMADOL HCL 50 MG PO TABS
25.0000 mg | ORAL_TABLET | Freq: Two times a day (BID) | ORAL | Status: DC | PRN
  Administered 2016-05-15: 25 mg via ORAL
  Filled 2016-05-15 (×2): qty 1

## 2016-05-15 MED ORDER — ACETAMINOPHEN 650 MG RE SUPP
650.0000 mg | Freq: Four times a day (QID) | RECTAL | Status: DC | PRN
  Administered 2016-05-16 (×2): 650 mg via RECTAL
  Filled 2016-05-15 (×3): qty 1

## 2016-05-15 MED ORDER — DONEPEZIL HCL 10 MG PO TABS
10.0000 mg | ORAL_TABLET | Freq: Every day | ORAL | Status: DC
  Administered 2016-05-15 – 2016-05-23 (×9): 10 mg via ORAL
  Filled 2016-05-15 (×9): qty 1

## 2016-05-15 MED ORDER — LORATADINE 10 MG PO TABS
10.0000 mg | ORAL_TABLET | Freq: Every day | ORAL | Status: DC
  Administered 2016-05-15 – 2016-05-18 (×4): 10 mg via ORAL
  Filled 2016-05-15 (×4): qty 1

## 2016-05-15 MED ORDER — MEMANTINE HCL ER 28 MG PO CP24
28.0000 mg | ORAL_CAPSULE | Freq: Every day | ORAL | Status: DC
  Administered 2016-05-15 – 2016-05-23 (×9): 28 mg via ORAL
  Filled 2016-05-15 (×9): qty 1

## 2016-05-15 MED ORDER — ACETAMINOPHEN 325 MG PO TABS
650.0000 mg | ORAL_TABLET | Freq: Four times a day (QID) | ORAL | Status: DC | PRN
  Administered 2016-05-15 – 2016-05-22 (×6): 650 mg via ORAL
  Filled 2016-05-15 (×6): qty 2

## 2016-05-15 MED ORDER — ONDANSETRON HCL 4 MG PO TABS: 4.0000 mg | ORAL_TABLET | Freq: Four times a day (QID) | ORAL | Status: DC | PRN

## 2016-05-15 MED ORDER — LORAZEPAM 0.5 MG PO TABS: 0.5000 mg | ORAL_TABLET | Freq: Two times a day (BID) | ORAL | Status: DC | PRN

## 2016-05-15 MED ORDER — POLYETHYLENE GLYCOL 3350 17 G PO PACK: 17.0000 g | PACK | Freq: Every day | ORAL | Status: DC | PRN

## 2016-05-15 MED ORDER — KETOROLAC TROMETHAMINE 10 MG PO TABS
10.0000 mg | ORAL_TABLET | Freq: Once | ORAL | Status: AC
  Administered 2016-05-15: 10 mg via ORAL
  Filled 2016-05-15: qty 1

## 2016-05-15 NOTE — Progress Notes (Signed)
PHARMACY - PHYSICIAN COMMUNICATION CRITICAL VALUE ALERT - BLOOD CULTURE IDENTIFICATION (BCID)  Results for orders placed or performed during the hospital encounter of 05/14/16  Blood Culture ID Panel (Reflexed) (Collected: 05/14/2016  9:09 PM)  Result Value Ref Range   Enterococcus species NOT DETECTED NOT DETECTED   Vancomycin resistance NOT DETECTED NOT DETECTED   Listeria monocytogenes NOT DETECTED NOT DETECTED   Staphylococcus species NOT DETECTED NOT DETECTED   Staphylococcus aureus NOT DETECTED NOT DETECTED   Methicillin resistance NOT DETECTED NOT DETECTED   Streptococcus species NOT DETECTED NOT DETECTED   Streptococcus agalactiae NOT DETECTED NOT DETECTED   Streptococcus pneumoniae NOT DETECTED NOT DETECTED   Streptococcus pyogenes NOT DETECTED NOT DETECTED   Acinetobacter baumannii NOT DETECTED NOT DETECTED   Enterobacteriaceae species DETECTED (A) NOT DETECTED   Enterobacter cloacae complex NOT DETECTED NOT DETECTED   Escherichia coli DETECTED (A) NOT DETECTED   Klebsiella oxytoca NOT DETECTED NOT DETECTED   Klebsiella pneumoniae NOT DETECTED NOT DETECTED   Proteus species NOT DETECTED NOT DETECTED   Serratia marcescens NOT DETECTED NOT DETECTED   Carbapenem resistance NOT DETECTED NOT DETECTED   Haemophilus influenzae NOT DETECTED NOT DETECTED   Neisseria meningitidis NOT DETECTED NOT DETECTED   Pseudomonas aeruginosa NOT DETECTED NOT DETECTED   Candida albicans NOT DETECTED NOT DETECTED   Candida glabrata NOT DETECTED NOT DETECTED   Candida krusei NOT DETECTED NOT DETECTED   Candida parapsilosis NOT DETECTED NOT DETECTED   Candida tropicalis NOT DETECTED NOT DETECTED    Name of physician (or Provider) Contacted: Broadus John, P  Changes to prescribed antibiotics required: continue aztreonam and d/c levaquin.  Pt has anaphylaxis to PCN and no hx of beta lactams in [place] 05/15/2016, 1:18 PM Pager [phone]

## 2016-05-15 NOTE — Progress Notes (Signed)
Will pass on to night RN to recheck temp. Toradol given at this time.

## 2016-05-15 NOTE — H&P (Addendum)
[name] [id number] DOB: [birth year] DOA: 05/14/2016    PCP: Marton Redwood, MD   Outpatient Specialists: Neurology Leonie Man , Pulmonology Clance Patient coming from: home  Chief Complaint: Lethargy and fever  HPI: [name] is a 82 y.o. female with medical history significant of falls Alzheimer's disease, bladder cancer status post urostomy, and recurrent UTI    Presented with fever up to 101.9 Patient has chronic dementia at her baseline she is not fully oriented but usually can answer and sentences. She lives with her elderly husband. Private nurse noted the patient has been lethargic not eating and drinking as much febrile at home. His been going on for past 24 hours or so.   Family states the patient becomes confused when she developed urinary tract infection She has urostomy secondary to history of bladder cancer has recurrent urinary tract infection secondary to that Patient has known history of nonobstructive coronary artery disease but has been stable  IN ER: meeting sepsis criteria and lethargic temperature up to 101.9, initially hypotensive down to 99/49, heart rate up to 93 respirations of 22.  WBC 8.9, Hg 10.1. Lactic acid 1.0 UA showing evidence of UTI CT head   nonacute Chest x-ray show mild vascular congestion bilaterally atelectasis  Hospitalist was called for admission for sepsis due to UTI  Review of Systems:    Pertinent positives include: Fevers, chills, fatigue, confusion  Constitutional:  No weight loss, night sweats, weight loss  HEENT:  No headaches, Difficulty swallowing,Tooth/dental problems,Sore throat,  No sneezing, itching, ear ache, nasal congestion, post nasal drip,  Cardio-vascular:  No chest pain, Orthopnea, PND, anasarca, dizziness, palpitations.no Bilateral lower extremity swelling  GI:  No heartburn, indigestion, abdominal pain, nausea, vomiting, diarrhea, change in bowel habits, loss of appetite, melena, blood in stool,  hematemesis Resp:  no shortness of breath at rest. No dyspnea on exertion, No excess mucus, no productive cough, No non-productive cough, No coughing up of blood.No change in color of mucus.No wheezing. Skin:  no rash or lesions. No jaundice GU:  no dysuria, change in color of urine, no urgency or frequency. No straining to urinate.  No flank pain.  Musculoskeletal:  No joint pain or no joint swelling. No decreased range of motion. No back pain.  Psych:  No change in mood or affect. No depression or anxiety. No memory loss.  Neuro: no localizing neurological complaints, no tingling, no weakness, no double vision, no gait abnormality, no slurred speech, no   As per HPI otherwise 10 point review of systems negative.   Past Medical History: Past Medical History  Diagnosis Date  . Bladder cancer [facility])     s/p cystectomy/ureterostomy  . Hypertension   . S/P appendectomy   . GERD (gastroesophageal reflux disease)   . Hyperlipidemia   . Dementia   . SVT (supraventricular tachycardia) ([place])   . Vitamin D deficiency   . Carotid stenosis   . Osteopenia   . Syncope   . Anxiety   . Cervical disc disease   . Pyelonephritis   . LBBB (left bundle branch block)   . Ventricular tachycardia ([place])   . CAD (coronary artery disease)   . PVC (premature ventricular contraction)   . Headache(784.0)   . Cervical spondylosis   . Heartburn   . Hydronephrosis   . Hypoglycemia   . Rectocele   . Microscopic hematuria   . Heart murmur   . Jejunostomy tube fell out ([place])   . Fall at home  Nov. 9, 2014 and Dec. 2015    Pt was picking up her dog and fell and at home   Past Surgical History  Procedure Laterality Date  . Total abdominal hysterectomy w/ bilateral salpingoophorectomy    . Hernia repair    . Cystectomy    . Ureterostomy    . Appendectom    . Cardiac catheterization    . Fracture surgery    . Abdominal hysterectomy       Social History:  Ambulatory walker  Lives at home   With family Home health nurse and caregiver     reports that she quit smoking about 66 years ago. Her smoking use included Cigarettes. She has a .4 pack-year smoking history. She has never used smokeless tobacco. She reports that she does not drink alcohol or use illicit drugs.  Allergies:   Allergies  Allergen Reactions  . Bethanechol Chloride     Chest pains. Coronary spasms  . Celecoxib     Celebrex.  Severe itching.   . Citalopram Hydrobromide     Hyper, nervousness  . Clindamycin     Itching, coughing  . Codeine     tachycardia  . Desipramine     Severe tachycardia  . Epinephrine     Severe shortness of breath.   . Erythromycin     Burning, itching  . Floxin [Ocuflox]     itching  . Glucose     Sugar.  Weakness,faintness  . Hydromorphone Hcl     Dilaudid.  Comatose-Anaphylactic reaction.   . Ibuprofen     Tachycardia. weakness  . Ibuprofen     Tachycardia, weakness  . Iodine     Hives, edema, tachycardia  . Meperidine Hcl     Demerol.  Tachycardia, extreme nervousness.   . Morphine     Tachycardia, hallucinations, extreme nervousness.  . Paroxetine Hcl     Extreme nervousness  . Penicillins     Severe edema, severe difficulty breathing, anaphylactic reaction.  . Procaine Hcl     Novocain. Severe shortness of breath.  . Propranolol Hcl     Inderal.  Extreme low blood pressure.   . Red Dye     Severe itching  . Restoril     Temazepam.  Extreme nervousness, sleeplessness  . Scopolamine     Irrational, loss of memory  . Sulfonamide Derivatives     Hives, edema  . Tetracycline     Severe hives, edema  . Trazodone And Nefazodone     Very hyper  . Valium     itching       Family History:    Family History  Problem Relation Age of Onset  . Lung cancer Father   . Cancer Father   . Heart attack Father   . Heart disease Mother     Heart Disease before age 66  . Allergies Mother   . Hypertension Mother   . Heart attack Mother   . Heart disease  Sister   . Allergies Sister     Medications: Prior to Admission medications   Medication Sig Start Date End Date Taking? Authorizing Provider  acetaminophen (TYLENOL) 325 MG tablet Take 2 tablets (650 mg total) by mouth every 6 (six) hours as needed for mild pain (or Fever >/= 101). 01/17/15  Yes Marton Redwood, MD  atenolol (TENORMIN) 25 MG tablet Take 25 mg by mouth 2 (two) times daily.   Yes Historical Provider, MD  Calcium Carbonate (CALTRATE 600 PO) Take 1  tablet by mouth daily.     Yes Historical Provider, MD  cetirizine (ZYRTEC) 10 MG tablet Take 10 mg by mouth daily.   Yes Historical Provider, MD  divalproex (DEPAKOTE SPRINKLE) 125 MG capsule Take 250 mg by mouth 2 (two) times daily. 03/19/15  Yes Historical Provider, MD  Docusate Calcium (STOOL SOFTENER PO) Take 1 capsule by mouth daily as needed (constipation).    Yes Historical Provider, MD  donepezil (ARICEPT) 10 MG tablet Take 10 mg by mouth daily.     Yes Historical Provider, MD  furosemide (LASIX) 20 MG tablet Take 1 tablet by mouth 2 (two) times daily. Alternate 20mg  and 40mg  every other day. 04/12/15  Yes Historical Provider, MD  hydrocortisone (ANUSOL-HC) 2.5 % rectal cream Place rectally 2 (two) times daily. 01/18/15  Yes Marton Redwood, MD  Lansoprazole (PREVACID PO) Take 1 tablet by mouth 2 (two) times daily.   Yes Historical Provider, MD  LORazepam (ATIVAN) 0.5 MG tablet Take 1 tablet (0.5 mg total) by mouth as directed. 03/15/15  Yes Garvin Fila, MD  Memantine HCl ER (NAMENDA XR) 28 MG CP24 Take 28 mg by mouth daily.   Yes Historical Provider, MD  Multiple Vitamin (MULTIVITAMIN) capsule Take 1 capsule by mouth daily.     Yes Historical Provider, MD  nortriptyline (PAMELOR) 25 MG capsule Take 25 mg by mouth at bedtime.   Yes Historical Provider, MD  polyethylene glycol (MIRALAX / GLYCOLAX) packet Take 17 g by mouth daily as needed for mild constipation. 01/18/15  Yes Marton Redwood, MD  temazepam (RESTORIL) 15 MG capsule Take 1  capsule by mouth at bedtime. 04/12/15  Yes Historical Provider, MD  traMADol (ULTRAM) 50 MG tablet Take 25 mg by mouth every 12 (twelve) hours as needed for moderate pain.   Yes Historical Provider, MD  divalproex (DEPAKOTE) 250 MG DR tablet Take 1 tablet (250 mg total) by mouth 2 (two) times daily. Patient not taking: Reported on 04/21/2015 01/17/15   Marton Redwood, MD  nitrofurantoin (MACRODANTIN) 100 MG capsule Take 1 capsule (100 mg total) by mouth every 12 (twelve) hours. Patient not taking: Reported on 04/21/2015 01/18/15   Marton Redwood, MD    Physical Exam: Patient Vitals for the past 24 hrs:  BP Temp Temp src Pulse Resp SpO2  05/15/16 0113 - 100.8 F (38.2 C) Rectal - - -  05/14/16 2315 (!) 138/52 mmHg - - 93 22 94 %  05/14/16 2230 (!) 99/49 mmHg - - 81 13 96 %  05/14/16 2200 (!) 114/39 mmHg - - 80 13 100 %  05/14/16 2130 135/57 mmHg - - 83 16 99 %  05/14/16 2046 (!) 124/44 mmHg 101.7 F (38.7 C) Rectal 84 15 94 %    1. General:  in No Acute distress 2. Psychological: Alert not  Oriented 3. Head/ENT:    Dry Mucous Membranes                          Head Non traumatic, neck supple                            Poor Dentition 4. SKIN:  decreased Skin turgor,  Skin clean Dry and intact no rash 5. Heart: Regular rate and rhythm no  Murmur, Rub or gallop 6. Lungs:   no wheezes or crackles   7. Abdomen: Soft, non-tender, Non distended, urostomy in place 8. Lower extremities: no clubbing,  cyanosis, 1+ edema 9. Neurologically Grossly intact, moving all 4 extremities equally 10. MSK: Normal range of motion   body mass index is unknown because there is no weight on file.  Labs on Admission:   Labs on Admission: I have personally reviewed following labs and imaging studies  CBC:  Recent Labs Lab 05/14/16 2109  WBC 8.9  NEUTROABS 6.7  HGB 10.1*  HCT 30.2*  MCV 95.0  PLT 0000000*   Basic Metabolic Panel:  Recent Labs Lab 05/14/16 2109  NA 137  K 4.2  CL 106  CO2 25    GLUCOSE 116*  BUN 26*  CREATININE 0.63  CALCIUM 8.8*   GFR: CrCl cannot be calculated (Unknown ideal weight.). Liver Function Tests:  Recent Labs Lab 05/14/16 2109  AST 17  ALT 15  ALKPHOS 53  BILITOT 0.6  PROT 5.8*  ALBUMIN 3.2*   No results for input(s): LIPASE, AMYLASE in the last 168 hours. No results for input(s): AMMONIA in the last 168 hours. Coagulation Profile: No results for input(s): INR, PROTIME in the last 168 hours. Cardiac Enzymes: No results for input(s): CKTOTAL, CKMB, CKMBINDEX, TROPONINI in the last 168 hours. BNP (last 3 results) No results for input(s): PROBNP in the last 8760 hours. HbA1C: No results for input(s): HGBA1C in the last 72 hours. CBG: No results for input(s): GLUCAP in the last 168 hours. Lipid Profile: No results for input(s): CHOL, HDL, LDLCALC, TRIG, CHOLHDL, LDLDIRECT in the last 72 hours. Thyroid Function Tests: No results for input(s): TSH, T4TOTAL, FREET4, T3FREE, THYROIDAB in the last 72 hours. Anemia Panel: No results for input(s): VITAMINB12, FOLATE, FERRITIN, TIBC, IRON, RETICCTPCT in the last 72 hours. Urine analysis:    Component Value Date/Time   COLORURINE YELLOW 05/14/2016 2314   APPEARANCEUR CLOUDY* 05/14/2016 2314   LABSPEC 1.014 05/14/2016 2314   PHURINE 6.5 05/14/2016 2314   GLUCOSEU NEGATIVE 05/14/2016 2314   HGBUR TRACE* 05/14/2016 2314   BILIRUBINUR NEGATIVE 05/14/2016 [place] 05/14/2016 2314   PROTEINUR 30* 05/14/2016 2314   UROBILINOGEN 0.2 04/21/2015 1228   NITRITE POSITIVE* 05/14/2016 2314   LEUKOCYTESUR LARGE* 05/14/2016 2314   Sepsis Labs: @LABRCNTIP (procalcitonin:4,lacticidven:4) )No results found for this or any previous visit (from the past 240 hour(s)).     UA  evidence of UTI  No results found for: HGBA1C  CrCl cannot be calculated (Unknown ideal weight.).  BNP (last 3 results) No results for input(s): PROBNP in the last 8760 hours.   ECG REPORT  Independently  reviewed Rate:86  Rhythm: LBBB ST&T Change: N/A QTC 486  There were no vitals filed for this visit.   Cultures:    Component Value Date/Time   SDES URINE, CATHETERIZED 01/13/2015 1600   SPECREQUEST NONE 01/13/2015 1600   CULT  01/13/2015 1600    ESCHERICHIA COLI Performed at [place] 01/16/2015 FINAL 01/13/2015 1600     Radiological Exams on Admission: Ct Head Wo Contrast  05/15/2016  CLINICAL DATA:  Initial evaluation for acute altered mental status. EXAM: CT HEAD WITHOUT CONTRAST TECHNIQUE: Contiguous axial images were obtained from the base of the skull through the vertex without intravenous contrast. COMPARISON:  Prior study from 04/21/2015. FINDINGS: Generalized age-related cerebral atrophy with chronic small vessel ischemic disease present, similar to prior. Remote lacunar infarcts within the bilateral basal ganglia, also stable. Scattered vascular calcifications within the carotid siphons. No acute intracranial hemorrhage. No acute large vessel territory infarct. No mass lesion, midline shift or mass  effect. Ventricular prominence related to global parenchymal volume loss without hydrocephalus, stable. No extra-axial fluid collection. Scalp soft tissues within normal limits. No acute abnormality about the globes and orbits. Paranasal sinuses are clear.  No mastoid effusion. Calvarium intact. IMPRESSION: 1. No acute intracranial process. 2. Stable atrophy with chronic small vessel ischemic disease. 3. Remote lacunar infarcts within the bilateral basal ganglia, stable. Electronically Signed   By: Jeannine Boga M.D.   On: 05/15/2016 01:05   Dg Chest Portable 1 View  05/14/2016  CLINICAL DATA:  Acute onset of fever. Code sepsis. Initial encounter. EXAM: PORTABLE CHEST 1 VIEW COMPARISON:  Chest radiograph performed 01/13/2015 FINDINGS: The lungs are well-aerated. Mild vascular congestion is noted, with mild bilateral atelectasis. There is no evidence of  pleural effusion or pneumothorax. The cardiomediastinal silhouette is within normal limits. No acute osseous abnormalities are seen. IMPRESSION: Mild vascular congestion, with mild bilateral atelectasis. Electronically Signed   By: Garald Balding M.D.   On: 05/14/2016 22:20    Chart has been reviewed    Assessment/Plan  82 y.o. female with medical history significant of falls Alzheimer's disease, nonobstructive coronary disease,  bladder cancer status post urostomy, and recurrent UTI here within of a UTI causing sepsis   Present on Admission:  . Sepsis ([place]) - Admit per Sepsis protocol likely source being   UTI   - rehydrate with 19ml/kg  - initiate broad spectrum antibiotics Levaquin, aztreonam  -  obtain blood cultures  - Obtain serial lactic acid  - Obtain procalcitonin level  - Admit and monitor vital signs       . Alzheimer's dementia - stable anticipate some degree of sundowning while hospitalized  . UTI (lower urinary tract infection) she has multiple allergies to treat with Levaquin await results of urine culture  . Hypertension and transient hypotension will hold medications  . Acute encephalopathy - most likely secondary to UTI and sepsis treat supportively   . CAD (coronary artery disease) stable continue home medications Other plan as per orders.  DVT prophylaxis:    Lovenox     Code Status:  DNR/DNI  as per patient  family   Family Communication:   Family not at  Bedside  plan of care was discussed with private nurse  Disposition Plan:    To home once workup is complete and patient is stable   Consults called: none    Admission status: inpatient     Level of care    medical floor      I have spent a total of 57 min on this admission    Cayley Pester 05/15/2016, 1:58 AM    [facility]  Pager [phone]   after 2 AM please page floor coverage PA If 7AM-7PM, please contact the day team taking care of the patient  [email or web address]  Password  [email or web address]

## 2016-05-15 NOTE — Progress Notes (Signed)
Patient had a temp of 101.4- given PRN tylenol. Temp has increased to 103.1- MD [name] paged with concern. Awaiting any further orders.

## 2016-05-15 NOTE — Progress Notes (Addendum)
Pt seen and examined, admitted earlier this am by Dr.Doutova 89/F with Advanced Dementia, total care from home with 24x7 private nursing care, admitted with lethargy, fevers, sepsis from suspected UTI Continue IVF, Aztreonam, has PCN allergy, D/w her private RN about whether Hospice was ever considered, she tells me that spouse is adamantly against Hospice care.  Domenic Polite, MD

## 2016-05-15 NOTE — Progress Notes (Signed)
Caregiver asked that patient is not turned q2 but rather q4 because patient hates being moved so often.

## 2016-05-15 NOTE — Progress Notes (Signed)
Pharmacy Antibiotic Note  [name] is a 82 y.o. female admitted on 05/14/2016 with UTI.  Pharmacy has been consulted for levaquin/azactam dosing.  Plan: Azactam 2Gm x1 then 1Gm IV q8h Levaquin 750mg  IV q48h  Height: 5\' 5"  (165.1 cm) Weight: 129 lb 9.6 oz (58.786 kg) IBW/kg (Calculated) : 57  Temp (24hrs), Avg:100.8 F (38.2 C), Min:100 F (37.8 C), Max:101.7 F (38.7 C)   Recent Labs Lab 05/14/16 2109 05/14/16 2132 05/15/16 0049  WBC 8.9  --   --   CREATININE 0.63  --   --   LATICACIDVEN  --  1.00 1.13    Estimated Creatinine Clearance: 44.6 mL/min (by C-G formula based on Cr of 0.63).    Allergies  Allergen Reactions  . Bethanechol Chloride     Chest pains. Coronary spasms  . Celecoxib     Celebrex.  Severe itching.   . Citalopram Hydrobromide     Hyper, nervousness  . Clindamycin     Itching, coughing  . Codeine     tachycardia  . Desipramine     Severe tachycardia  . Epinephrine     Severe shortness of breath.   . Erythromycin     Burning, itching  . Floxin [Ocuflox]     itching  . Glucose     Sugar.  Weakness,faintness  . Hydromorphone Hcl     Dilaudid.  Comatose-Anaphylactic reaction.   . Ibuprofen     Tachycardia. weakness  . Ibuprofen     Tachycardia, weakness  . Iodine     Hives, edema, tachycardia  . Meperidine Hcl     Demerol.  Tachycardia, extreme nervousness.   . Morphine     Tachycardia, hallucinations, extreme nervousness.  . Paroxetine Hcl     Extreme nervousness  . Penicillins     Severe edema, severe difficulty breathing, anaphylactic reaction.  . Procaine Hcl     Novocain. Severe shortness of breath.  . Propranolol Hcl     Inderal.  Extreme low blood pressure.   . Red Dye     Severe itching  . Restoril     Temazepam.  Extreme nervousness, sleeplessness  . Scopolamine     Irrational, loss of memory  . Sulfonamide Derivatives     Hives, edema  . Tetracycline     Severe hives, edema  . Trazodone And Nefazodone     Very hyper  . Valium     itching    Antimicrobials this admission: 5/22 levaquin >>  5/23 azactam >>   Dose adjustments this admission:   Microbiology results:  BCx:   UCx:    Sputum:    MRSA PCR:   Thank you for allowing pharmacy to be a part of this patient's care.  Dorrene German 05/15/2016 3:25 AM

## 2016-05-15 NOTE — Progress Notes (Signed)
RN spoke with caregivers about urostomy care and changing the bag since it looked dirty- but both stated that it was recently changed and did not need to be replaced.

## 2016-05-15 NOTE — Progress Notes (Signed)
Toradol ordered after making MD aware of patient's fever this evening.

## 2016-05-16 LAB — BASIC METABOLIC PANEL
ANION GAP: 5 (ref 5–15)
BUN: 20 mg/dL (ref 6–20)
CALCIUM: 8.8 mg/dL — AB (ref 8.9–10.3)
CHLORIDE: 110 mmol/L (ref 101–111)
CO2: 23 mmol/L (ref 22–32)
CREATININE: 0.57 mg/dL (ref 0.44–1.00)
GFR calc Af Amer: >60 mL/min (ref >60)
GFR calc non Af Amer: >60 mL/min (ref >60)
GLUCOSE: 106 mg/dL — AB (ref 65–99)
Potassium: 3.9 mmol/L (ref 3.5–5.1)
Sodium: 138 mmol/L (ref 135–145)

## 2016-05-16 LAB — URINE CULTURE

## 2016-05-16 LAB — PHOSPHORUS: Phosphorus: 1.7 mg/dL — ABNORMAL LOW (ref 2.5–4.6)

## 2016-05-16 MED ORDER — SODIUM CHLORIDE 0.9 % IV SOLN
Freq: Once | INTRAVENOUS | Status: AC
  Administered 2016-05-16: 08:00:00 via INTRAVENOUS

## 2016-05-16 MED ORDER — SODIUM CHLORIDE 0.9 % IV SOLN
INTRAVENOUS | Status: DC
  Administered 2016-05-16 – 2016-05-17 (×2): via INTRAVENOUS

## 2016-05-16 MED ORDER — K PHOS MONO-SOD PHOS DI & MONO 155-852-130 MG PO TABS
250.0000 mg | ORAL_TABLET | Freq: Two times a day (BID) | ORAL | Status: DC
  Administered 2016-05-16 (×2): 250 mg via ORAL
  Filled 2016-05-16 (×4): qty 1

## 2016-05-16 MED ORDER — POLYETHYLENE GLYCOL 3350 17 G PO PACK
17.0000 g | PACK | Freq: Every day | ORAL | Status: DC
  Administered 2016-05-16 – 2016-05-23 (×7): 17 g via ORAL
  Filled 2016-05-16 (×8): qty 1

## 2016-05-16 MED ORDER — ENSURE ENLIVE PO LIQD
237.0000 mL | Freq: Two times a day (BID) | ORAL | Status: DC
  Administered 2016-05-16 – 2016-05-23 (×7): 237 mL via ORAL

## 2016-05-16 NOTE — Care Management Note (Addendum)
Case Management Note  Patient Details  Name: CAMBELLE SUCHECKI MRN: [id number] Date of Birth: 03-03-1926  Subjective/Objective:    82 yo admitted with CAD                Action/Plan: From home with husband and 24hr caregivers.    Expected Discharge Date:                  Expected Discharge Plan:  [place]  In-House Referral:     Discharge planning Services  CM Consult  Post Acute Care Choice:    Choice offered to:  Spouse  DME Arranged:    DME Agency:     HH Arranged:  OT, PT [place] Agency:     Status of Service:  In process, will continue to follow  Medicare Important Message Given:    Date Medicare IM Given:    Medicare IM give by:    Date Additional Medicare IM Given:    Additional Medicare Important Message give by:     If discussed at [place] of Stay Meetings, dates discussed:    Additional Comments: This CM met with pt and caregiver at bedside. Pt confused and unable to answer questions.  Caregiver states that they have all needed equipment at home and could use HHPT/OT to help educate caregivers on assistance with ADLs. [facility] provider list left in room.  Caregiver states that they have used [facility] in the past and she will check in with pt husband this afternoon to see if he would like to use them again.  CM will continue to follow.    Addendum:  05/17/16 This CM met with pt's husband at bedside who confirms that they would like to use [facility] for HHPT/OT at discharge.  Referral sent to [facility] rep.  No other CM needs communicated. Lynnell Catalan, RN 05/16/2016, 11:40 AM  [phone]

## 2016-05-16 NOTE — Progress Notes (Signed)
PROGRESS NOTE    [name]  [id number] DOB: [birth year] DOA: 05/14/2016 PCP: Marton Redwood, MD   Brief Narrative: [name] is a 82 y.o. female with medical history significant of falls Alzheimer's disease, bladder cancer status post urostomy, and recurrent UTI.  Patient admitted with fever, lethargic, poor oral intake, sepsis. She was admitted, started on IV fluids and IV antibiotics. Awaiting cultures results.    Assessment & Plan:   Active Problems:   NEOPLASM, MALIGNANT, BLADDER, HX OF   Urinary tract infection   Hypertension   CAD (coronary artery disease)   Alzheimer's dementia   UTI (lower urinary tract infection)   Acute encephalopathy   Sepsis ([place])   Altered mental state  1-Sepsis; presents with fevers, lethargic, UA with too numerous to count WBC.  Continue with Aztreonam.  Urine culture growing multiple bacteria.  Blood culture ID p: gram negative rods. E coli, enterobacteriaceae.  Iv fluids, IV bolus this am.   2-Acute encephalopathy; lethargic on admission. Likely related to infection.  Treating for UTI/   3-Alzheimer's dementia - stable anticipate some degree of sundowning while hospitalized   4-UTI; continue with aztreonam. Follow cultures.   Hypertension and transient hypotension will hold medications   DVT prophylaxis: Lovenox./  Code Status: DNR Family Communication: care giver at bedside.  Disposition Plan: home when afebrile and cultures available.    Consultants:   none  Procedures:  none  Antimicrobials:   Aztreonam 5-23   Subjective: Sleepy wake up to voice. She says few words. denies pain. pleasantly confuse.   Objective: Filed Vitals:   05/15/16 1820 05/15/16 2115 05/16/16 0658 05/16/16 0853  BP:  113/45 90/39 109/49  Pulse:  100 86 86  Temp: 102.5 F (39.2 C) 99.8 F (37.7 C) 102.1 F (38.9 C) 97.6 F (36.4 C)  TempSrc:  Oral Axillary Oral  Resp:  20 20 16   Height:      Weight:      SpO2:  96%  95% 98%    Intake/Output Summary (Last 24 hours) at 05/16/16 0927 Last data filed at 05/16/16 0645  Gross per 24 hour  Intake      0 ml  Output    750 ml  Net   -750 ml   Filed Weights   05/15/16 0245  Weight: 58.786 kg (129 lb 9.6 oz)    Examination:  General exam: Appears calm and comfortable , sleepy wake up to voice say few words.  Respiratory system: Clear to auscultation. Respiratory effort normal. Cardiovascular system: S1 & S2 heard, RRR. No JVD, murmurs, rubs, gallops or clicks. No pedal edema. Gastrointestinal system: Abdomen is nondistended, soft and nontender. No organomegaly or masses felt. Normal bowel sounds heard. Central nervous system:  No focal neurological deficits. Extremities: Symmetric 5 x 5 power. Skin: No rashes, lesions or ulcers     Data Reviewed: I have personally reviewed following labs and imaging studies  CBC:  Recent Labs Lab 05/14/16 2109 05/15/16 0350  WBC 8.9 9.4  NEUTROABS 6.7  --   HGB 10.1* 10.8*  HCT 30.2* 32.7*  MCV 95.0 99.1  PLT 109* 99991111*   Basic Metabolic Panel:  Recent Labs Lab 05/14/16 2109 05/15/16 0350  NA 137 136  K 4.2 4.1  CL 106 107  CO2 25 24  GLUCOSE 116* 115*  BUN 26* 24*  CREATININE 0.63 0.60  CALCIUM 8.8* 8.7*  MG  --  1.8  PHOS  --  2.1*   GFR: Estimated Creatinine Clearance:  44.6 mL/min (by C-G formula based on Cr of 0.6). Liver Function Tests:  Recent Labs Lab 05/14/16 2109 05/15/16 0350  AST 17 19  ALT 15 17  ALKPHOS 53 47  BILITOT 0.6 0.5  PROT 5.8* 5.7*  ALBUMIN 3.2* 3.1*   No results for input(s): LIPASE, AMYLASE in the last 168 hours. No results for input(s): AMMONIA in the last 168 hours. Coagulation Profile: No results for input(s): INR, PROTIME in the last 168 hours. Cardiac Enzymes: No results for input(s): CKTOTAL, CKMB, CKMBINDEX, TROPONINI in the last 168 hours. BNP (last 3 results) No results for input(s): PROBNP in the last 8760 hours. HbA1C: No results for  input(s): HGBA1C in the last 72 hours. CBG: No results for input(s): GLUCAP in the last 168 hours. Lipid Profile: No results for input(s): CHOL, HDL, LDLCALC, TRIG, CHOLHDL, LDLDIRECT in the last 72 hours. Thyroid Function Tests:  Recent Labs  05/15/16 0350  TSH 1.518   Anemia Panel: No results for input(s): VITAMINB12, FOLATE, FERRITIN, TIBC, IRON, RETICCTPCT in the last 72 hours. Sepsis Labs:  Recent Labs Lab 05/14/16 2132 05/15/16 0049 05/15/16 0350  PROCALCITON  --   --  0.99  LATICACIDVEN 1.00 1.13  --     Recent Results (from the past 240 hour(s))  Blood Culture (routine x 2)     Status: Abnormal (Preliminary result)   Collection Time: 05/14/16  9:09 PM  Result Value Ref Range Status   Specimen Description BLOOD LEFT ANTECUBITAL  Final   Special Requests BOTTLES DRAWN AEROBIC AND ANAEROBIC 5ML  Final   Culture  Setup Time   Final    GRAM NEGATIVE RODS ANAEROBIC BOTTLE ONLY CRITICAL RESULT CALLED TO, READ BACK BY AND VERIFIED WITH: T GREEN 05/15/16 @ 34 M VESTAL Performed at [facility] (A)  Final   Report Status PENDING  Incomplete  Blood Culture ID Panel (Reflexed)     Status: Abnormal   Collection Time: 05/14/16  9:09 PM  Result Value Ref Range Status   Enterococcus species NOT DETECTED NOT DETECTED Final   Vancomycin resistance NOT DETECTED NOT DETECTED Final   Listeria monocytogenes NOT DETECTED NOT DETECTED Final   Staphylococcus species NOT DETECTED NOT DETECTED Final   Staphylococcus aureus NOT DETECTED NOT DETECTED Final   Methicillin resistance NOT DETECTED NOT DETECTED Final   Streptococcus species NOT DETECTED NOT DETECTED Final   Streptococcus agalactiae NOT DETECTED NOT DETECTED Final   Streptococcus pneumoniae NOT DETECTED NOT DETECTED Final   Streptococcus pyogenes NOT DETECTED NOT DETECTED Final   Acinetobacter baumannii NOT DETECTED NOT DETECTED Final   Enterobacteriaceae species DETECTED (A) NOT DETECTED  Final    Comment: CRITICAL RESULT CALLED TO, READ BACK BY AND VERIFIED WITH: T GREEN 05/15/16 @ 1247 M VESTAL    Enterobacter cloacae complex NOT DETECTED NOT DETECTED Final   Escherichia coli DETECTED (A) NOT DETECTED Final    Comment: CRITICAL RESULT CALLED TO, READ BACK BY AND VERIFIED WITH: T GREEN 05/15/16 @ 1247 M VESTAL    Klebsiella oxytoca NOT DETECTED NOT DETECTED Final   Klebsiella pneumoniae NOT DETECTED NOT DETECTED Final   Proteus species NOT DETECTED NOT DETECTED Final   Serratia marcescens NOT DETECTED NOT DETECTED Final   Carbapenem resistance NOT DETECTED NOT DETECTED Final   Haemophilus influenzae NOT DETECTED NOT DETECTED Final   Neisseria meningitidis NOT DETECTED NOT DETECTED Final   Pseudomonas aeruginosa NOT DETECTED NOT DETECTED Final   Candida albicans NOT DETECTED  NOT DETECTED Final   Candida glabrata NOT DETECTED NOT DETECTED Final   Candida krusei NOT DETECTED NOT DETECTED Final   Candida parapsilosis NOT DETECTED NOT DETECTED Final   Candida tropicalis NOT DETECTED NOT DETECTED Final    Comment: Performed at [facility]  Blood Culture (routine x 2)     Status: None (Preliminary result)   Collection Time: 05/14/16  9:18 PM  Result Value Ref Range Status   Specimen Description BLOOD RIGHT ANTECUBITAL  Final   Special Requests BOTTLES DRAWN AEROBIC AND ANAEROBIC 5ML  Final   Culture  Setup Time   Final    GRAM NEGATIVE RODS ANAEROBIC BOTTLE ONLY CRITICAL RESULT CALLED TO, READ BACK BY AND VERIFIED WITH: T GREEN 05/15/16 @ 19 M VESTAL Performed at [facility]  Final   Report Status PENDING  Incomplete  Urine culture     Status: Abnormal   Collection Time: 05/14/16 11:14 PM  Result Value Ref Range Status   Specimen Description URINE, CLEAN CATCH  Final   Special Requests NONE  Final   Culture MULTIPLE SPECIES PRESENT, SUGGEST RECOLLECTION (A)  Final   Report Status 05/16/2016 FINAL  Final          Radiology Studies: Ct Head Wo Contrast  05/15/2016  CLINICAL DATA:  Initial evaluation for acute altered mental status. EXAM: CT HEAD WITHOUT CONTRAST TECHNIQUE: Contiguous axial images were obtained from the base of the skull through the vertex without intravenous contrast. COMPARISON:  Prior study from 04/21/2015. FINDINGS: Generalized age-related cerebral atrophy with chronic small vessel ischemic disease present, similar to prior. Remote lacunar infarcts within the bilateral basal ganglia, also stable. Scattered vascular calcifications within the carotid siphons. No acute intracranial hemorrhage. No acute large vessel territory infarct. No mass lesion, midline shift or mass effect. Ventricular prominence related to global parenchymal volume loss without hydrocephalus, stable. No extra-axial fluid collection. Scalp soft tissues within normal limits. No acute abnormality about the globes and orbits. Paranasal sinuses are clear.  No mastoid effusion. Calvarium intact. IMPRESSION: 1. No acute intracranial process. 2. Stable atrophy with chronic small vessel ischemic disease. 3. Remote lacunar infarcts within the bilateral basal ganglia, stable. Electronically Signed   By: Jeannine Boga M.D.   On: 05/15/2016 01:05   Dg Chest Portable 1 View  05/14/2016  CLINICAL DATA:  Acute onset of fever. Code sepsis. Initial encounter. EXAM: PORTABLE CHEST 1 VIEW COMPARISON:  Chest radiograph performed 01/13/2015 FINDINGS: The lungs are well-aerated. Mild vascular congestion is noted, with mild bilateral atelectasis. There is no evidence of pleural effusion or pneumothorax. The cardiomediastinal silhouette is within normal limits. No acute osseous abnormalities are seen. IMPRESSION: Mild vascular congestion, with mild bilateral atelectasis. Electronically Signed   By: Garald Balding M.D.   On: 05/14/2016 22:20        Scheduled Meds: . aztreonam  1 g Intravenous Q8H  . divalproex  250 mg Oral BID   . donepezil  10 mg Oral Daily  . enoxaparin (LOVENOX) injection  40 mg Subcutaneous Q24H  . loratadine  10 mg Oral Daily  . memantine  28 mg Oral Daily  . nortriptyline  25 mg Oral QHS  . pantoprazole  20 mg Oral Daily  . temazepam  15 mg Oral QHS   Continuous Infusions: . sodium chloride 100 mL/hr at 05/16/16 0923     LOS: 1 day    Time spent: 35 minutes.     Eric Morganti, [employer]  A, MD [facility] Pager [phone]  If 7PM-7AM, please contact night-coverage [email or web address] Password [facility] 05/16/2016, 9:27 AM

## 2016-05-16 NOTE — Progress Notes (Signed)
Initial Nutrition Assessment  DOCUMENTATION CODES:   Not applicable  INTERVENTION:  Ensure Enlive BID. Each supplement provides 350 kcals and 20 grams of protein.    NUTRITION DIAGNOSIS:   Inadequate oral intake related to lethargy/confusion as evidenced by per patient/family report.  GOAL:   Patient will meet greater than or equal to 90% of their needs  MONITOR:   PO intake, Supplement acceptance, Weight trends, Labs, Skin, I & O's  REASON FOR ASSESSMENT:   Low Braden    ASSESSMENT:   Pt with medical history significant of falls Alzheimer's disease, bladder cancer status post urostomy, and recurrent UTI. Experiencing fevers.   Pt asleep at time of visit. Spoke with caregiver at bedside. Caregiver reports pt was eating well until the past few days d/t confusion from UTI. She described pt as "a good eater". She denies N/V and use of nutrition supplements. She would like to get Ensure at the hospital in case PO's are poor. She reports pt eating lunch and dinner yesterday as she normally does at home. Will order Ensure BID in case PO's decrease. She reports pt sometimes forgets to swallow water and holds it in mouth until reminded to swallow. No choking associated.   She reports pt losing weight about 1.5 years ago after hospitalization but has maintained weight since then.  Pt weighs 129 lbs and has BMI categorized as normal. No recent weight hx per chart. Last weight recorded was 1/16.  NFPE: severe depletion of muscle mass in clavicles and temples, no fat depletion, mild edema in lower extremities.    Labs reviewed; Ca 8.8, phos 1.7.  Meds reviewed.   Diet Order:  Diet Heart Room service appropriate?: Yes; Fluid consistency:: Thin  Skin:  Reviewed, no issues  Last BM:  5/23  Height:   Ht Readings from Last 1 Encounters:  05/15/16 5\' 5"  (1.651 m)    Weight:   Wt Readings from Last 1 Encounters:  05/15/16 129 lb 9.6 oz (58.786 kg)    Ideal Body Weight:  56.8  kg  BMI:  Body mass index is 21.57 kg/(m^2).  Estimated Nutritional Needs:   Kcal:  1200-1400  Protein:  65-75  Fluid:  1.2-1.4 L  EDUCATION NEEDS:   No education needs identified at this time  Geoffery Lyons, [place] Intern Pager [phone]'

## 2016-05-17 DIAGNOSIS — A4151 Sepsis due to Escherichia coli [E. coli]: Principal | ICD-10-CM

## 2016-05-17 LAB — CULTURE, BLOOD (ROUTINE X 2)

## 2016-05-17 LAB — BASIC METABOLIC PANEL
Anion gap: 5 (ref 5–15)
BUN: 17 mg/dL (ref 6–20)
CALCIUM: 8.5 mg/dL — AB (ref 8.9–10.3)
CO2: 23 mmol/L (ref 22–32)
CREATININE: 0.53 mg/dL (ref 0.44–1.00)
Chloride: 110 mmol/L (ref 101–111)
GFR calc Af Amer: >60 mL/min (ref >60)
GFR calc non Af Amer: >60 mL/min (ref >60)
Glucose, Bld: 104 mg/dL — ABNORMAL HIGH (ref 65–99)
POTASSIUM: 3.4 mmol/L — AB (ref 3.5–5.1)
SODIUM: 138 mmol/L (ref 135–145)

## 2016-05-17 LAB — URINE CULTURE

## 2016-05-17 LAB — CBC
HCT: 31.1 % — ABNORMAL LOW (ref 36.0–46.0)
Hemoglobin: 10.3 g/dL — ABNORMAL LOW (ref 12.0–15.0)
MCH: 32 pg (ref 26.0–34.0)
MCHC: 33.1 g/dL (ref 30.0–36.0)
MCV: 96.6 fL (ref 78.0–100.0)
PLATELETS: 96 10*3/uL — AB (ref 150–400)
RBC: 3.22 MIL/uL — AB (ref 3.87–5.11)
RDW: 14.6 % (ref 11.5–15.5)
WBC: 6.9 10*3/uL (ref 4.0–10.5)

## 2016-05-17 LAB — PHOSPHORUS: PHOSPHORUS: 1.7 mg/dL — AB (ref 2.5–4.6)

## 2016-05-17 MED ORDER — DEXTROSE-NACL 5-0.9 % IV SOLN
INTRAVENOUS | Status: DC
  Administered 2016-05-17: 1000 mL via INTRAVENOUS
  Administered 2016-05-17: 16:00:00 via INTRAVENOUS

## 2016-05-17 MED ORDER — CEFAZOLIN SODIUM 1-5 GM-% IV SOLN
1.0000 g | Freq: Three times a day (TID) | INTRAVENOUS | Status: DC
  Administered 2016-05-17 – 2016-05-18 (×3): 1 g via INTRAVENOUS
  Filled 2016-05-17 (×5): qty 50

## 2016-05-17 MED ORDER — K PHOS MONO-SOD PHOS DI & MONO 155-852-130 MG PO TABS
500.0000 mg | ORAL_TABLET | Freq: Two times a day (BID) | ORAL | Status: DC
  Administered 2016-05-17 – 2016-05-18 (×3): 500 mg via ORAL
  Filled 2016-05-17 (×4): qty 2

## 2016-05-17 NOTE — Progress Notes (Signed)
PROGRESS NOTE    [name]  [id number] DOB: May 03, 1926 DOA: 05/14/2016 PCP: Marton Redwood, MD   Brief Narrative: [name] is a 82 y.o. female with medical history significant of falls Alzheimer's disease, bladder cancer status post urostomy, and recurrent UTI.  Patient admitted with fever, lethargic, poor oral intake, sepsis. She was admitted, started on IV fluids and IV antibiotics. Awaiting cultures results.    Assessment & Plan:   Active Problems:   NEOPLASM, MALIGNANT, BLADDER, HX OF   Urinary tract infection   Hypertension   CAD (coronary artery disease)   Alzheimer's dementia   UTI (lower urinary tract infection)   Acute encephalopathy   Sepsis ([place])   Altered mental state  1-Sepsis; E coli Bacteremia:  presents with fevers, lethargic, UA with too numerous to count WBC.  I will change Aztreonam to ancef. E coli sensitive to ancef. Could use Ceftin at discharge. Pharmacist review prior records and patient has tolerated cephalosporin in the past.  Urine culture growing multiple bacteria.  Blood culture ID p: gram negative rods. E coli, enterobacteriaceae. E coli sensitive to ancef.  Will repeat blood culture 5-26  2-Acute encephalopathy; lethargic on admission. Likely related to infection.  Treating for UTI/  Was more alert this am, but continue to sleep during the day.   3-Alzheimer's dementia - stable anticipate some degree of sundowning while hospitalized   4-UTI; continue with ancef. Follow cultures.   Hypertension and transient hypotension will hold medications  Hypophosphatemia; change k phos to 500 mg BID.   DVT prophylaxis: Lovenox./  Code Status: DNR Family Communication: care giver at bedside.  Disposition Plan: home when afebrile and cultures available.    Consultants:   none  Procedures:  none  Antimicrobials:   Aztreonam 5-23   Subjective: Appears more alert this am . Denies pain  Objective: Filed Vitals:   05/16/16 1500 05/16/16 1747 05/16/16 2030 05/17/16 0500  BP: 104/44  135/58 132/58  Pulse: 93  99 107  Temp: 98 F (36.7 C) 101.4 F (38.6 C) 99 F (37.2 C) 98.7 F (37.1 C)  TempSrc: Oral  Oral Oral  Resp: 20  20 20   Height:      Weight:      SpO2: 98%  95% 95%    Intake/Output Summary (Last 24 hours) at 05/17/16 1414 Last data filed at 05/17/16 1055  Gross per 24 hour  Intake    240 ml  Output   1851 ml  Net  -1611 ml   Filed Weights   05/15/16 0245  Weight: 58.786 kg (129 lb 9.6 oz)    Examination:  General exam: Appears calm and comfortable  Respiratory system: Clear to auscultation. Respiratory effort normal. Cardiovascular system: S1 & S2 heard, RRR. No JVD, murmurs, rubs, gallops or clicks. No pedal edema. Gastrointestinal system: Abdomen is nondistended, soft and nontender. No organomegaly or masses felt. Normal bowel sounds heard. Central nervous system:  No focal neurological deficits. Extremities: Symmetric 5 x 5 power. Skin: No rashes, lesions or ulcers     Data Reviewed: I have personally reviewed following labs and imaging studies  CBC:  Recent Labs Lab 05/14/16 2109 05/15/16 0350 05/17/16 0331  WBC 8.9 9.4 6.9  NEUTROABS 6.7  --   --   HGB 10.1* 10.8* 10.3*  HCT 30.2* 32.7* 31.1*  MCV 95.0 99.1 96.6  PLT 109* 101* 96*   Basic Metabolic Panel:  Recent Labs Lab 05/14/16 2109 05/15/16 0350 05/16/16 0954 05/17/16 0331  NA  137 136 138 138  K 4.2 4.1 3.9 3.4*  CL 106 107 110 110  CO2 25 24 23 23   GLUCOSE 116* 115* 106* 104*  BUN 26* 24* 20 17  CREATININE 0.63 0.60 0.57 0.53  CALCIUM 8.8* 8.7* 8.8* 8.5*  MG  --  1.8  --   --   PHOS  --  2.1* 1.7* 1.7*   GFR: Estimated Creatinine Clearance: 44.6 mL/min (by C-G formula based on Cr of 0.53). Liver Function Tests:  Recent Labs Lab 05/14/16 2109 05/15/16 0350  AST 17 19  ALT 15 17  ALKPHOS 53 47  BILITOT 0.6 0.5  PROT 5.8* 5.7*  ALBUMIN 3.2* 3.1*   No results for input(s):  LIPASE, AMYLASE in the last 168 hours. No results for input(s): AMMONIA in the last 168 hours. Coagulation Profile: No results for input(s): INR, PROTIME in the last 168 hours. Cardiac Enzymes: No results for input(s): CKTOTAL, CKMB, CKMBINDEX, TROPONINI in the last 168 hours. BNP (last 3 results) No results for input(s): PROBNP in the last 8760 hours. HbA1C: No results for input(s): HGBA1C in the last 72 hours. CBG: No results for input(s): GLUCAP in the last 168 hours. Lipid Profile: No results for input(s): CHOL, HDL, LDLCALC, TRIG, CHOLHDL, LDLDIRECT in the last 72 hours. Thyroid Function Tests:  Recent Labs  05/15/16 0350  TSH 1.518   Anemia Panel: No results for input(s): VITAMINB12, FOLATE, FERRITIN, TIBC, IRON, RETICCTPCT in the last 72 hours. Sepsis Labs:  Recent Labs Lab 05/14/16 2132 05/15/16 0049 05/15/16 0350  PROCALCITON  --   --  0.99  LATICACIDVEN 1.00 1.13  --     Recent Results (from the past 240 hour(s))  Blood Culture (routine x 2)     Status: Abnormal   Collection Time: 05/14/16  9:09 PM  Result Value Ref Range Status   Specimen Description BLOOD LEFT ANTECUBITAL  Final   Special Requests BOTTLES DRAWN AEROBIC AND ANAEROBIC 5ML  Final   Culture  Setup Time   Final    GRAM NEGATIVE RODS ANAEROBIC BOTTLE ONLY CRITICAL RESULT CALLED TO, READ BACK BY AND VERIFIED WITH: T GREEN 05/15/16 @ 54 M VESTAL Performed at [place] (A)  Final   Report Status 05/17/2016 FINAL  Final   Organism ID, Bacteria ESCHERICHIA COLI  Final      Susceptibility   Escherichia coli - MIC*    AMPICILLIN >=32 RESISTANT Resistant     CEFAZOLIN <=4 SENSITIVE Sensitive     CEFEPIME <=1 SENSITIVE Sensitive     CEFTAZIDIME <=1 SENSITIVE Sensitive     CEFTRIAXONE <=1 SENSITIVE Sensitive     CIPROFLOXACIN >=4 RESISTANT Resistant     GENTAMICIN <=1 SENSITIVE Sensitive     IMIPENEM <=0.25 SENSITIVE Sensitive     TRIMETH/SULFA <=20  SENSITIVE Sensitive     AMPICILLIN/SULBACTAM 8 SENSITIVE Sensitive     PIP/TAZO <=4 SENSITIVE Sensitive     * ESCHERICHIA COLI  Blood Culture ID Panel (Reflexed)     Status: Abnormal   Collection Time: 05/14/16  9:09 PM  Result Value Ref Range Status   Enterococcus species NOT DETECTED NOT DETECTED Final   Vancomycin resistance NOT DETECTED NOT DETECTED Final   Listeria monocytogenes NOT DETECTED NOT DETECTED Final   Staphylococcus species NOT DETECTED NOT DETECTED Final   Staphylococcus aureus NOT DETECTED NOT DETECTED Final   Methicillin resistance NOT DETECTED NOT DETECTED Final   Streptococcus species NOT DETECTED NOT DETECTED Final  Streptococcus agalactiae NOT DETECTED NOT DETECTED Final   Streptococcus pneumoniae NOT DETECTED NOT DETECTED Final   Streptococcus pyogenes NOT DETECTED NOT DETECTED Final   Acinetobacter baumannii NOT DETECTED NOT DETECTED Final   Enterobacteriaceae species DETECTED (A) NOT DETECTED Final    Comment: CRITICAL RESULT CALLED TO, READ BACK BY AND VERIFIED WITH: T GREEN 05/15/16 @ 32 M VESTAL    Enterobacter cloacae complex NOT DETECTED NOT DETECTED Final   Escherichia coli DETECTED (A) NOT DETECTED Final    Comment: CRITICAL RESULT CALLED TO, READ BACK BY AND VERIFIED WITH: T GREEN 05/15/16 @ 61 M VESTAL    Klebsiella oxytoca NOT DETECTED NOT DETECTED Final   Klebsiella pneumoniae NOT DETECTED NOT DETECTED Final   Proteus species NOT DETECTED NOT DETECTED Final   Serratia marcescens NOT DETECTED NOT DETECTED Final   Carbapenem resistance NOT DETECTED NOT DETECTED Final   Haemophilus influenzae NOT DETECTED NOT DETECTED Final   Neisseria meningitidis NOT DETECTED NOT DETECTED Final   Pseudomonas aeruginosa NOT DETECTED NOT DETECTED Final   Candida albicans NOT DETECTED NOT DETECTED Final   Candida glabrata NOT DETECTED NOT DETECTED Final   Candida krusei NOT DETECTED NOT DETECTED Final   Candida parapsilosis NOT DETECTED NOT DETECTED Final    Candida tropicalis NOT DETECTED NOT DETECTED Final    Comment: Performed at [facility]  Blood Culture (routine x 2)     Status: Abnormal   Collection Time: 05/14/16  9:18 PM  Result Value Ref Range Status   Specimen Description BLOOD RIGHT ANTECUBITAL  Final   Special Requests BOTTLES DRAWN AEROBIC AND ANAEROBIC 5ML  Final   Culture  Setup Time   Final    GRAM NEGATIVE RODS ANAEROBIC BOTTLE ONLY CRITICAL RESULT CALLED TO, READ BACK BY AND VERIFIED WITH: T GREEN 05/15/16 @ 53 M VESTAL    Culture (A)  Final    ESCHERICHIA COLI SUSCEPTIBILITIES PERFORMED ON PREVIOUS CULTURE WITHIN THE LAST 5 DAYS. Performed at [facility]    Report Status 05/17/2016 FINAL  Final  Urine culture     Status: Abnormal   Collection Time: 05/14/16 11:14 PM  Result Value Ref Range Status   Specimen Description URINE, CLEAN CATCH  Final   Special Requests NONE  Final   Culture MULTIPLE SPECIES PRESENT, SUGGEST RECOLLECTION (A)  Final   Report Status 05/16/2016 FINAL  Final  Urine culture     Status: Abnormal   Collection Time: 05/16/16 10:52 AM  Result Value Ref Range Status   Specimen Description URINE, CLEAN CATCH  Final   Special Requests NONE  Final   Culture MULTIPLE SPECIES PRESENT, SUGGEST RECOLLECTION (A)  Final   Report Status 05/17/2016 FINAL  Final         Radiology Studies: No results found.      Scheduled Meds: .  ceFAZolin (ANCEF) IV  1 g Intravenous Q8H  . divalproex  250 mg Oral BID  . donepezil  10 mg Oral Daily  . enoxaparin (LOVENOX) injection  40 mg Subcutaneous Q24H  . feeding supplement (ENSURE ENLIVE)  237 mL Oral BID BM  . loratadine  10 mg Oral Daily  . memantine  28 mg Oral Daily  . nortriptyline  25 mg Oral QHS  . pantoprazole  20 mg Oral Daily  . phosphorus  500 mg Oral BID  . polyethylene glycol  17 g Oral Daily  . temazepam  15 mg Oral QHS   Continuous Infusions: . sodium chloride 100 mL/hr at  05/17/16 0230     LOS: 2 days    Time  spent: 35 minutes.     Elmarie Shiley, MD [facility] Pager [phone]  If 7PM-7AM, please contact night-coverage [email or web address] Password [email or web address] 05/17/2016, 2:14 PM

## 2016-05-17 NOTE — Evaluation (Signed)
Physical Therapy Evaluation Patient Details Name: [name] MRN: [id number] DOB: 13-Dec-1926 Today's Date: 05/17/2016   History of Present Illness  82 y.o. female with medical history significant of falls Alzheimer's disease, bladder cancer status post urostomy, and recurrent UTI and admitted for sepsis  Clinical Impression  Pt admitted with above diagnosis. Pt currently with functional limitations due to the deficits listed below (see PT Problem List).  Pt will benefit from skilled PT to increase their independence and safety with mobility to allow discharge to the venue listed below.  Pt from home with 24/7 caregivers and typically transfers to/from w/c.  Pt very lethargic today and requiring increased assist however anticipate pt will perform better once more awake/alert as her caregiver reports she sleeps a lot during the day, and she is usually able to transfer without physical assist.     Follow Up Recommendations Home health PT;Supervision/Assistance - 24 hour    Equipment Recommendations  None recommended by PT    Recommendations for Other Services       Precautions / Restrictions Precautions Precautions: Fall      Mobility  Bed Mobility Overal bed mobility: Needs Assistance;+2 for physical assistance Bed Mobility: Supine to Sit;Sit to Supine     Supine to sit: +2 for physical assistance;Total assist Sit to supine: +2 for physical assistance;Total assist   General bed mobility comments: pt very lethargic however caregiver states pt sleeping a lot during the day, assisted to EOB and pt awakened however would not open eyes, pt not assisting with maintaining upright posture and assisted back to supine  Transfers Overall transfer level:  (NT due to decreased awake/alert)                  Ambulation/Gait                Stairs            Wheelchair Mobility    Modified Rankin (Stroke Patients Only)       Balance Overall balance  assessment: Needs assistance Sitting-balance support: Feet supported;No upper extremity supported Sitting balance-Leahy Scale: Zero Sitting balance - Comments: pt not assisting with upright posture, may have been from decreased cognition/alertness                                     Pertinent Vitals/Pain Pain Assessment: Faces Faces Pain Scale: Hurts even more Pain Location: grimacing upon return to supine Pain Descriptors / Indicators: Grimacing Pain Intervention(s): Monitored during session    Home Living Family/patient expects to be discharged to:: Private residence Living Arrangements: Spouse/significant other Available Help at Discharge: Personal care attendant;Available 24 hours/day;Family Type of Home: House         Home Equipment: Bedside commode;Walker - 2 wheels;Hospital bed      Prior Function Level of Independence: Needs assistance   Gait / Transfers Assistance Needed: caregiver reports pt typically stand pivots with stand by assist and gait belt           Hand Dominance        Extremity/Trunk Assessment   Upper Extremity Assessment: Generalized weakness           Lower Extremity Assessment: Generalized weakness         Communication   Communication: Other (comment) (difficult to assess due to lethargy)  Cognition Arousal/Alertness: Lethargic   Overall Cognitive Status: Difficult to assess  General Comments      Exercises        Assessment/Plan    PT Assessment Patient needs continued PT services  PT Diagnosis Generalized weakness   PT Problem List Decreased strength;Decreased activity tolerance;Decreased mobility;Decreased balance  PT Treatment Interventions DME instruction;Functional mobility training;Patient/family education;Therapeutic activities;Therapeutic exercise;Wheelchair mobility training;Balance training   PT Goals (Current goals can be found in the Care Plan section) Acute  Rehab PT Goals PT Goal Formulation: With patient/family Time For Goal Achievement: 05/24/16 Potential to Achieve Goals: Good    Frequency Min 3X/week   Barriers to discharge        Co-evaluation               End of Session   Activity Tolerance: Patient limited by lethargy Patient left: in bed;with call bell/phone within reach;with bed alarm set;with family/visitor present           Time: [id number] PT Time Calculation (min) (ACUTE ONLY): 12 min   Charges:   PT Evaluation $PT Eval Low Complexity: 1 Procedure     PT G Codes:        Tyanne Derocher,KATHrine E 05/17/2016, 3:38 PM Carmelia Bake, PT, DPT 05/17/2016 Pager: [phone]

## 2016-05-18 ENCOUNTER — Inpatient Hospital Stay (HOSPITAL_COMMUNITY): Payer: Medicare Other

## 2016-05-18 DIAGNOSIS — I10 Essential (primary) hypertension: Secondary | ICD-10-CM

## 2016-05-18 MED ORDER — DILTIAZEM HCL 30 MG PO TABS
30.0000 mg | ORAL_TABLET | Freq: Two times a day (BID) | ORAL | Status: DC
  Administered 2016-05-18: 30 mg via ORAL
  Filled 2016-05-18: qty 1

## 2016-05-18 MED ORDER — SODIUM CHLORIDE 0.9 % IV SOLN
500.0000 mg | Freq: Two times a day (BID) | INTRAVENOUS | Status: DC
  Administered 2016-05-18 – 2016-05-21 (×7): 500 mg via INTRAVENOUS
  Filled 2016-05-18 (×7): qty 500

## 2016-05-18 MED ORDER — K PHOS MONO-SOD PHOS DI & MONO 155-852-130 MG PO TABS
500.0000 mg | ORAL_TABLET | Freq: Three times a day (TID) | ORAL | Status: DC
  Administered 2016-05-18 (×2): 500 mg via ORAL
  Filled 2016-05-18 (×5): qty 2

## 2016-05-18 MED ORDER — DEXTROSE 5 % IV SOLN
1.0000 g | Freq: Two times a day (BID) | INTRAVENOUS | Status: DC
  Administered 2016-05-18 – 2016-05-22 (×8): 1 g via INTRAVENOUS
  Filled 2016-05-18 (×9): qty 1

## 2016-05-18 MED ORDER — FUROSEMIDE 20 MG PO TABS
20.0000 mg | ORAL_TABLET | Freq: Two times a day (BID) | ORAL | Status: DC
  Administered 2016-05-18 – 2016-05-19 (×2): 20 mg via ORAL
  Filled 2016-05-18 (×2): qty 1

## 2016-05-18 MED ORDER — TEMAZEPAM 7.5 MG PO CAPS
7.5000 mg | ORAL_CAPSULE | Freq: Every day | ORAL | Status: DC
Start: 2016-05-18 — End: 2016-05-23
  Administered 2016-05-18 – 2016-05-22 (×5): 7.5 mg via ORAL
  Filled 2016-05-18 (×5): qty 1

## 2016-05-18 MED ORDER — ATENOLOL 25 MG PO TABS
25.0000 mg | ORAL_TABLET | Freq: Two times a day (BID) | ORAL | Status: DC
  Administered 2016-05-18 – 2016-05-23 (×10): 25 mg via ORAL
  Filled 2016-05-18 (×10): qty 1

## 2016-05-18 MED ORDER — POTASSIUM CHLORIDE CRYS ER 20 MEQ PO TBCR
40.0000 meq | EXTENDED_RELEASE_TABLET | Freq: Once | ORAL | Status: AC
  Administered 2016-05-18: 40 meq via ORAL
  Filled 2016-05-18: qty 2

## 2016-05-18 NOTE — Progress Notes (Signed)
Attempted to call report nurse currently assisting MD. Will return the call.

## 2016-05-18 NOTE — Progress Notes (Signed)
CCMD called to report pt. Had an 8 beat run of VTach. Pt is asleep and stable. MD made aware, will continue to monitor

## 2016-05-18 NOTE — Care Management Important Message (Signed)
Important Message  Patient Details  Name: LILLIAN SAURO MRN: [id number] Date of Birth: 10-Sep-1926   Medicare Important Message Given:  Yes    Camillo Flaming 05/18/2016, 9:07 AMImportant Message  Patient Details  Name: EVERGREEN VARUGHESE MRN: [id number] Date of Birth: 07-08-1926   Medicare Important Message Given:  Yes    Camillo Flaming 05/18/2016, 9:07 AM

## 2016-05-18 NOTE — Progress Notes (Signed)
Pharmacy Antibiotic Note  [name] is a 82 y.o. female admitted on 05/14/2016 with sepsis/ E coli bacteremia.  Today a chest x-ray showed possible pneumonia .  Pharmacy has been consulted for Vancomycin and cefepime dosing.  Plan: -Vancomycin 500mg  IV q12h -cefepime 1gm IV q12h (this will cover the ecoli from previous blood cultures)  -follow cultures, renal function, length of therapy -vanc trough at steady state as indicated  Height: 5\' 5"  (165.1 cm) Weight: 129 lb 9.6 oz (58.786 kg) IBW/kg (Calculated) : 57  Temp (24hrs), Avg:98.4 F (36.9 C), Min:98.3 F (36.8 C), Max:98.5 F (36.9 C)   Recent Labs Lab 05/14/16 2109 05/14/16 2132 05/15/16 0049 05/15/16 0350 05/16/16 0954 05/17/16 0331  WBC 8.9  --   --  9.4  --  6.9  CREATININE 0.63  --   --  0.60 0.57 0.53  LATICACIDVEN  --  1.00 1.13  --   --   --     Estimated Creatinine Clearance: 44.6 mL/min (by C-G formula based on Cr of 0.53).    Allergies  Allergen Reactions  . Penicillins Anaphylaxis    Severe edema, severe difficulty breathing, anaphylactic reaction. Per older [employer]film[employer]video) records - patient has tolerated multiple cephalosporins (ceftriaxone, cefepime, cefuroxime)  . Bethanechol Chloride     Chest pains. Coronary spasms  . Celecoxib     Celebrex.  Severe itching.   . Citalopram Hydrobromide     Hyper, nervousness  . Clindamycin     Itching, coughing  . Codeine     tachycardia  . Desipramine     Severe tachycardia  . Epinephrine     Severe shortness of breath.   . Erythromycin     Burning, itching  . Floxin [Ocuflox]     itching  . Glucose     Sugar.  Weakness,faintness  . Hydromorphone Hcl     Dilaudid.  Comatose-Anaphylactic reaction.   . Ibuprofen     Tachycardia, weakness  . Iodine     Hives, edema, tachycardia  . Meperidine Hcl     Demerol.  Tachycardia, extreme nervousness.   . Morphine     Tachycardia, hallucinations, extreme nervousness.  . Paroxetine Hcl     Extreme  nervousness  . Procaine Hcl     Novocain. Severe shortness of breath.  . Propranolol Hcl     Inderal.  Extreme low blood pressure.   . Red Dye     Severe itching  . Restoril     Temazepam.  Extreme nervousness, sleeplessness  . Scopolamine     Irrational, loss of memory  . Sulfonamide Derivatives     Hives, edema  . Tetracycline     Severe hives, edema  . Trazodone And Nefazodone     Very hyper  . Valium     itching    Antimicrobials this admission: 5/22 levaquin >> 5/23  5/23 azactam >> 5/25  5/25 cefazolin >> 5/26 5/26 vancomycin >> 5/26 cefepime >>  Dose adjustments this admission:   Microbiology results: 5/22 BCx: e. Coli (R to amp, cipro)  5/22 UCx: multiple species present suggest recollection 5/26 BCx : sent   Thank you for allowing pharmacy to be a part of this patient's care.  Dolly Rias RPh 05/18/2016, 1:34 PM Pager [phone]

## 2016-05-18 NOTE — Progress Notes (Signed)
PROGRESS NOTE    [name]  [id number] DOB: 04/19/1926 DOA: 05/14/2016 PCP: Marton Redwood, MD   Brief Narrative: [name] is a 82 y.o. female with medical history significant of falls Alzheimer's disease, bladder cancer status post urostomy, and recurrent UTI.  Patient admitted with fever, lethargic, poor oral intake, sepsis. She was admitted, started on IV fluids and IV antibiotics. Awaiting cultures results.    Assessment & Plan:   Active Problems:   NEOPLASM, MALIGNANT, BLADDER, HX OF   Urinary tract infection   Hypertension   CAD (coronary artery disease)   Alzheimer's dementia   UTI (lower urinary tract infection)   Acute encephalopathy   Sepsis ([place])   Altered mental state  1-Sepsis; E coli Bacteremia:  presents with fevers, lethargic, UA with too numerous to count WBC.  I will change Aztreonam to ancef. E coli sensitive to ancef. Could use Ceftin at discharge. Pharmacist review prior records and patient has tolerated cephalosporin in the past.  Urine culture growing multiple bacteria.  Blood culture ID p: gram negative rods. E coli, enterobacteriaceae. E coli sensitive to ancef.  Repeat blood culture 5-26  2-Acute encephalopathy; lethargic on admission. Likely related to infection.  Treating for UTI/  Was more alert this am, but continue to sleep during the day.  Will decrease restoril dose.   3-Alzheimer's dementia - stable anticipate some degree of sundowning while hospitalized   4-UTI; ancef change to cefepime to cover for PNA>   5-Tachycardia;  Sinus with PVC.  Received dose of cardizem. Will resume atenolol.  Transfer to telemetry.  6-cough; chest x ray consistent with PNA/ speech swallow evaluation. Change ancef to cefepime and add vancomycin.   7-Hypophosphatemia; change k phos to 500 mg TID.    DVT prophylaxis: Lovenox./  Code Status: DNR Family Communication: care giver at bedside. Husband at bedside.  Disposition Plan: home  when afebrile and cultures available.    Consultants:   none  Procedures:  none  Antimicrobials:   Vancomycin 5-26  Cefepime 5-26   Subjective: Alert this am. HR increase to 145 while transition from bed to chair with PT.  Patient denies chest pain or dyspnea. Caregiver notice patient has been coughing and breathing different.     Objective: Filed Vitals:   05/17/16 1510 05/17/16 2232 05/18/16 0656 05/18/16 1045  BP: 117/45 136/59 151/56 153/71  Pulse: 103 113 119 123  Temp: 98.4 F (36.9 C) 98.5 F (36.9 C) 98.5 F (36.9 C) 98.3 F (36.8 C)  TempSrc: Oral Oral Oral Axillary  Resp: 14 16 18 20   Height:      Weight:      SpO2: 98% 97% 94% 97%    Intake/Output Summary (Last 24 hours) at 05/18/16 1429 Last data filed at 05/18/16 1040  Gross per 24 hour  Intake    225 ml  Output   1550 ml  Net  -1325 ml   Filed Weights   05/15/16 0245  Weight: 58.786 kg (129 lb 9.6 oz)    Examination:  General exam: Appears calm and comfortable  Respiratory system: Clear to auscultation. Respiratory effort normal. Cardiovascular system: S1 & S2 heard, RRR. No JVD, murmurs, rubs, gallops or clicks. No pedal edema. Gastrointestinal system: Abdomen is nondistended, soft and nontender. No organomegaly or masses felt. Normal bowel sounds heard. Central nervous system:  No focal neurological deficits. Extremities: Symmetric 5 x 5 power. Skin: No rashes, lesions or ulcers     Data Reviewed: I have personally reviewed  following labs and imaging studies  CBC:  Recent Labs Lab 05/14/16 2109 05/15/16 0350 05/17/16 0331  WBC 8.9 9.4 6.9  NEUTROABS 6.7  --   --   HGB 10.1* 10.8* 10.3*  HCT 30.2* 32.7* 31.1*  MCV 95.0 99.1 96.6  PLT 109* 101* 96*   Basic Metabolic Panel:  Recent Labs Lab 05/14/16 2109 05/15/16 0350 05/16/16 0954 05/17/16 0331  NA 137 136 138 138  K 4.2 4.1 3.9 3.4*  CL 106 107 110 110  CO2 25 24 23 23   GLUCOSE 116* 115* 106* 104*  BUN 26*  24* 20 17  CREATININE 0.63 0.60 0.57 0.53  CALCIUM 8.8* 8.7* 8.8* 8.5*  MG  --  1.8  --   --   PHOS  --  2.1* 1.7* 1.7*   GFR: Estimated Creatinine Clearance: 44.6 mL/min (by C-G formula based on Cr of 0.53). Liver Function Tests:  Recent Labs Lab 05/14/16 2109 05/15/16 0350  AST 17 19  ALT 15 17  ALKPHOS 53 47  BILITOT 0.6 0.5  PROT 5.8* 5.7*  ALBUMIN 3.2* 3.1*   No results for input(s): LIPASE, AMYLASE in the last 168 hours. No results for input(s): AMMONIA in the last 168 hours. Coagulation Profile: No results for input(s): INR, PROTIME in the last 168 hours. Cardiac Enzymes: No results for input(s): CKTOTAL, CKMB, CKMBINDEX, TROPONINI in the last 168 hours. BNP (last 3 results) No results for input(s): PROBNP in the last 8760 hours. HbA1C: No results for input(s): HGBA1C in the last 72 hours. CBG: No results for input(s): GLUCAP in the last 168 hours. Lipid Profile: No results for input(s): CHOL, HDL, LDLCALC, TRIG, CHOLHDL, LDLDIRECT in the last 72 hours. Thyroid Function Tests: No results for input(s): TSH, T4TOTAL, FREET4, T3FREE, THYROIDAB in the last 72 hours. Anemia Panel: No results for input(s): VITAMINB12, FOLATE, FERRITIN, TIBC, IRON, RETICCTPCT in the last 72 hours. Sepsis Labs:  Recent Labs Lab 05/14/16 2132 05/15/16 0049 05/15/16 0350  PROCALCITON  --   --  0.99  LATICACIDVEN 1.00 1.13  --     Recent Results (from the past 240 hour(s))  Blood Culture (routine x 2)     Status: Abnormal   Collection Time: 05/14/16  9:09 PM  Result Value Ref Range Status   Specimen Description BLOOD LEFT ANTECUBITAL  Final   Special Requests BOTTLES DRAWN AEROBIC AND ANAEROBIC 5ML  Final   Culture  Setup Time   Final    GRAM NEGATIVE RODS ANAEROBIC BOTTLE ONLY CRITICAL RESULT CALLED TO, READ BACK BY AND VERIFIED WITH: T GREEN 05/15/16 @ 35 M VESTAL Performed at [facility] (A)  Final   Report Status 05/17/2016 FINAL   Final   Organism ID, Bacteria ESCHERICHIA COLI  Final      Susceptibility   Escherichia coli - MIC*    AMPICILLIN >=32 RESISTANT Resistant     CEFAZOLIN <=4 SENSITIVE Sensitive     CEFEPIME <=1 SENSITIVE Sensitive     CEFTAZIDIME <=1 SENSITIVE Sensitive     CEFTRIAXONE <=1 SENSITIVE Sensitive     CIPROFLOXACIN >=4 RESISTANT Resistant     GENTAMICIN <=1 SENSITIVE Sensitive     IMIPENEM <=0.25 SENSITIVE Sensitive     TRIMETH/SULFA <=20 SENSITIVE Sensitive     AMPICILLIN/SULBACTAM 8 SENSITIVE Sensitive     PIP/TAZO <=4 SENSITIVE Sensitive     * ESCHERICHIA COLI  Blood Culture ID Panel (Reflexed)     Status: Abnormal   Collection Time:  05/14/16  9:09 PM  Result Value Ref Range Status   Enterococcus species NOT DETECTED NOT DETECTED Final   Vancomycin resistance NOT DETECTED NOT DETECTED Final   Listeria monocytogenes NOT DETECTED NOT DETECTED Final   Staphylococcus species NOT DETECTED NOT DETECTED Final   Staphylococcus aureus NOT DETECTED NOT DETECTED Final   Methicillin resistance NOT DETECTED NOT DETECTED Final   Streptococcus species NOT DETECTED NOT DETECTED Final   Streptococcus agalactiae NOT DETECTED NOT DETECTED Final   Streptococcus pneumoniae NOT DETECTED NOT DETECTED Final   Streptococcus pyogenes NOT DETECTED NOT DETECTED Final   Acinetobacter baumannii NOT DETECTED NOT DETECTED Final   Enterobacteriaceae species DETECTED (A) NOT DETECTED Final    Comment: CRITICAL RESULT CALLED TO, READ BACK BY AND VERIFIED WITH: T GREEN 05/15/16 @ 1247 M VESTAL    Enterobacter cloacae complex NOT DETECTED NOT DETECTED Final   Escherichia coli DETECTED (A) NOT DETECTED Final    Comment: CRITICAL RESULT CALLED TO, READ BACK BY AND VERIFIED WITH: T GREEN 05/15/16 @ 1247 M VESTAL    Klebsiella oxytoca NOT DETECTED NOT DETECTED Final   Klebsiella pneumoniae NOT DETECTED NOT DETECTED Final   Proteus species NOT DETECTED NOT DETECTED Final   Serratia marcescens NOT DETECTED NOT DETECTED  Final   Carbapenem resistance NOT DETECTED NOT DETECTED Final   Haemophilus influenzae NOT DETECTED NOT DETECTED Final   Neisseria meningitidis NOT DETECTED NOT DETECTED Final   Pseudomonas aeruginosa NOT DETECTED NOT DETECTED Final   Candida albicans NOT DETECTED NOT DETECTED Final   Candida glabrata NOT DETECTED NOT DETECTED Final   Candida krusei NOT DETECTED NOT DETECTED Final   Candida parapsilosis NOT DETECTED NOT DETECTED Final   Candida tropicalis NOT DETECTED NOT DETECTED Final    Comment: Performed at [facility]  Blood Culture (routine x 2)     Status: Abnormal   Collection Time: 05/14/16  9:18 PM  Result Value Ref Range Status   Specimen Description BLOOD RIGHT ANTECUBITAL  Final   Special Requests BOTTLES DRAWN AEROBIC AND ANAEROBIC  Final   Culture  Setup Time   Final    GRAM NEGATIVE RODS ANAEROBIC BOTTLE ONLY CRITICAL RESULT CALLED TO, READ BACK BY AND VERIFIED WITH: T GREEN 05/15/16 @ 1247 M VESTAL    Culture (A)  Final    ESCHERICHIA COLI SUSCEPTIBILITIES PERFORMED ON PREVIOUS CULTURE WITHIN THE LAST 5 DAYS. Performed at [facility]    Report Status 05/17/2016 FINAL  Final  Urine culture     Status: Abnormal   Collection Time: 05/14/16 11:14 PM  Result Value Ref Range Status   Specimen Description URINE, CLEAN CATCH  Final   Special Requests NONE  Final   Culture MULTIPLE SPECIES PRESENT, SUGGEST RECOLLECTION (A)  Final   Report Status 05/16/2016 FINAL  Final  Urine culture     Status: Abnormal   Collection Time: 05/16/16 10:52 AM  Result Value Ref Range Status   Specimen Description URINE, CLEAN CATCH  Final   Special Requests NONE  Final   Culture MULTIPLE SPECIES PRESENT, SUGGEST RECOLLECTION (A)  Final   Report Status 05/17/2016 FINAL  Final         Radiology Studies: Dg Chest 2 View  05/18/2016  CLINICAL DATA:  Cough, lethargy, altered mental status EXAM: CHEST  2 VIEW COMPARISON:  05/14/2016 FINDINGS: Cardiomediastinal  silhouette is stable. There is small bilateral pleural effusion with bilateral basilar atelectasis or infiltrate. Additional patchy airspace disease is noted in right upper  lobe. Findings suspicious for pneumonia. Less likely asymmetric pulmonary edema. Clinical correlation is necessary IMPRESSION: There is small bilateral pleural effusion with bilateral basilar atelectasis or infiltrate. Additional patchy airspace disease is noted in right upper lobe. Findings suspicious for pneumonia. Less likely asymmetric pulmonary edema. Clinical correlation is necessary Electronically Signed   By: Lahoma Crocker M.D.   On: 05/18/2016 11:43        Scheduled Meds: . ceFEPime (MAXIPIME) IV  1 g Intravenous Q12H  . diltiazem  30 mg Oral Q12H  . divalproex  250 mg Oral BID  . donepezil  10 mg Oral Daily  . enoxaparin (LOVENOX) injection  40 mg Subcutaneous Q24H  . feeding supplement (ENSURE ENLIVE)  237 mL Oral BID BM  . loratadine  10 mg Oral Daily  . memantine  28 mg Oral Daily  . nortriptyline  25 mg Oral QHS  . pantoprazole  20 mg Oral Daily  . phosphorus  500 mg Oral TID  . polyethylene glycol  17 g Oral Daily  . temazepam  7.5 mg Oral QHS  . vancomycin  500 mg Intravenous Q12H   Continuous Infusions:     LOS: 3 days    Time spent: 35 minutes.     Elmarie Shiley, MD [facility] Pager [phone]  If 7PM-7AM, please contact night-coverage [email or web address] Password [facility] 05/18/2016, 2:29 PM

## 2016-05-18 NOTE — Progress Notes (Signed)
Physical Therapy Treatment Patient Details Name: [name] MRN: [id number] DOB: 08/01/1926 Today's Date: 05/18/2016    History of Present Illness 82 y.o. female with medical history significant of falls Alzheimer's disease, bladder cancer status post urostomy, and recurrent UTI and admitted for sepsis    PT Comments    Family present during session.  Prior pt was able to perform self transfer, stated family.  She did not walk. Pt progressing poorly.  Still requires + 2 total assist for all mobility.  Pt remains Lethargic and exhibits poor eye contact.  Did speak a few words but random.  Assisted OOB to [facility] die to incont BM.  Pt was unable to support self and required + 2 total assist.  Noted in RR and WOB.  HR increased to 145 and RA 99%.  BP was 137/104.  Pt was unable to support self sitting on [facility] requiring Max Assist to prevent forward LOB.  Assisted back to bed.  Follow Up Recommendations  Home health PT;Supervision/Assistance - 24 hour;SNF  Will consult LPT Pt may need ST Rehab pending family decision   Equipment Recommendations  None recommended by PT    Recommendations for Other Services       Precautions / Restrictions Precautions Precautions: Fall Restrictions Weight Bearing Restrictions: No    Mobility  Bed Mobility Overal bed mobility: Needs Assistance;+2 for physical assistance Bed Mobility: Supine to Sit;Sit to Supine     Supine to sit: +2 for physical assistance;Total assist (pt 5%) Sit to supine: +2 for physical assistance;Total assist (5%)   General bed mobility comments: difficult to maintain arousal.  Groggy/sleepy.  Poor eye contact.  Speaking few words but not in correct content.    Transfers Overall transfer level: Needs assistance Equipment used: None Transfers: Stand Pivot Transfers   Stand pivot transfers: Total assist;+2 physical assistance;+2 safety/equipment (pt 0%)       General transfer comment: assisted from elevated bed to [facility]  due to incont bowel + 2 total assist "Bear Hug" stand pivot 1/4 turn.  Pt required max assist to prevent forward LOB while sitting on BSC.  Very weak with noted increased HR and WOB.  HR increased to 145 and RA was 99%.    Ambulation/Gait                 Stairs            Wheelchair Mobility    Modified Rankin (Stroke Patients Only)       Balance                                    Cognition Arousal/Alertness: Lethargic   Overall Cognitive Status: Difficult to assess                      Exercises      General Comments        Pertinent Vitals/Pain Pain Assessment: No/denies pain    Home Living                      Prior Function            PT Goals (current goals can now be found in the care plan section) Progress towards PT goals: Progressing toward goals    Frequency  Min 3X/week    PT Plan Current plan remains appropriate    Co-evaluation  End of Session Equipment Utilized During Treatment: Gait belt Activity Tolerance: Treatment limited secondary to medical complications (Comment) Patient left: in bed;with call bell/phone within reach;with bed alarm set;with family/visitor present     Time: [id number] PT Time Calculation (min) (ACUTE ONLY): 27 min  Charges:  $Therapeutic Activity: 8-22 mins                    G Codes:      Rica Koyanagi  PTA [facility]  Acute  Rehab Pager      [phone]

## 2016-05-18 NOTE — Progress Notes (Signed)
Katie RN called for report. Report given with no further questions. Will transport to 1410 via bed

## 2016-05-19 LAB — BASIC METABOLIC PANEL
ANION GAP: 5 (ref 5–15)
BUN: 17 mg/dL (ref 6–20)
CALCIUM: 8.7 mg/dL — AB (ref 8.9–10.3)
CHLORIDE: 112 mmol/L — AB (ref 101–111)
CO2: 26 mmol/L (ref 22–32)
CREATININE: 0.53 mg/dL (ref 0.44–1.00)
GFR calc non Af Amer: >60 mL/min (ref >60)
Glucose, Bld: 119 mg/dL — ABNORMAL HIGH (ref 65–99)
Potassium: 3.6 mmol/L (ref 3.5–5.1)
SODIUM: 143 mmol/L (ref 135–145)

## 2016-05-19 LAB — MAGNESIUM: MAGNESIUM: 1.7 mg/dL (ref 1.7–2.4)

## 2016-05-19 LAB — CBC
HCT: 26 % — ABNORMAL LOW (ref 36.0–46.0)
Hemoglobin: 8.8 g/dL — ABNORMAL LOW (ref 12.0–15.0)
MCH: 32.2 pg (ref 26.0–34.0)
MCHC: 33.8 g/dL (ref 30.0–36.0)
MCV: 95.2 fL (ref 78.0–100.0)
PLATELETS: 118 10*3/uL — AB (ref 150–400)
RBC: 2.73 MIL/uL — AB (ref 3.87–5.11)
RDW: 14.4 % (ref 11.5–15.5)
WBC: 7 10*3/uL (ref 4.0–10.5)

## 2016-05-19 LAB — PHOSPHORUS: Phosphorus: 3.4 mg/dL (ref 2.5–4.6)

## 2016-05-19 MED ORDER — FUROSEMIDE 20 MG PO TABS: 20.0000 mg | ORAL_TABLET | Freq: Every day | ORAL | Status: DC

## 2016-05-19 MED ORDER — MAGNESIUM SULFATE 2 GM/50ML IV SOLN
2.0000 g | Freq: Once | INTRAVENOUS | Status: AC
  Administered 2016-05-19: 2 g via INTRAVENOUS
  Filled 2016-05-19: qty 50

## 2016-05-19 MED ORDER — HYDRALAZINE HCL 20 MG/ML IJ SOLN: 10.0000 mg | Freq: Four times a day (QID) | INTRAMUSCULAR | Status: DC | PRN

## 2016-05-19 MED ORDER — LORATADINE 10 MG PO TABS
10.0000 mg | ORAL_TABLET | Freq: Every day | ORAL | Status: DC
  Administered 2016-05-20 – 2016-05-23 (×4): 10 mg via ORAL
  Filled 2016-05-19 (×4): qty 1

## 2016-05-19 MED ORDER — ACETAMINOPHEN 650 MG RE SUPP
650.0000 mg | Freq: Once | RECTAL | Status: AC
  Administered 2016-05-19: 650 mg via RECTAL

## 2016-05-19 NOTE — Progress Notes (Signed)
PROGRESS NOTE    LEAHA RODELL  [id number] DOB: August 14, 1926 DOA: 05/14/2016 PCP: Marton Redwood, MD   Brief Narrative: [name] is a 82 y.o. female with medical history significant of falls Alzheimer's disease, bladder cancer status post urostomy, and recurrent UTI.  Patient admitted with fever, lethargic, poor oral intake, sepsis. She was admitted, started on IV fluids and IV antibiotics. Awaiting cultures results.    Assessment & Plan:   Active Problems:   NEOPLASM, MALIGNANT, BLADDER, HX OF   Urinary tract infection   Hypertension   CAD (coronary artery disease)   Alzheimer's dementia   UTI (lower urinary tract infection)   Acute encephalopathy   Sepsis ([place])   Altered mental state  1-Sepsis; E coli Bacteremia:  presents with fevers, lethargic, UA with too numerous to count WBC.  I will change Aztreonam to ancef. E coli sensitive to ancef. Could use Ceftin at discharge. Pharmacist review prior records and patient has tolerated cephalosporin in the past.  Urine culture growing multiple bacteria.  Blood culture ID p: gram negative rods. E coli, enterobacteriaceae. E coli sensitive to ancef.  Repeat blood culture 5-26 will follow.   2-Acute encephalopathy; lethargic on admission. Likely related to infection.  Treating for UTI/  More lethargic this am, and spike fever.   3-PNA/ Health care associated Vs aspiration Continue with  cefepime and vancomycin.  Spike fever today.  Speech swallow evaluation.   4-UTI; ancef change to cefepime to cover for PNA>   5-Tachycardia;  Sinus with PVC.  Continue with home dose atenolol. Monitor on telemetry   6-Alzheimer's dementia - stable anticipate some degree of sundowning while hospitalized   7-Hypophosphatemia resolved. Repeat labs tomorrow.   8-HTN; continue with lasix, change to daily. Continue with atenolol. IV PRN Hydralazine.  9-Anemia; acute illness and chronic. Follow trend.   DVT prophylaxis: Lovenox./   Code Status: DNR Family Communication: care giver at bedside. Husband at bedside.  Disposition Plan: home when afebrile and cultures available.    Consultants:   none  Procedures:  none  Antimicrobials:   Vancomycin 5-26  Cefepime 5-26   Subjective: Sleepy today, febrile.   Objective: Filed Vitals:   05/19/16 0537 05/19/16 1024 05/19/16 1049 05/19/16 1216  BP: 151/69     Pulse: 105     Temp: 98.1 F (36.7 C) 99.1 F (37.3 C) 101.2 F (38.4 C) 101.1 F (38.4 C)  TempSrc: Oral Axillary Rectal Rectal  Resp: 22     Height:      Weight:      SpO2: 93%       Intake/Output Summary (Last 24 hours) at 05/19/16 1308 Last data filed at 05/19/16 0906  Gross per 24 hour  Intake    420 ml  Output    950 ml  Net   -530 ml   Filed Weights   05/15/16 0245  Weight: 58.786 kg (129 lb 9.6 oz)    Examination:  General exam: Appears calm and comfortable , lethargic, say few words, fall back to sleep.  Respiratory system: Clear to auscultation. Respiratory effort normal. Cardiovascular system: S1 & S2 heard, RRR. No JVD, murmurs, rubs, gallops or clicks. No pedal edema. Gastrointestinal system: Abdomen is nondistended, soft and nontender. No organomegaly or masses felt. Normal bowel sounds heard. Extremities: no edema Skin: No rashes, lesions or ulcers     Data Reviewed: I have personally reviewed following labs and imaging studies  CBC:  Recent Labs Lab 05/14/16 2109 05/15/16 0350 05/17/16 0331  05/19/16 0531  WBC 8.9 9.4 6.9 7.0  NEUTROABS 6.7  --   --   --   HGB 10.1* 10.8* 10.3* 8.8*  HCT 30.2* 32.7* 31.1* 26.0*  MCV 95.0 99.1 96.6 95.2  PLT 109* 101* 96* [id number]*   Basic Metabolic Panel:  Recent Labs Lab 05/14/16 2109 05/15/16 0350 05/16/16 0954 05/17/16 0331 05/19/16 0527 05/19/16 0531  NA 137 136 138 138  --  143  K 4.2 4.1 3.9 3.4*  --  3.6  CL 106 107 110 110  --  112*  CO2 25 24 23 23   --  26  GLUCOSE 116* 115* 106* 104*  --  119*  BUN  26* 24* 20 17  --  17  CREATININE 0.63 0.60 0.57 0.53  --  0.53  CALCIUM 8.8* 8.7* 8.8* 8.5*  --  8.7*  MG  --  1.8  --   --  1.7  --   PHOS  --  2.1* 1.7* 1.7* 3.4  --    GFR: Estimated Creatinine Clearance: 44.6 mL/min (by C-G formula based on Cr of 0.53). Liver Function Tests:  Recent Labs Lab 05/14/16 2109 05/15/16 0350  AST 17 19  ALT 15 17  ALKPHOS 53 47  BILITOT 0.6 0.5  PROT 5.8* 5.7*  ALBUMIN 3.2* 3.1*   No results for input(s): LIPASE, AMYLASE in the last 168 hours. No results for input(s): AMMONIA in the last 168 hours. Coagulation Profile: No results for input(s): INR, PROTIME in the last 168 hours. Cardiac Enzymes: No results for input(s): CKTOTAL, CKMB, CKMBINDEX, TROPONINI in the last 168 hours. BNP (last 3 results) No results for input(s): PROBNP in the last 8760 hours. HbA1C: No results for input(s): HGBA1C in the last 72 hours. CBG: No results for input(s): GLUCAP in the last 168 hours. Lipid Profile: No results for input(s): CHOL, HDL, LDLCALC, TRIG, CHOLHDL, LDLDIRECT in the last 72 hours. Thyroid Function Tests: No results for input(s): TSH, T4TOTAL, FREET4, T3FREE, THYROIDAB in the last 72 hours. Anemia Panel: No results for input(s): VITAMINB12, FOLATE, FERRITIN, TIBC, IRON, RETICCTPCT in the last 72 hours. Sepsis Labs:  Recent Labs Lab 05/14/16 2132 05/15/16 0049 05/15/16 0350  PROCALCITON  --   --  0.99  LATICACIDVEN 1.00 1.13  --     Recent Results (from the past 240 hour(s))  Blood Culture (routine x 2)     Status: Abnormal   Collection Time: 05/14/16  9:09 PM  Result Value Ref Range Status   Specimen Description BLOOD LEFT ANTECUBITAL  Final   Special Requests BOTTLES DRAWN AEROBIC AND ANAEROBIC 5ML  Final   Culture  Setup Time   Final    GRAM NEGATIVE RODS ANAEROBIC BOTTLE ONLY CRITICAL RESULT CALLED TO, READ BACK BY AND VERIFIED WITH: T GREEN 05/15/16 @ 44 M VESTAL Performed at [place]  (A)  Final   Report Status 05/17/2016 FINAL  Final   Organism ID, Bacteria ESCHERICHIA COLI  Final      Susceptibility   Escherichia coli - MIC*    AMPICILLIN >=32 RESISTANT Resistant     CEFAZOLIN <=4 SENSITIVE Sensitive     CEFEPIME <=1 SENSITIVE Sensitive     CEFTAZIDIME <=1 SENSITIVE Sensitive     CEFTRIAXONE <=1 SENSITIVE Sensitive     CIPROFLOXACIN >=4 RESISTANT Resistant     GENTAMICIN <=1 SENSITIVE Sensitive     IMIPENEM <=0.25 SENSITIVE Sensitive     TRIMETH/SULFA <=20 SENSITIVE Sensitive  AMPICILLIN/SULBACTAM 8 SENSITIVE Sensitive     PIP/TAZO <=4 SENSITIVE Sensitive     * ESCHERICHIA COLI  Blood Culture ID Panel (Reflexed)     Status: Abnormal   Collection Time: 05/14/16  9:09 PM  Result Value Ref Range Status   Enterococcus species NOT DETECTED NOT DETECTED Final   Vancomycin resistance NOT DETECTED NOT DETECTED Final   Listeria monocytogenes NOT DETECTED NOT DETECTED Final   Staphylococcus species NOT DETECTED NOT DETECTED Final   Staphylococcus aureus NOT DETECTED NOT DETECTED Final   Methicillin resistance NOT DETECTED NOT DETECTED Final   Streptococcus species NOT DETECTED NOT DETECTED Final   Streptococcus agalactiae NOT DETECTED NOT DETECTED Final   Streptococcus pneumoniae NOT DETECTED NOT DETECTED Final   Streptococcus pyogenes NOT DETECTED NOT DETECTED Final   Acinetobacter baumannii NOT DETECTED NOT DETECTED Final   Enterobacteriaceae species DETECTED (A) NOT DETECTED Final    Comment: CRITICAL RESULT CALLED TO, READ BACK BY AND VERIFIED WITH: T GREEN 05/15/16 @ 1247 M VESTAL    Enterobacter cloacae complex NOT DETECTED NOT DETECTED Final   Escherichia coli DETECTED (A) NOT DETECTED Final    Comment: CRITICAL RESULT CALLED TO, READ BACK BY AND VERIFIED WITH: T GREEN 05/15/16 @ 44 M VESTAL    Klebsiella oxytoca NOT DETECTED NOT DETECTED Final   Klebsiella pneumoniae NOT DETECTED NOT DETECTED Final   Proteus species NOT DETECTED NOT DETECTED Final    Serratia marcescens NOT DETECTED NOT DETECTED Final   Carbapenem resistance NOT DETECTED NOT DETECTED Final   Haemophilus influenzae NOT DETECTED NOT DETECTED Final   Neisseria meningitidis NOT DETECTED NOT DETECTED Final   Pseudomonas aeruginosa NOT DETECTED NOT DETECTED Final   Candida albicans NOT DETECTED NOT DETECTED Final   Candida glabrata NOT DETECTED NOT DETECTED Final   Candida krusei NOT DETECTED NOT DETECTED Final   Candida parapsilosis NOT DETECTED NOT DETECTED Final   Candida tropicalis NOT DETECTED NOT DETECTED Final    Comment: Performed at [facility]  Blood Culture (routine x 2)     Status: Abnormal   Collection Time: 05/14/16  9:18 PM  Result Value Ref Range Status   Specimen Description BLOOD RIGHT ANTECUBITAL  Final   Special Requests BOTTLES DRAWN AEROBIC AND ANAEROBIC 5ML  Final   Culture  Setup Time   Final    GRAM NEGATIVE RODS ANAEROBIC BOTTLE ONLY CRITICAL RESULT CALLED TO, READ BACK BY AND VERIFIED WITH: T GREEN 05/15/16 @ 70 M VESTAL    Culture (A)  Final    ESCHERICHIA COLI SUSCEPTIBILITIES PERFORMED ON PREVIOUS CULTURE WITHIN THE LAST 5 DAYS. Performed at [facility]    Report Status 05/17/2016 FINAL  Final  Urine culture     Status: Abnormal   Collection Time: 05/14/16 11:14 PM  Result Value Ref Range Status   Specimen Description URINE, CLEAN CATCH  Final   Special Requests NONE  Final   Culture MULTIPLE SPECIES PRESENT, SUGGEST RECOLLECTION (A)  Final   Report Status 05/16/2016 FINAL  Final  Urine culture     Status: Abnormal   Collection Time: 05/16/16 10:52 AM  Result Value Ref Range Status   Specimen Description URINE, CLEAN CATCH  Final   Special Requests NONE  Final   Culture MULTIPLE SPECIES PRESENT, SUGGEST RECOLLECTION (A)  Final   Report Status 05/17/2016 FINAL  Final  Culture, blood (routine x 2)     Status: None (Preliminary result)   Collection Time: 05/18/16 10:58 AM  Result Value Ref  Range Status   Specimen  Description BLOOD LEFT ANTECUBITAL  Final   Special Requests BOTTLES DRAWN AEROBIC ONLY 5 CC  Final   Culture   Final    NO GROWTH < 24 HOURS Performed at [facility]    Report Status PENDING  Incomplete  Culture, blood (routine x 2)     Status: None (Preliminary result)   Collection Time: 05/18/16 10:58 AM  Result Value Ref Range Status   Specimen Description BLOOD RIGHT ANTECUBITAL  Final   Special Requests BOTTLES DRAWN AEROBIC AND ANAEROBIC 10 CC EA  Final   Culture   Final    NO GROWTH < 24 HOURS Performed at [facility]    Report Status PENDING  Incomplete         Radiology Studies: Dg Chest 2 View  05/18/2016  CLINICAL DATA:  Cough, lethargy, altered mental status EXAM: CHEST  2 VIEW COMPARISON:  05/14/2016 FINDINGS: Cardiomediastinal silhouette is stable. There is small bilateral pleural effusion with bilateral basilar atelectasis or infiltrate. Additional patchy airspace disease is noted in right upper lobe. Findings suspicious for pneumonia. Less likely asymmetric pulmonary edema. Clinical correlation is necessary IMPRESSION: There is small bilateral pleural effusion with bilateral basilar atelectasis or infiltrate. Additional patchy airspace disease is noted in right upper lobe. Findings suspicious for pneumonia. Less likely asymmetric pulmonary edema. Clinical correlation is necessary Electronically Signed   By: Lahoma Crocker M.D.   On: 05/18/2016 11:43        Scheduled Meds: . atenolol  25 mg Oral BID  . ceFEPime (MAXIPIME) IV  1 g Intravenous Q12H  . divalproex  250 mg Oral BID  . donepezil  10 mg Oral Daily  . enoxaparin (LOVENOX) injection  40 mg Subcutaneous Q24H  . feeding supplement (ENSURE ENLIVE)  237 mL Oral BID BM  . furosemide  20 mg Oral BID  . loratadine  10 mg Oral Daily  . memantine  28 mg Oral Daily  . nortriptyline  25 mg Oral QHS  . pantoprazole  20 mg Oral Daily  . polyethylene glycol  17 g Oral Daily  . temazepam  7.5 mg Oral  QHS  . vancomycin  500 mg Intravenous Q12H   Continuous Infusions:     LOS: 4 days    Time spent: 35 minutes.     Elmarie Shiley, MD [facility] Pager [phone]  If 7PM-7AM, please contact night-coverage [email or web address] Password [email or web address] 05/19/2016, 1:08 PM

## 2016-05-19 NOTE — Evaluation (Signed)
Clinical/Bedside Swallow Evaluation Patient Details  Name: [name] MRN: [id number] Date of Birth: 1926/02/08  Today's Date: 05/19/2016 Time: SLP Start Time (ACUTE ONLY): 1640 SLP Stop Time (ACUTE ONLY): 1702 SLP Time Calculation (min) (ACUTE ONLY): 22 min  Past Medical History:  Past Medical History  Diagnosis Date  . Bladder cancer [facility])     s/p cystectomy/ureterostomy  . Hypertension   . S/P appendectomy   . GERD (gastroesophageal reflux disease)   . Hyperlipidemia   . Dementia   . SVT (supraventricular tachycardia) ([place])   . Vitamin D deficiency   . Carotid stenosis   . Osteopenia   . Syncope   . Anxiety   . Cervical disc disease   . Pyelonephritis   . LBBB (left bundle branch block)   . Ventricular tachycardia ([place])   . CAD (coronary artery disease)   . PVC (premature ventricular contraction)   . Headache(784.0)   . Cervical spondylosis   . Heartburn   . Hydronephrosis   . Hypoglycemia   . Rectocele   . Microscopic hematuria   . Heart murmur   . Jejunostomy tube fell out ([place])   . Fall at home Nov. 9, 2014 and Dec. 2015    Pt was picking up her dog and fell and at home   Past Surgical History:  Past Surgical History  Procedure Laterality Date  . Total abdominal hysterectomy w/ bilateral salpingoophorectomy    . Hernia repair    . Cystectomy    . Ureterostomy    . Appendectom    . Cardiac catheterization    . Fracture surgery    . Abdominal hysterectomy     HPI:  82 y.o. female with medical history significant of falls Alzheimer's disease, bladder cancer status post urostomy, and recurrent UTI; CXR on 05/18/16 indicated There is small bilateral pleural effusion with bilateral basilar atelectasis or infiltrate. Additional patchy airspace disease is noted in right upper lobe. Findings suspicious for pneumonia. Less likely asymmetric pulmonary edema. Clinical correlation is necessary   Assessment / Plan / Recommendation Clinical Impression   Pt  exhibits oral holding and suspected delay in the initiation of the swallow with all consistencies with moderate verbal cues to swallow needed with all consistencies; prolonged oral transit with solids; no overt s/s of aspiration noted, but pt is at risk for aspiration d/t decreased cognition and hx of PNA; recommend Dysphagia 3 (mechanical/soft) diet with thin liquids in regulated amounts with swallowing precautions including slow rate/small bites and sips; ST will f/u in house for diet tolerance and pt/family education re: aspiration/swallowing precautions.    Aspiration Risk  Mild aspiration risk    Diet Recommendation   Dysphagia 3/thin (regulated amounts)  Medication Administration: Whole meds with puree    Other  Recommendations Oral Care Recommendations: Oral care BID   Follow up Recommendations  24 hour supervision/assistance    Frequency and Duration min 2x/week  1 week       Prognosis Prognosis for Safe Diet Advancement: Good Barriers to Reach Goals: Cognitive deficits      Swallow Study   General Date of Onset: 05/14/16 HPI: 82 y.o. female with medical history significant of falls Alzheimer's disease, bladder cancer status post urostomy, and recurrent UTI Type of Study: Bedside Swallow Evaluation Previous Swallow Assessment: n/a Diet Prior to this Study: Regular;Thin liquids Temperature Spikes Noted: Yes Respiratory Status: Room air History of Recent Intubation: No Behavior/Cognition: Alert;Cooperative;Pleasant mood;Confused Oral Cavity Assessment: Within Functional Limits Oral Care Completed by SLP:  No Oral Cavity - Dentition: Adequate natural dentition Vision: Functional for self-feeding Self-Feeding Abilities: Needs assist;Needs set up Patient Positioning: Upright in bed Baseline Vocal Quality: Low vocal intensity Volitional Cough: Cognitively unable to elicit Volitional Swallow: Able to elicit    Oral/Motor/Sensory Function Overall Oral Motor/Sensory Function:  Other (comment) (difficult to assess d/t mentation)   Ice Chips Ice chips: Impaired Presentation: Spoon Oral Phase Functional Implications: Oral holding Pharyngeal Phase Impairments: Suspected delayed Swallow   Thin Liquid Thin Liquid: Impaired Presentation: Cup;Straw Oral Phase Functional Implications: Oral holding Pharyngeal  Phase Impairments: Suspected delayed Swallow    Nectar Thick Nectar Thick Liquid: Not tested   Honey Thick Honey Thick Liquid: Not tested   Puree Puree: Impaired Presentation: Spoon Oral Phase Functional Implications: Prolonged oral transit;Oral holding Pharyngeal Phase Impairments: Suspected delayed Swallow   Solid      Solid: Impaired Presentation: Spoon Oral Phase Functional Implications: Prolonged oral transit;Oral holding Pharyngeal Phase Impairments: Suspected delayed Swallow        ADAMS,PAT, M.S., CCC-SLP 05/19/2016,5:17 PM

## 2016-05-20 LAB — BASIC METABOLIC PANEL
Anion gap: 6 (ref 5–15)
BUN: 23 mg/dL — ABNORMAL HIGH (ref 6–20)
CALCIUM: 8.7 mg/dL — AB (ref 8.9–10.3)
CHLORIDE: 110 mmol/L (ref 101–111)
CO2: 26 mmol/L (ref 22–32)
CREATININE: 0.52 mg/dL (ref 0.44–1.00)
Glucose, Bld: 116 mg/dL — ABNORMAL HIGH (ref 65–99)
Potassium: 3.7 mmol/L (ref 3.5–5.1)
SODIUM: 142 mmol/L (ref 135–145)

## 2016-05-20 LAB — CBC
HCT: 25 % — ABNORMAL LOW (ref 36.0–46.0)
HEMOGLOBIN: 8.2 g/dL — AB (ref 12.0–15.0)
MCH: 31.5 pg (ref 26.0–34.0)
MCHC: 32.8 g/dL (ref 30.0–36.0)
MCV: 96.2 fL (ref 78.0–100.0)
PLATELETS: 136 10*3/uL — AB (ref 150–400)
RBC: 2.6 MIL/uL — ABNORMAL LOW (ref 3.87–5.11)
RDW: 14.6 % (ref 11.5–15.5)
WBC: 6.8 10*3/uL (ref 4.0–10.5)

## 2016-05-20 MED ORDER — POTASSIUM CHLORIDE CRYS ER 20 MEQ PO TBCR
20.0000 meq | EXTENDED_RELEASE_TABLET | Freq: Once | ORAL | Status: AC
  Administered 2016-05-20: 20 meq via ORAL
  Filled 2016-05-20: qty 1

## 2016-05-20 MED ORDER — FUROSEMIDE 20 MG PO TABS
20.0000 mg | ORAL_TABLET | Freq: Every day | ORAL | Status: DC
  Administered 2016-05-20 – 2016-05-21 (×2): 20 mg via ORAL
  Filled 2016-05-20 (×2): qty 1

## 2016-05-20 NOTE — Progress Notes (Signed)
PROGRESS NOTE    [name]  [id number] DOB: [birth year] DOA: 05/14/2016 PCP: Marton Redwood, MD   Brief Narrative: [name] is a 82 y.o. female with medical history significant of falls Alzheimer's disease, bladder cancer status post urostomy, and recurrent UTI.  Patient admitted with fever, lethargic, poor oral intake, sepsis. She was admitted, started on IV fluids and IV antibiotics. Awaiting cultures results.    Assessment & Plan:   Active Problems:   NEOPLASM, MALIGNANT, BLADDER, HX OF   Urinary tract infection   Hypertension   CAD (coronary artery disease)   Alzheimer's dementia   UTI (lower urinary tract infection)   Acute encephalopathy   Sepsis ([place])   Altered mental state  1-Sepsis; E coli Bacteremia:  presents with fevers, lethargic, UA with too numerous to count WBC.  E coli sensitive to ancef, cefepime . Could use Ceftin at discharge. Pharmacist review prior records and patient has tolerated cephalosporin in the past.  Urine culture growing multiple bacteria.  Blood culture ID p: gram negative rods. E coli, enterobacteriaceae. E coli sensitive to ancef.  Repeat blood culture 5-26 no growth   2-Acute encephalopathy; lethargic on admission. Likely related to infection.  Treating for UTI/  More alert this am. Continue treatment for infection   3-PNA/ Health care associated Vs aspiration Continue with  cefepime and vancomycin.  Speech swallow evaluation.   4-UTI; ancef change to cefepime to cover for PNA>   5-Tachycardia;  Sinus with PVC.  Continue with home dose atenolol. Monitor on telemetry   6-Alzheimer's dementia - stable anticipate some degree of sundowning while hospitalized   7-Hypophosphatemia resolved.   8-HTN; continue with lasix, change to daily. Continue with atenolol. IV PRN Hydralazine.  9-Anemia; acute illness and chronic. Follow trend.   DVT prophylaxis: Lovenox./  Code Status: DNR Family Communication: care giver at  bedside. Husband at bedside.  Disposition Plan: home when afebrile and cultures available.    Consultants:   none  Procedures:  none  Antimicrobials:   Vancomycin 5-26  Cefepime 5-26   Subjective: More alert, does not feels well.   Objective: Filed Vitals:   05/19/16 2157 05/20/16 0015 05/20/16 0653 05/20/16 0820  BP: 130/47  138/47   Pulse: 84  86   Temp: 99.1 F (37.3 C) 100.5 F (38.1 C) 98.6 F (37 C) 100.8 F (38.2 C)  TempSrc: Axillary Rectal Oral Rectal  Resp: 22  20   Height:      Weight:      SpO2: 90% 92% 92%     Intake/Output Summary (Last 24 hours) at 05/20/16 1331 Last data filed at 05/20/16 0653  Gross per 24 hour  Intake    300 ml  Output   1352 ml  Net  -1052 ml   Filed Weights   05/15/16 0245  Weight: 58.786 kg (129 lb 9.6 oz)    Examination:  General exam: Appears calm and comfortable , alert  Respiratory system: Clear to auscultation. Respiratory effort normal. Cardiovascular system: S1 & S2 heard, RRR. No JVD, murmurs, rubs, gallops or clicks. No pedal edema. Gastrointestinal system: Abdomen is nondistended, soft and nontender. No organomegaly or masses felt. Normal bowel sounds heard. Extremities: no edema Skin: No rashes, lesions or ulcers     Data Reviewed: I have personally reviewed following labs and imaging studies  CBC:  Recent Labs Lab 05/14/16 2109 05/15/16 0350 05/17/16 0331 05/19/16 0531 05/20/16 0523  WBC 8.9 9.4 6.9 7.0 6.8  NEUTROABS 6.7  --   --   --   --  HGB 10.1* 10.8* 10.3* 8.8* 8.2*  HCT 30.2* 32.7* 31.1* 26.0* 25.0*  MCV 95.0 99.1 96.6 95.2 96.2  PLT 109* 101* 96* 118* [id number]*   Basic Metabolic Panel:  Recent Labs Lab 05/15/16 0350 05/16/16 0954 05/17/16 0331 05/19/16 0527 05/19/16 0531 05/20/16 0523  NA 136 138 138  --  143 142  K 4.1 3.9 3.4*  --  3.6 3.7  CL 107 110 110  --  112* 110  CO2 24 23 23   --  26 26  GLUCOSE 115* 106* 104*  --  119* 116*  BUN 24* 20 17  --  17 23*    CREATININE 0.60 0.57 0.53  --  0.53 0.52  CALCIUM 8.7* 8.8* 8.5*  --  8.7* 8.7*  MG 1.8  --   --  1.7  --   --   PHOS 2.1* 1.7* 1.7* 3.4  --   --    GFR: Estimated Creatinine Clearance: 44.6 mL/min (by C-G formula based on Cr of 0.52). Liver Function Tests:  Recent Labs Lab 05/14/16 2109 05/15/16 0350  AST 17 19  ALT 15 17  ALKPHOS 53 47  BILITOT 0.6 0.5  PROT 5.8* 5.7*  ALBUMIN 3.2* 3.1*   No results for input(s): LIPASE, AMYLASE in the last 168 hours. No results for input(s): AMMONIA in the last 168 hours. Coagulation Profile: No results for input(s): INR, PROTIME in the last 168 hours. Cardiac Enzymes: No results for input(s): CKTOTAL, CKMB, CKMBINDEX, TROPONINI in the last 168 hours. BNP (last 3 results) No results for input(s): PROBNP in the last 8760 hours. HbA1C: No results for input(s): HGBA1C in the last 72 hours. CBG: No results for input(s): GLUCAP in the last 168 hours. Lipid Profile: No results for input(s): CHOL, HDL, LDLCALC, TRIG, CHOLHDL, LDLDIRECT in the last 72 hours. Thyroid Function Tests: No results for input(s): TSH, T4TOTAL, FREET4, T3FREE, THYROIDAB in the last 72 hours. Anemia Panel: No results for input(s): VITAMINB12, FOLATE, FERRITIN, TIBC, IRON, RETICCTPCT in the last 72 hours. Sepsis Labs:  Recent Labs Lab 05/14/16 2132 05/15/16 0049 05/15/16 0350  PROCALCITON  --   --  0.99  LATICACIDVEN 1.00 1.13  --     Recent Results (from the past 240 hour(s))  Blood Culture (routine x 2)     Status: Abnormal   Collection Time: 05/14/16  9:09 PM  Result Value Ref Range Status   Specimen Description BLOOD LEFT ANTECUBITAL  Final   Special Requests BOTTLES DRAWN AEROBIC AND ANAEROBIC 5ML  Final   Culture  Setup Time   Final    GRAM NEGATIVE RODS ANAEROBIC BOTTLE ONLY CRITICAL RESULT CALLED TO, READ BACK BY AND VERIFIED WITH: T GREEN 05/15/16 @ 80 M VESTAL Performed at [place] (A)  Final   Report  Status 05/17/2016 FINAL  Final   Organism ID, Bacteria ESCHERICHIA COLI  Final      Susceptibility   Escherichia coli - MIC*    AMPICILLIN >=32 RESISTANT Resistant     CEFAZOLIN <=4 SENSITIVE Sensitive     CEFEPIME <=1 SENSITIVE Sensitive     CEFTAZIDIME <=1 SENSITIVE Sensitive     CEFTRIAXONE <=1 SENSITIVE Sensitive     CIPROFLOXACIN >=4 RESISTANT Resistant     GENTAMICIN <=1 SENSITIVE Sensitive     IMIPENEM <=0.25 SENSITIVE Sensitive     TRIMETH/SULFA <=20 SENSITIVE Sensitive     AMPICILLIN/SULBACTAM 8 SENSITIVE Sensitive     PIP/TAZO <=4 SENSITIVE Sensitive     *  ESCHERICHIA COLI  Blood Culture ID Panel (Reflexed)     Status: Abnormal   Collection Time: 05/14/16  9:09 PM  Result Value Ref Range Status   Enterococcus species NOT DETECTED NOT DETECTED Final   Vancomycin resistance NOT DETECTED NOT DETECTED Final   Listeria monocytogenes NOT DETECTED NOT DETECTED Final   Staphylococcus species NOT DETECTED NOT DETECTED Final   Staphylococcus aureus NOT DETECTED NOT DETECTED Final   Methicillin resistance NOT DETECTED NOT DETECTED Final   Streptococcus species NOT DETECTED NOT DETECTED Final   Streptococcus agalactiae NOT DETECTED NOT DETECTED Final   Streptococcus pneumoniae NOT DETECTED NOT DETECTED Final   Streptococcus pyogenes NOT DETECTED NOT DETECTED Final   Acinetobacter baumannii NOT DETECTED NOT DETECTED Final   Enterobacteriaceae species DETECTED (A) NOT DETECTED Final    Comment: CRITICAL RESULT CALLED TO, READ BACK BY AND VERIFIED WITH: T GREEN 05/15/16 @ 1247 M VESTAL    Enterobacter cloacae complex NOT DETECTED NOT DETECTED Final   Escherichia coli DETECTED (A) NOT DETECTED Final    Comment: CRITICAL RESULT CALLED TO, READ BACK BY AND VERIFIED WITH: T GREEN 05/15/16 @ 58 M VESTAL    Klebsiella oxytoca NOT DETECTED NOT DETECTED Final   Klebsiella pneumoniae NOT DETECTED NOT DETECTED Final   Proteus species NOT DETECTED NOT DETECTED Final   Serratia marcescens  NOT DETECTED NOT DETECTED Final   Carbapenem resistance NOT DETECTED NOT DETECTED Final   Haemophilus influenzae NOT DETECTED NOT DETECTED Final   Neisseria meningitidis NOT DETECTED NOT DETECTED Final   Pseudomonas aeruginosa NOT DETECTED NOT DETECTED Final   Candida albicans NOT DETECTED NOT DETECTED Final   Candida glabrata NOT DETECTED NOT DETECTED Final   Candida krusei NOT DETECTED NOT DETECTED Final   Candida parapsilosis NOT DETECTED NOT DETECTED Final   Candida tropicalis NOT DETECTED NOT DETECTED Final    Comment: Performed at [facility]  Blood Culture (routine x 2)     Status: Abnormal   Collection Time: 05/14/16  9:18 PM  Result Value Ref Range Status   Specimen Description BLOOD RIGHT ANTECUBITAL  Final   Special Requests BOTTLES DRAWN AEROBIC AND ANAEROBIC 5ML  Final   Culture  Setup Time   Final    GRAM NEGATIVE RODS ANAEROBIC BOTTLE ONLY CRITICAL RESULT CALLED TO, READ BACK BY AND VERIFIED WITH: T GREEN 05/15/16 @ 80 M VESTAL    Culture (A)  Final    ESCHERICHIA COLI SUSCEPTIBILITIES PERFORMED ON PREVIOUS CULTURE WITHIN THE LAST 5 DAYS. Performed at [facility]    Report Status 05/17/2016 FINAL  Final  Urine culture     Status: Abnormal   Collection Time: 05/14/16 11:14 PM  Result Value Ref Range Status   Specimen Description URINE, CLEAN CATCH  Final   Special Requests NONE  Final   Culture MULTIPLE SPECIES PRESENT, SUGGEST RECOLLECTION (A)  Final   Report Status 05/16/2016 FINAL  Final  Urine culture     Status: Abnormal   Collection Time: 05/16/16 10:52 AM  Result Value Ref Range Status   Specimen Description URINE, CLEAN CATCH  Final   Special Requests NONE  Final   Culture MULTIPLE SPECIES PRESENT, SUGGEST RECOLLECTION (A)  Final   Report Status 05/17/2016 FINAL  Final  Culture, blood (routine x 2)     Status: None (Preliminary result)   Collection Time: 05/18/16 10:58 AM  Result Value Ref Range Status   Specimen Description BLOOD  LEFT ANTECUBITAL  Final   Special Requests BOTTLES  DRAWN AEROBIC ONLY 5 CC  Final   Culture   Final    NO GROWTH < 24 HOURS Performed at [facility]    Report Status PENDING  Incomplete  Culture, blood (routine x 2)     Status: None (Preliminary result)   Collection Time: 05/18/16 10:58 AM  Result Value Ref Range Status   Specimen Description BLOOD RIGHT ANTECUBITAL  Final   Special Requests BOTTLES DRAWN AEROBIC AND ANAEROBIC 10 CC EA  Final   Culture   Final    NO GROWTH < 24 HOURS Performed at [facility]    Report Status PENDING  Incomplete         Radiology Studies: No results found.      Scheduled Meds: . atenolol  25 mg Oral BID  . ceFEPime (MAXIPIME) IV  1 g Intravenous Q12H  . divalproex  250 mg Oral BID  . donepezil  10 mg Oral Daily  . enoxaparin (LOVENOX) injection  40 mg Subcutaneous Q24H  . feeding supplement (ENSURE ENLIVE)  237 mL Oral BID BM  . furosemide  20 mg Oral Daily  . loratadine  10 mg Oral Daily  . memantine  28 mg Oral Daily  . nortriptyline  25 mg Oral QHS  . pantoprazole  20 mg Oral Daily  . polyethylene glycol  17 g Oral Daily  . temazepam  7.5 mg Oral QHS  . vancomycin  500 mg Intravenous Q12H   Continuous Infusions:     LOS: 5 days    Time spent: 35 minutes.     Elmarie Shiley, MD [facility] Pager [phone]  If 7PM-7AM, please contact night-coverage [email or web address] Password [email or web address] 05/20/2016, 1:31 PM

## 2016-05-20 NOTE — Clinical Social Work Note (Signed)
Clinical Social Work Assessment  Patient Details  Name: [name] MRN: [id number] Date of Birth: 1926/01/10  Date of referral:  05/20/16               Reason for consult:  Facility Placement                Permission sought to share information with:  Case Manager, Other ([place]) Permission granted to share information::  Yes, Verbal Permission Granted (By husband- patient is not oriented)           Housing/Transportation Living arrangements for the past 2 months:  Single Family Home Source of Information:  Spouse Patient Interpreter Needed:  None Criminal Activity/Legal Involvement Pertinent to Current Situation/Hospitalization:  No - Comment as needed Significant Relationships:  Spouse Lives with:  Spouse Do you feel safe going back to the place where you live?  Yes Need for family participation in patient care:  Yes (Comment)  Care giving concerns: Husband states he has 5 hired women who are able to provide 24 hour care for patient at home 7 days a week.  States he is 58 and cannot provide her care.  Feels the caregivers are very competent and caring towards patient.  Social Worker assessment / plan:  CSW spoke with patient's husband [name] via telephone this morning re: d/c disposition.  Very pleasant gentleman who indicated that he is anxious for patient to return home and feels that he has sufficient help at home to provide for her care. He is not interested in SNF placement and states that despite her dementia- he is able to care for her at home with 24 hour care.  Nursing reports concerns about her swallowing difficulties; husband is aware and has discussed with MD.    Employment status:  Retired Nurse, adult PT Recommendations:  Home with [place] / Referral to community resources:   (None)  Patient/Family's Response to care:  Husband is extremely complimentary of the care provided patient during this hospitalization  and was pleased also with CSW's call concern after care needs.    Patient/Family's Understanding of and Emotional Response to Diagnosis, Current Treatment, and Prognosis:  Husband is able to indicate a very good understanding of his wife's care needs and feels that current home arrangement is successful.  He has been involved with discussions with MD and nursing staff.  Husband is interested in home health for PT and RN (possible ST if indicated) at d/c.    Emotional Assessment Appearance:  Appears stated age Attitude/Demeanor/Rapport:  Unable to Assess (Patient sleeping soundly) Affect (typically observed):  Unable to Assess (Patient sleeping soundly) Orientation:   (Disoriented X4) Alcohol / Substance use:  Tobacco Use (Quit smoking 66 yrs ago per Med Rec report) Psych involvement (Current and /or in the community):  No (Comment)  Discharge Needs  Concerns to be addressed:  Care Coordination Readmission within the last 30 days:  No Current discharge risk:  None Barriers to Discharge:  No Barriers Identified, Continued Medical Work up   Kendell Bane T, LCSW 05/20/2016, 2:19 PM

## 2016-05-20 NOTE — Progress Notes (Signed)
Pt pocketing and holding crushed meds/applesauce in mouth and unable to initiate swallowing for HS meds (including Depakote).  Day RN reported that occasionally pt was able to swallow during day shift and at other times would hold in mouth and not able to swallow.  Mouth care provided.

## 2016-05-21 LAB — BASIC METABOLIC PANEL
Anion gap: 7 (ref 5–15)
BUN: 22 mg/dL — ABNORMAL HIGH (ref 6–20)
CHLORIDE: 108 mmol/L (ref 101–111)
CO2: 26 mmol/L (ref 22–32)
Calcium: 8.6 mg/dL — ABNORMAL LOW (ref 8.9–10.3)
Creatinine, Ser: 0.5 mg/dL (ref 0.44–1.00)
Glucose, Bld: 118 mg/dL — ABNORMAL HIGH (ref 65–99)
Potassium: 4.4 mmol/L (ref 3.5–5.1)
SODIUM: 141 mmol/L (ref 135–145)

## 2016-05-21 LAB — CBC
HCT: 26 % — ABNORMAL LOW (ref 36.0–46.0)
HEMOGLOBIN: 8.6 g/dL — AB (ref 12.0–15.0)
MCH: 32 pg (ref 26.0–34.0)
MCHC: 33.1 g/dL (ref 30.0–36.0)
MCV: 96.7 fL (ref 78.0–100.0)
Platelets: 171 10*3/uL (ref 150–400)
RBC: 2.69 MIL/uL — AB (ref 3.87–5.11)
RDW: 14.5 % (ref 11.5–15.5)
WBC: 7.4 10*3/uL (ref 4.0–10.5)

## 2016-05-21 LAB — VANCOMYCIN, TROUGH: VANCOMYCIN TR: 7 ug/mL — AB (ref 10.0–20.0)

## 2016-05-21 MED ORDER — ACETAMINOPHEN 325 MG PO TABS
650.0000 mg | ORAL_TABLET | Freq: Two times a day (BID) | ORAL | Status: DC
  Administered 2016-05-21: 650 mg via ORAL
  Filled 2016-05-21: qty 2

## 2016-05-21 MED ORDER — VANCOMYCIN HCL IN DEXTROSE 750-5 MG/150ML-% IV SOLN
750.0000 mg | Freq: Two times a day (BID) | INTRAVENOUS | Status: DC
  Administered 2016-05-21: 750 mg via INTRAVENOUS
  Filled 2016-05-21 (×2): qty 150

## 2016-05-21 NOTE — Progress Notes (Signed)
Speech Language Pathology Treatment: Dysphagia  Patient Details Name: [name] MRN: [id number] DOB: 1926-05-07 Today's Date: 05/21/2016 Time: [id number] SLP Time Calculation (min) (ACUTE ONLY): 30 min  Assessment / Plan / Recommendation Clinical Impression  Pt seen to assess tolerance of po diet and for education to aspiration mitigation strategies.     Pt's caregiver present Tye [place] and she denies pt having difficulties with swallowing at home.  She does admit to pt orally holding at times.  SLP reviewed with pt and caregiver results of MBS from 2013 and pt with throat clearing before and cough x1 during MBS but no aspiration at that time.  Pt appears well nourished and has not been in the hospital for over a year per caregiver!    SLP observed pt consume Ensure mixed with vanilla icecream via straw.  Decreased labial seal noted with pt benefiting from verbal/visual cues.  She responds well to her familiar caregiver as well.   RN observed giving pt medication crushed with applesauce - significant oral holding noted with pt "swishing" water orally prior to finally swallowing after much cueing from caregiver. Reviewed gustatory changes and sensory changes with dementia related swallowing using teach back.    Advised to fluctuating swallow ability with pt's who have dementia and advised to take advantage when pt fully alert and accepting of intake.  Caregiver reports pt eats well usually. She also feeds herself normally but has needed assistance during hospital.    Recommend continue dys3/thin diet with strict precautions.  Pt/caregivers may benefit from short term follow up SLP for education.         HPI HPI: 82 y.o. female with medical history significant of falls Alzheimer's disease, bladder cancer status post urostomy, and recurrent UTI.  Pt also with h/o reflux and dysphagia - MBS conducted 2013 negative for significant oropharyngeal deficits.        SLP Plan  Continue with  current plan of care     Recommendations  Diet recommendations: Dysphagia 3 (mechanical soft);Thin liquid;Nectar-thick liquid Liquids provided via: Straw;Cup Medication Administration: Crushed with puree Supervision: Full supervision/cueing for compensatory strategies;Trained caregiver to feed patient Compensations: Slow rate;Small sips/bites;Follow solids with liquid;Minimize environmental distractions;Lingual sweep for clearance of pocketing Postural Changes and/or Swallow Maneuvers: Seated upright 90 degrees;Upright 30-60 min after meal             Oral Care Recommendations: Oral care BID Follow up Recommendations: Home health SLP Plan: Continue with current plan of care     [place], [facility] SLP [phone]

## 2016-05-21 NOTE — Progress Notes (Signed)
Physical Therapy Treatment Patient Details Name: [name] MRN: [id number] DOB: Mar 27, 1926 Today's Date: 05/21/2016    History of Present Illness 82 y.o. female with medical history significant of falls Alzheimer's disease, bladder cancer status post urostomy, and recurrent UTI and admitted for sepsis    PT Comments    Pt progressing poorly.  Still requires + 2 total assist for transfers and is even too weak to sit EOB without assist.  Assisted to recliner required + 2 total assist.  Pt too weak to stand and support her own weight.    Follow Up Recommendations  SNF     Equipment Recommendations       Recommendations for Other Services       Precautions / Restrictions Precautions Precautions: Fall Precaution Comments: Hx dementia Restrictions Weight Bearing Restrictions: No    Mobility  Bed Mobility Overal bed mobility: Needs Assistance;+2 for physical assistance Bed Mobility: Supine to Sit     Supine to sit: +2 for physical assistance;Total assist (Pt 5%)     General bed mobility comments: difficult to maintain arousal.  Groggy/sleepy.  Poor eye contact.  Speaking few words but not in correct content.  sat EOB x 3 min required MAX assist.   Difficulty keeping her head up.  VERY WEAK.   Transfers Overall transfer level: Needs assistance Equipment used: None Transfers: Stand Pivot Transfers   Stand pivot transfers: Total assist;+2 physical assistance;+2 safety/equipment       General transfer comment: Total Assist "Bear Hug" 1/4 pivot from elevated bed to recliner pt 0%.  VERY WEAK.  Rec Hoyer back to bed.  Repotere to NT.    Ambulation/Gait                 Stairs            Wheelchair Mobility    Modified Rankin (Stroke Patients Only)       Balance                                    Cognition Arousal/Alertness: Lethargic (groggy)                          Exercises      General Comments         Pertinent Vitals/Pain Pain Assessment: No/denies pain    Home Living                      Prior Function            PT Goals (current goals can now be found in the care plan section) Progress towards PT goals: Progressing toward goals    Frequency  Min 3X/week    PT Plan Current plan remains appropriate    Co-evaluation             End of Session Equipment Utilized During Treatment: Gait belt Activity Tolerance: Treatment limited secondary to medical complications (Comment) (sepsis) Patient left: in chair;with call bell/phone within reach;with family/visitor present     Time: [id number] PT Time Calculation (min) (ACUTE ONLY): 25 min  Charges:  $Therapeutic Activity: 23-37 mins                    G Codes:      Rica Koyanagi  PTA [facility]  Acute  Rehab Pager      [phone]

## 2016-05-21 NOTE — Progress Notes (Signed)
Pharmacy Antibiotic Note  [name] is a 82 y.o. female admitted on 05/14/2016 with sepsis/ E coli bacteremia.  Today a chest x-ray showed possible pneumonia .  Pharmacy has been consulted for Vancomycin and cefepime dosing.  Plan: - Vanc trough subtherapeutic - Increase Vanc from 500mg  q12 to 750mg  q12 - will be more conservative in increase due to age of 82 yo - Continue cefepime 1gm IV q12h (this will cover the ecoli from previous blood cultures)  - Follow cultures, renal function, length of therapy   Height: 5\' 5"  (165.1 cm) Weight: 129 lb 9.6 oz (58.786 kg) IBW/kg (Calculated) : 57  Temp (24hrs), Avg:98.8 F (37.1 C), Min:98.4 F (36.9 C), Max:99 F (37.2 C)   Recent Labs Lab 05/14/16 2132 05/15/16 0049 05/15/16 0350 05/16/16 0954 05/17/16 0331 05/19/16 0531 05/20/16 0523 05/21/16 0438 05/21/16 1320  WBC  --   --  9.4  --  6.9 7.0 6.8 7.4  --   CREATININE  --   --  0.60 0.57 0.53 0.53 0.52 0.50  --   LATICACIDVEN 1.00 1.13  --   --   --   --   --   --   --   VANCOTROUGH  --   --   --   --   --   --   --   --  7*    Estimated Creatinine Clearance: 44.6 mL/min (by C-G formula based on Cr of 0.5).    Allergies  Allergen Reactions  . Penicillins Anaphylaxis    Severe edema, severe difficulty breathing, anaphylactic reaction. Per older [employer]film/video) records - patient has tolerated multiple cephalosporins (ceftriaxone, cefepime, cefuroxime)  . Bethanechol Chloride     Chest pains. Coronary spasms  . Celecoxib     Celebrex.  Severe itching.   . Citalopram Hydrobromide     Hyper, nervousness  . Clindamycin     Itching, coughing  . Codeine     tachycardia  . Desipramine     Severe tachycardia  . Epinephrine     Severe shortness of breath.   . Erythromycin     Burning, itching  . Floxin [Ocuflox]     itching  . Glucose     Sugar.  Weakness,faintness  . Hydromorphone Hcl     Dilaudid.  Comatose-Anaphylactic reaction.   . Ibuprofen     Tachycardia,  weakness  . Iodine     Hives, edema, tachycardia  . Meperidine Hcl     Demerol.  Tachycardia, extreme nervousness.   . Morphine     Tachycardia, hallucinations, extreme nervousness.  . Paroxetine Hcl     Extreme nervousness  . Procaine Hcl     Novocain. Severe shortness of breath.  . Propranolol Hcl     Inderal.  Extreme low blood pressure.   . Red Dye     Severe itching  . Restoril     Temazepam.  Extreme nervousness, sleeplessness  . Scopolamine     Irrational, loss of memory  . Sulfonamide Derivatives     Hives, edema  . Tetracycline     Severe hives, edema  . Trazodone And Nefazodone     Very hyper  . Valium     itching    Antimicrobials this admission: 5/22 levaquin >> 5/23  5/23 azactam >> 5/25  5/25 cefazolin >> 5/26 5/26 vancomycin >> 5/26 cefepime >>  Dose adjustments this admission:   Microbiology results: 5/22 BCx: e. Coli (R to amp, cipro)  5/22 UCx: multiple  species present suggest recollection 5/26 BCx : sent   Thank you for allowing pharmacy to be a part of this patient's care.  Adrian Saran, PharmD, BCPS Pager [phone] 05/21/2016 2:53 PM

## 2016-05-21 NOTE — Progress Notes (Signed)
PROGRESS NOTE    [name]  [id number] DOB: 1926-07-11 DOA: 05/14/2016 PCP: Marton Redwood, MD   Brief Narrative: [name] is a 82 y.o. female with medical history significant of falls Alzheimer's disease, bladder cancer status post urostomy, and recurrent UTI.  Patient admitted with fever, lethargic, poor oral intake, sepsis. She was admitted, started on IV fluids and IV antibiotics. Awaiting cultures results.    Assessment & Plan:   Active Problems:   NEOPLASM, MALIGNANT, BLADDER, HX OF   Urinary tract infection   Hypertension   CAD (coronary artery disease)   Alzheimer's dementia   UTI (lower urinary tract infection)   Acute encephalopathy   Sepsis ([place])   Altered mental state  1-Sepsis; E coli Bacteremia:  presents with fevers, lethargic, UA with too numerous to count WBC.  E coli sensitive to ancef, cefepime . Could use Ceftin at discharge. Pharmacist review prior records and patient has tolerated cephalosporin in the past.  Urine culture growing multiple bacteria.  Blood culture ID p: gram negative rods. E coli, enterobacteriaceae. E coli sensitive to ancef.  Repeat blood culture 5-26 no growth  She will needs 2 weeks of treatment counting from 5-26.   2-Acute encephalopathy; lethargic on admission. Likely related to infection.  Treating for UTI/  Improved today   3-PNA/ Health care associated Vs aspiration Continue with  cefepime and vancomycin.  Speech swallow evaluation. Day 4 antibiotic.s   4-UTI; ancef change to cefepime to cover for PNA>   5-Tachycardia;  Sinus with PVC.  Continue with home dose atenolol. Monitor on telemetry   6-Alzheimer's dementia - stable anticipate some degree of sundowning while hospitalized   7-Hypophosphatemia resolved.   8-HTN; continue with lasix, change to daily. Continue with atenolol. IV PRN Hydralazine.  9-Anemia; acute illness and chronic. Follow trend.   DVT prophylaxis: Lovenox./  Code Status:  DNR Family Communication: care giver at bedside.  Disposition Plan: home 5-30 if remain afebrile.    Consultants:   none  Procedures:  none  Antimicrobials:   Vancomycin 5-26  Cefepime 5-26   Subjective: More alert, today, per staff patient sat in chair with assistance of therapist.   Objective: Filed Vitals:   05/20/16 1406 05/20/16 2104 05/21/16 0618 05/21/16 1254  BP: 123/49 132/55 132/47 133/48  Pulse: 86 96 108 93  Temp: 98.3 F (36.8 C) 98.4 F (36.9 C) 99 F (37.2 C) 98.9 F (37.2 C)  TempSrc: Oral Oral Oral Oral  Resp: 18 22 24 20   Height:      Weight:      SpO2: 93% 96% 90% 92%    Intake/Output Summary (Last 24 hours) at 05/21/16 1807 Last data filed at 05/21/16 1447  Gross per 24 hour  Intake   2410 ml  Output   1950 ml  Net    460 ml   Filed Weights   05/15/16 0245  Weight: 58.786 kg (129 lb 9.6 oz)    Examination:  General exam: Appears calm and comfortable , alert  Respiratory system: Clear to auscultation. Respiratory effort normal. Cardiovascular system: S1 & S2 heard, RRR. No JVD, murmurs, rubs, gallops or clicks. No pedal edema. Gastrointestinal system: Abdomen is nondistended, soft and nontender. No organomegaly or masses felt. Normal bowel sounds heard. Extremities: no edema Skin: No rashes, lesions or ulcers     Data Reviewed: I have personally reviewed following labs and imaging studies  CBC:  Recent Labs Lab 05/14/16 2109 05/15/16 0350 05/17/16 0331 05/19/16 0531 05/20/16 [id number]  05/21/16 0438  WBC 8.9 9.4 6.9 7.0 6.8 7.4  NEUTROABS 6.7  --   --   --   --   --   HGB 10.1* 10.8* 10.3* 8.8* 8.2* 8.6*  HCT 30.2* 32.7* 31.1* 26.0* 25.0* 26.0*  MCV 95.0 99.1 96.6 95.2 96.2 96.7  PLT 109* 101* 96* 118* 136* [id number]   Basic Metabolic Panel:  Recent Labs Lab 05/15/16 0350 05/16/16 0954 05/17/16 0331 05/19/16 0527 05/19/16 0531 05/20/16 0523 05/21/16 0438  NA 136 138 138  --  143 142 141  K 4.1 3.9 3.4*  --  3.6 3.7  4.4  CL 107 110 110  --  112* 110 108  CO2 24 23 23   --  26 26 26   GLUCOSE 115* 106* 104*  --  119* 116* 118*  BUN 24* 20 17  --  17 23* 22*  CREATININE 0.60 0.57 0.53  --  0.53 0.52 0.50  CALCIUM 8.7* 8.8* 8.5*  --  8.7* 8.7* 8.6*  MG 1.8  --   --  1.7  --   --   --   PHOS 2.1* 1.7* 1.7* 3.4  --   --   --    GFR: Estimated Creatinine Clearance: 44.6 mL/min (by C-G formula based on Cr of 0.5). Liver Function Tests:  Recent Labs Lab 05/14/16 2109 05/15/16 0350  AST 17 19  ALT 15 17  ALKPHOS 53 47  BILITOT 0.6 0.5  PROT 5.8* 5.7*  ALBUMIN 3.2* 3.1*   No results for input(s): LIPASE, AMYLASE in the last 168 hours. No results for input(s): AMMONIA in the last 168 hours. Coagulation Profile: No results for input(s): INR, PROTIME in the last 168 hours. Cardiac Enzymes: No results for input(s): CKTOTAL, CKMB, CKMBINDEX, TROPONINI in the last 168 hours. BNP (last 3 results) No results for input(s): PROBNP in the last 8760 hours. HbA1C: No results for input(s): HGBA1C in the last 72 hours. CBG: No results for input(s): GLUCAP in the last 168 hours. Lipid Profile: No results for input(s): CHOL, HDL, LDLCALC, TRIG, CHOLHDL, LDLDIRECT in the last 72 hours. Thyroid Function Tests: No results for input(s): TSH, T4TOTAL, FREET4, T3FREE, THYROIDAB in the last 72 hours. Anemia Panel: No results for input(s): VITAMINB12, FOLATE, FERRITIN, TIBC, IRON, RETICCTPCT in the last 72 hours. Sepsis Labs:  Recent Labs Lab 05/14/16 2132 05/15/16 0049 05/15/16 0350  PROCALCITON  --   --  0.99  LATICACIDVEN 1.00 1.13  --     Recent Results (from the past 240 hour(s))  Blood Culture (routine x 2)     Status: Abnormal   Collection Time: 05/14/16  9:09 PM  Result Value Ref Range Status   Specimen Description BLOOD LEFT ANTECUBITAL  Final   Special Requests BOTTLES DRAWN AEROBIC AND ANAEROBIC 5ML  Final   Culture  Setup Time   Final    GRAM NEGATIVE RODS ANAEROBIC BOTTLE ONLY CRITICAL  RESULT CALLED TO, READ BACK BY AND VERIFIED WITH: T GREEN 05/15/16 @ 40 M VESTAL Performed at [place] (A)  Final   Report Status 05/17/2016 FINAL  Final   Organism ID, Bacteria ESCHERICHIA COLI  Final      Susceptibility   Escherichia coli - MIC*    AMPICILLIN >=32 RESISTANT Resistant     CEFAZOLIN <=4 SENSITIVE Sensitive     CEFEPIME <=1 SENSITIVE Sensitive     CEFTAZIDIME <=1 SENSITIVE Sensitive     CEFTRIAXONE <=1 SENSITIVE Sensitive  CIPROFLOXACIN >=4 RESISTANT Resistant     GENTAMICIN <=1 SENSITIVE Sensitive     IMIPENEM <=0.25 SENSITIVE Sensitive     TRIMETH/SULFA <=20 SENSITIVE Sensitive     AMPICILLIN/SULBACTAM 8 SENSITIVE Sensitive     PIP/TAZO <=4 SENSITIVE Sensitive     * ESCHERICHIA COLI  Blood Culture ID Panel (Reflexed)     Status: Abnormal   Collection Time: 05/14/16  9:09 PM  Result Value Ref Range Status   Enterococcus species NOT DETECTED NOT DETECTED Final   Vancomycin resistance NOT DETECTED NOT DETECTED Final   Listeria monocytogenes NOT DETECTED NOT DETECTED Final   Staphylococcus species NOT DETECTED NOT DETECTED Final   Staphylococcus aureus NOT DETECTED NOT DETECTED Final   Methicillin resistance NOT DETECTED NOT DETECTED Final   Streptococcus species NOT DETECTED NOT DETECTED Final   Streptococcus agalactiae NOT DETECTED NOT DETECTED Final   Streptococcus pneumoniae NOT DETECTED NOT DETECTED Final   Streptococcus pyogenes NOT DETECTED NOT DETECTED Final   Acinetobacter baumannii NOT DETECTED NOT DETECTED Final   Enterobacteriaceae species DETECTED (A) NOT DETECTED Final    Comment: CRITICAL RESULT CALLED TO, READ BACK BY AND VERIFIED WITH: T GREEN 05/15/16 @ 1247 M VESTAL    Enterobacter cloacae complex NOT DETECTED NOT DETECTED Final   Escherichia coli DETECTED (A) NOT DETECTED Final    Comment: CRITICAL RESULT CALLED TO, READ BACK BY AND VERIFIED WITH: T GREEN 05/15/16 @ 13 M VESTAL    Klebsiella  oxytoca NOT DETECTED NOT DETECTED Final   Klebsiella pneumoniae NOT DETECTED NOT DETECTED Final   Proteus species NOT DETECTED NOT DETECTED Final   Serratia marcescens NOT DETECTED NOT DETECTED Final   Carbapenem resistance NOT DETECTED NOT DETECTED Final   Haemophilus influenzae NOT DETECTED NOT DETECTED Final   Neisseria meningitidis NOT DETECTED NOT DETECTED Final   Pseudomonas aeruginosa NOT DETECTED NOT DETECTED Final   Candida albicans NOT DETECTED NOT DETECTED Final   Candida glabrata NOT DETECTED NOT DETECTED Final   Candida krusei NOT DETECTED NOT DETECTED Final   Candida parapsilosis NOT DETECTED NOT DETECTED Final   Candida tropicalis NOT DETECTED NOT DETECTED Final    Comment: Performed at [facility]  Blood Culture (routine x 2)     Status: Abnormal   Collection Time: 05/14/16  9:18 PM  Result Value Ref Range Status   Specimen Description BLOOD RIGHT ANTECUBITAL  Final   Special Requests BOTTLES DRAWN AEROBIC AND ANAEROBIC 5ML  Final   Culture  Setup Time   Final    GRAM NEGATIVE RODS ANAEROBIC BOTTLE ONLY CRITICAL RESULT CALLED TO, READ BACK BY AND VERIFIED WITH: T GREEN 05/15/16 @ 92 M VESTAL    Culture (A)  Final    ESCHERICHIA COLI SUSCEPTIBILITIES PERFORMED ON PREVIOUS CULTURE WITHIN THE LAST 5 DAYS. Performed at [facility]    Report Status 05/17/2016 FINAL  Final  Urine culture     Status: Abnormal   Collection Time: 05/14/16 11:14 PM  Result Value Ref Range Status   Specimen Description URINE, CLEAN CATCH  Final   Special Requests NONE  Final   Culture MULTIPLE SPECIES PRESENT, SUGGEST RECOLLECTION (A)  Final   Report Status 05/16/2016 FINAL  Final  Urine culture     Status: Abnormal   Collection Time: 05/16/16 10:52 AM  Result Value Ref Range Status   Specimen Description URINE, CLEAN CATCH  Final   Special Requests NONE  Final   Culture MULTIPLE SPECIES PRESENT, SUGGEST RECOLLECTION (A)  Final  Report Status 05/17/2016 FINAL  Final    Culture, blood (routine x 2)     Status: None (Preliminary result)   Collection Time: 05/18/16 10:58 AM  Result Value Ref Range Status   Specimen Description BLOOD LEFT ANTECUBITAL  Final   Special Requests BOTTLES DRAWN AEROBIC ONLY 5 CC  Final   Culture   Final    NO GROWTH 3 DAYS Performed at [facility]    Report Status PENDING  Incomplete  Culture, blood (routine x 2)     Status: None (Preliminary result)   Collection Time: 05/18/16 10:58 AM  Result Value Ref Range Status   Specimen Description BLOOD RIGHT ANTECUBITAL  Final   Special Requests BOTTLES DRAWN AEROBIC AND ANAEROBIC 10 CC EA  Final   Culture   Final    NO GROWTH 3 DAYS Performed at [facility]    Report Status PENDING  Incomplete         Radiology Studies: No results found.      Scheduled Meds: . acetaminophen  650 mg Oral q12n4p  . atenolol  25 mg Oral BID  . ceFEPime (MAXIPIME) IV  1 g Intravenous Q12H  . divalproex  250 mg Oral BID  . donepezil  10 mg Oral Daily  . enoxaparin (LOVENOX) injection  40 mg Subcutaneous Q24H  . feeding supplement (ENSURE ENLIVE)  237 mL Oral BID BM  . furosemide  20 mg Oral Daily  . loratadine  10 mg Oral Daily  . memantine  28 mg Oral Daily  . nortriptyline  25 mg Oral QHS  . pantoprazole  20 mg Oral Daily  . polyethylene glycol  17 g Oral Daily  . temazepam  7.5 mg Oral QHS  . [START ON 05/22/2016] vancomycin  750 mg Intravenous Q12H   Continuous Infusions:     LOS: 6 days    Time spent: 35 minutes.     Elmarie Shiley, MD [facility] Pager [phone]  If 7PM-7AM, please contact night-coverage [email or web address] Password [facility] 05/21/2016, 6:07 PM

## 2016-05-22 ENCOUNTER — Inpatient Hospital Stay (HOSPITAL_COMMUNITY): Payer: Medicare Other

## 2016-05-22 DIAGNOSIS — R609 Edema, unspecified: Secondary | ICD-10-CM

## 2016-05-22 LAB — BASIC METABOLIC PANEL
ANION GAP: 7 (ref 5–15)
BUN: 18 mg/dL (ref 6–20)
CALCIUM: 8.9 mg/dL (ref 8.9–10.3)
CO2: 27 mmol/L (ref 22–32)
Chloride: 105 mmol/L (ref 101–111)
Creatinine, Ser: 0.46 mg/dL (ref 0.44–1.00)
GLUCOSE: 168 mg/dL — AB (ref 65–99)
POTASSIUM: 4 mmol/L (ref 3.5–5.1)
Sodium: 139 mmol/L (ref 135–145)

## 2016-05-22 LAB — MAGNESIUM: MAGNESIUM: 1.9 mg/dL (ref 1.7–2.4)

## 2016-05-22 MED ORDER — CEFUROXIME AXETIL 500 MG PO TABS
500.0000 mg | ORAL_TABLET | Freq: Two times a day (BID) | ORAL | Status: DC
  Filled 2016-05-22 (×2): qty 1

## 2016-05-22 MED ORDER — BISACODYL 10 MG RE SUPP
10.0000 mg | Freq: Once | RECTAL | Status: AC
  Administered 2016-05-22: 10 mg via RECTAL
  Filled 2016-05-22: qty 1

## 2016-05-22 MED ORDER — FUROSEMIDE 20 MG PO TABS
20.0000 mg | ORAL_TABLET | Freq: Two times a day (BID) | ORAL | Status: DC
  Administered 2016-05-22 – 2016-05-23 (×3): 20 mg via ORAL
  Filled 2016-05-22 (×3): qty 1

## 2016-05-22 MED ORDER — VANCOMYCIN HCL IN DEXTROSE 750-5 MG/150ML-% IV SOLN
750.0000 mg | Freq: Two times a day (BID) | INTRAVENOUS | Status: DC
  Administered 2016-05-22 – 2016-05-23 (×2): 750 mg via INTRAVENOUS
  Filled 2016-05-22 (×3): qty 150

## 2016-05-22 MED ORDER — DEXTROSE 5 % IV SOLN
1.0000 g | Freq: Two times a day (BID) | INTRAVENOUS | Status: DC
  Administered 2016-05-22 – 2016-05-23 (×2): 1 g via INTRAVENOUS
  Filled 2016-05-22 (×3): qty 1

## 2016-05-22 NOTE — Progress Notes (Signed)
PROGRESS NOTE    [name]  [id number] DOB: 06/12/1926 DOA: 05/14/2016 PCP: Marton Redwood, MD   Brief Narrative: [name] is a 82 y.o. female with medical history significant of falls Alzheimer's disease, bladder cancer status post urostomy, and recurrent UTI.  Patient admitted with fever, lethargic, poor oral intake, sepsis. She was admitted, started on IV fluids and IV antibiotics.   Patient admitted with sepsis, Escherichia coli bacteremia, UTI. Subsequently patient developed pneumonia, aspiration versus healthcare associated pneumonia. She has been receiving IV cefepime and vancomycin. She was also admitted with acute encephalopathy, delirium. Patient's mental status fluctuates. Her mental status get worse when she spikes fever. Today patient was noted to be again more  Lethargic. If patient remains afebrile we will get MRI to rule out other possibilities.   Assessment & Plan:   Active Problems:   NEOPLASM, MALIGNANT, BLADDER, HX OF   Urinary tract infection   Hypertension   CAD (coronary artery disease)   Alzheimer's dementia   UTI (lower urinary tract infection)   Acute encephalopathy   Sepsis ([place])   Altered mental state  1-Sepsis; E coli Bacteremia:  presents with fevers, lethargic, UA with too numerous to count WBC.  E coli sensitive to ancef, cefepime . Could use Ceftin at discharge. Pharmacist review prior records and patient has tolerated cephalosporin in the past.  Urine culture growing multiple bacteria.  Blood culture ID p: gram negative rods. E coli, enterobacteriaceae. E coli sensitive to ancef.  Repeat blood culture 5-26 no growth  She will needs 2 weeks of treatment counting from 5-26.  Wont be able to tolerates oral antibiotics today, will continue with vancomycin and cefepime.   2-Acute encephalopathy; lethargic on admission. Likely related to infection.  Treating for UTI/  Patient appears to have delirium. Her mentation fluctuates on and  off. Also fevers and infection process playing a role.  Will check Tempeture, if afebrile will ordered MRI.   3-PNA/ Health care associated Vs aspiration Continue with  cefepime and vancomycin.  Day 5 antibiotic.s  Underwent speech evaluation, On Dysphagia diet   4-UTI; ancef change to cefepime to cover for PNA>   5-Tachycardia;  Sinus with PVC.  Continue with  Atenolol. Had SVT this am, check Mg and K level.  Monitor on telemetry   6-Alzheimer's dementia - stable anticipate some degree of sundowning while hospitalized   7-Hypophosphatemia resolved. Repeat labs in the morning  8-HTN; continue with lasix, Continue with atenolol. IV PRN Hydralazine.  9-Anemia; acute illness and chronic. Follow trend. Repeat labs in the morning 10-Right arm swelling; had IV on that site. But will rule out DVT.   DVT prophylaxis: Lovenox./  Code Status: DNR Family Communication: care giver at bedside. Husband at bedside. Introduce idea about consulting palliative care, he will think about it.  Disposition Plan: remain inpatient, more sleepy today per caregiver.. Further work up    Consultants:   none  Procedures:  none  Antimicrobials:   Vancomycin 5-26  Cefepime 5-26   Subjective: Had better day yesterday. More lethargic this am. Per husband and caregiver patient has not say any word today.  She seem sleepy, feels warm, after while wake some and say few words, went back to sleep.   Objective: Filed Vitals:   05/21/16 2205 05/22/16 0501 05/22/16 0517 05/22/16 0610  BP: 128/58 127/73    Pulse: 91 106 98   Temp: 97.6 F (36.4 C) 98.4 F (36.9 C)    TempSrc: Oral Oral  Resp: 18 18 18    Height:      Weight:      SpO2: 92% 87% 91% 94%    Intake/Output Summary (Last 24 hours) at 05/22/16 1147 Last data filed at 05/22/16 [id number]  Gross per 24 hour  Intake    350 ml  Output   1175 ml  Net   -825 ml   Filed Weights   05/15/16 0245  Weight: 58.786 kg (129 lb 9.6 oz)     Examination:  General exam: Appears calm and comfortable , lethargic this am  Respiratory system: Clear to auscultation. Respiratory effort normal. Cardiovascular system: S1 & S2 heard, RRR. No JVD, murmurs, rubs, gallops or clicks. No pedal edema. Gastrointestinal system: Abdomen is nondistended, soft and nontender. No organomegaly or masses felt. Normal bowel sounds heard. Extremities: no edema Skin: No rashes, lesions or ulcers     Data Reviewed: I have personally reviewed following labs and imaging studies  CBC:  Recent Labs Lab 05/17/16 0331 05/19/16 0531 05/20/16 0523 05/21/16 0438  WBC 6.9 7.0 6.8 7.4  HGB 10.3* 8.8* 8.2* 8.6*  HCT 31.1* 26.0* 25.0* 26.0*  MCV 96.6 95.2 96.2 96.7  PLT 96* 118* 136* [id number]   Basic Metabolic Panel:  Recent Labs Lab 05/16/16 0954 05/17/16 0331 05/19/16 0527 05/19/16 0531 05/20/16 0523 05/21/16 0438  NA 138 138  --  143 142 141  K 3.9 3.4*  --  3.6 3.7 4.4  CL 110 110  --  112* 110 108  CO2 23 23  --  26 26 26   GLUCOSE 106* 104*  --  119* 116* 118*  BUN 20 17  --  17 23* 22*  CREATININE 0.57 0.53  --  0.53 0.52 0.50  CALCIUM 8.8* 8.5*  --  8.7* 8.7* 8.6*  MG  --   --  1.7  --   --   --   PHOS 1.7* 1.7* 3.4  --   --   --    GFR: Estimated Creatinine Clearance: 44.6 mL/min (by C-G formula based on Cr of 0.5). Liver Function Tests: No results for input(s): AST, ALT, ALKPHOS, BILITOT, PROT, ALBUMIN in the last 168 hours. No results for input(s): LIPASE, AMYLASE in the last 168 hours. No results for input(s): AMMONIA in the last 168 hours. Coagulation Profile: No results for input(s): INR, PROTIME in the last 168 hours. Cardiac Enzymes: No results for input(s): CKTOTAL, CKMB, CKMBINDEX, TROPONINI in the last 168 hours. BNP (last 3 results) No results for input(s): PROBNP in the last 8760 hours. HbA1C: No results for input(s): HGBA1C in the last 72 hours. CBG: No results for input(s): GLUCAP in the last 168  hours. Lipid Profile: No results for input(s): CHOL, HDL, LDLCALC, TRIG, CHOLHDL, LDLDIRECT in the last 72 hours. Thyroid Function Tests: No results for input(s): TSH, T4TOTAL, FREET4, T3FREE, THYROIDAB in the last 72 hours. Anemia Panel: No results for input(s): VITAMINB12, FOLATE, FERRITIN, TIBC, IRON, RETICCTPCT in the last 72 hours. Sepsis Labs: No results for input(s): PROCALCITON, LATICACIDVEN in the last 168 hours.  Recent Results (from the past 240 hour(s))  Blood Culture (routine x 2)     Status: Abnormal   Collection Time: 05/14/16  9:09 PM  Result Value Ref Range Status   Specimen Description BLOOD LEFT ANTECUBITAL  Final   Special Requests BOTTLES DRAWN AEROBIC AND ANAEROBIC 5ML  Final   Culture  Setup Time   Final    GRAM NEGATIVE RODS ANAEROBIC BOTTLE ONLY CRITICAL RESULT CALLED  TO, READ BACK BY AND VERIFIED WITH: T GREEN 05/15/16 @ 64 M VESTAL Performed at [place] (A)  Final   Report Status 05/17/2016 FINAL  Final   Organism ID, Bacteria ESCHERICHIA COLI  Final      Susceptibility   Escherichia coli - MIC*    AMPICILLIN >=32 RESISTANT Resistant     CEFAZOLIN <=4 SENSITIVE Sensitive     CEFEPIME <=1 SENSITIVE Sensitive     CEFTAZIDIME <=1 SENSITIVE Sensitive     CEFTRIAXONE <=1 SENSITIVE Sensitive     CIPROFLOXACIN >=4 RESISTANT Resistant     GENTAMICIN <=1 SENSITIVE Sensitive     IMIPENEM <=0.25 SENSITIVE Sensitive     TRIMETH/SULFA <=20 SENSITIVE Sensitive     AMPICILLIN/SULBACTAM 8 SENSITIVE Sensitive     PIP/TAZO <=4 SENSITIVE Sensitive     * ESCHERICHIA COLI  Blood Culture ID Panel (Reflexed)     Status: Abnormal   Collection Time: 05/14/16  9:09 PM  Result Value Ref Range Status   Enterococcus species NOT DETECTED NOT DETECTED Final   Vancomycin resistance NOT DETECTED NOT DETECTED Final   Listeria monocytogenes NOT DETECTED NOT DETECTED Final   Staphylococcus species NOT DETECTED NOT DETECTED Final    Staphylococcus aureus NOT DETECTED NOT DETECTED Final   Methicillin resistance NOT DETECTED NOT DETECTED Final   Streptococcus species NOT DETECTED NOT DETECTED Final   Streptococcus agalactiae NOT DETECTED NOT DETECTED Final   Streptococcus pneumoniae NOT DETECTED NOT DETECTED Final   Streptococcus pyogenes NOT DETECTED NOT DETECTED Final   Acinetobacter baumannii NOT DETECTED NOT DETECTED Final   Enterobacteriaceae species DETECTED (A) NOT DETECTED Final    Comment: CRITICAL RESULT CALLED TO, READ BACK BY AND VERIFIED WITH: T GREEN 05/15/16 @ 1247 M VESTAL    Enterobacter cloacae complex NOT DETECTED NOT DETECTED Final   Escherichia coli DETECTED (A) NOT DETECTED Final    Comment: CRITICAL RESULT CALLED TO, READ BACK BY AND VERIFIED WITH: T GREEN 05/15/16 @ 8 M VESTAL    Klebsiella oxytoca NOT DETECTED NOT DETECTED Final   Klebsiella pneumoniae NOT DETECTED NOT DETECTED Final   Proteus species NOT DETECTED NOT DETECTED Final   Serratia marcescens NOT DETECTED NOT DETECTED Final   Carbapenem resistance NOT DETECTED NOT DETECTED Final   Haemophilus influenzae NOT DETECTED NOT DETECTED Final   Neisseria meningitidis NOT DETECTED NOT DETECTED Final   Pseudomonas aeruginosa NOT DETECTED NOT DETECTED Final   Candida albicans NOT DETECTED NOT DETECTED Final   Candida glabrata NOT DETECTED NOT DETECTED Final   Candida krusei NOT DETECTED NOT DETECTED Final   Candida parapsilosis NOT DETECTED NOT DETECTED Final   Candida tropicalis NOT DETECTED NOT DETECTED Final    Comment: Performed at [facility]  Blood Culture (routine x 2)     Status: Abnormal   Collection Time: 05/14/16  9:18 PM  Result Value Ref Range Status   Specimen Description BLOOD RIGHT ANTECUBITAL  Final   Special Requests BOTTLES DRAWN AEROBIC AND ANAEROBIC 5ML  Final   Culture  Setup Time   Final    GRAM NEGATIVE RODS ANAEROBIC BOTTLE ONLY CRITICAL RESULT CALLED TO, READ BACK BY AND VERIFIED WITH: T GREEN  05/15/16 @ 68 M VESTAL    Culture (A)  Final    ESCHERICHIA COLI SUSCEPTIBILITIES PERFORMED ON PREVIOUS CULTURE WITHIN THE LAST 5 DAYS. Performed at [facility]    Report Status 05/17/2016 FINAL  Final  Urine culture  Status: Abnormal   Collection Time: 05/14/16 11:14 PM  Result Value Ref Range Status   Specimen Description URINE, CLEAN CATCH  Final   Special Requests NONE  Final   Culture MULTIPLE SPECIES PRESENT, SUGGEST RECOLLECTION (A)  Final   Report Status 05/16/2016 FINAL  Final  Urine culture     Status: Abnormal   Collection Time: 05/16/16 10:52 AM  Result Value Ref Range Status   Specimen Description URINE, CLEAN CATCH  Final   Special Requests NONE  Final   Culture MULTIPLE SPECIES PRESENT, SUGGEST RECOLLECTION (A)  Final   Report Status 05/17/2016 FINAL  Final  Culture, blood (routine x 2)     Status: None (Preliminary result)   Collection Time: 05/18/16 10:58 AM  Result Value Ref Range Status   Specimen Description BLOOD LEFT ANTECUBITAL  Final   Special Requests BOTTLES DRAWN AEROBIC ONLY 5 CC  Final   Culture   Final    NO GROWTH 4 DAYS Performed at [facility]    Report Status PENDING  Incomplete  Culture, blood (routine x 2)     Status: None (Preliminary result)   Collection Time: 05/18/16 10:58 AM  Result Value Ref Range Status   Specimen Description BLOOD RIGHT ANTECUBITAL  Final   Special Requests BOTTLES DRAWN AEROBIC AND ANAEROBIC 10 CC EA  Final   Culture   Final    NO GROWTH 4 DAYS Performed at [facility]    Report Status PENDING  Incomplete         Radiology Studies: No results found.      Scheduled Meds: . atenolol  25 mg Oral BID  . bisacodyl  10 mg Rectal Once  . cefUROXime  500 mg Oral BID WC  . divalproex  250 mg Oral BID  . donepezil  10 mg Oral Daily  . feeding supplement (ENSURE ENLIVE)  237 mL Oral BID BM  . furosemide  20 mg Oral BID  . loratadine  10 mg Oral Daily  . memantine  28 mg Oral  Daily  . nortriptyline  25 mg Oral QHS  . pantoprazole  20 mg Oral Daily  . polyethylene glycol  17 g Oral Daily  . temazepam  7.5 mg Oral QHS   Continuous Infusions:     LOS: 7 days    Time spent: 35 minutes.     Elmarie Shiley, MD [facility] Pager [phone]  If 7PM-7AM, please contact night-coverage [email or web address] Password [email or web address] 05/22/2016, 11:47 AM

## 2016-05-22 NOTE — Progress Notes (Signed)
Morning vitals showed that pt. O2 was 87. Put pt on 2 L O2, repositioned patient, and sats went up to 92. Weaned O2 to 1 L and O2 sat was at 94. Will continue to monitor and will take pt. Off of O2 if sats stay up.

## 2016-05-22 NOTE — Progress Notes (Signed)
Nutrition Follow-up  DOCUMENTATION CODES:   Not applicable  INTERVENTION:  -Continue Ensure Enlive po BID, each supplement provides 350 kcal and 20 grams of protein -Encourage PO intake  NUTRITION DIAGNOSIS:   Inadequate oral intake related to lethargy/confusion as evidenced by per patient/family report. -Improving with PO intake  GOAL:   Patient will meet greater than or equal to 90% of their needs -Progressing  MONITOR:   PO intake, Supplement acceptance, Weight trends, Labs, Skin, I & O's  ASSESSMENT:   Pt with medical history significant of falls Alzheimer's disease, bladder cancer status post urostomy, and recurrent UTI  Ms. Schweder is improving. Currently on NDD3 - Had Eggs and Grits this morning. Documented PO 50-75% for last couple meals. Refused a few ensures early on but is consuming most of them now. UTI/Sepsis has improved -> Per case management and MD, she is to d/c today.  Labs and Medications reviewed.  Diet Order:  DIET DYS 3 Room service appropriate?: Yes; Fluid consistency:: Thin  Skin:  Reviewed, no issues  Last BM:  5/23  Height:   Ht Readings from Last 1 Encounters:  05/15/16 5\' 5"  (1.651 m)    Weight:   Wt Readings from Last 1 Encounters:  05/15/16 129 lb 9.6 oz (58.786 kg)    Ideal Body Weight:  56.8 kg  BMI:  Body mass index is 21.57 kg/(m^2).  Estimated Nutritional Needs:   Kcal:  1200-1400  Protein:  65-75  Fluid:  1.2-1.4 L  EDUCATION NEEDS:   No education needs identified at this time  Satira Anis. Eliani Leclere, MS, RD LDN Inpatient Clinical Dietitian Pager [phone]

## 2016-05-22 NOTE — Progress Notes (Addendum)
VASCULAR LAB PRELIMINARY  PRELIMINARY  PRELIMINARY  PRELIMINARY  Right upper extremity venous duplex completed.    Preliminary report:  Right :  No evidence of DVT or superficial thrombosis.    [name], [place], RVS 05/22/2016, 2:20 PM

## 2016-05-22 NOTE — Care Management Note (Signed)
Case Management Note  Patient Details  Name: KARLENE HOSELTON MRN: [id number] Date of Birth: 06-Jul-1926  Subjective/Objective:  Spoke to spiuse again ablut HHC-agreed to [facility] for HHRN/PT/OT/aide/sw-rep Santiago Glad aware of [place] orders, & d/c today. [facility] dme rep Jermaine aware of hoyer lift for home ,& orders. PTAR form, DNR forms completed & in chart-Nsg to manage calling for transport.                 Action/Plan:d/c home w/HHC/dme   Expected Discharge Date:                  Expected Discharge Plan:  [place]  In-House Referral:  NA  Discharge planning Services  CM Consult  Post Acute Care Choice:  Resumption of Svcs/PTA Provider, Durable Medical Equipment (Active w/private cargivers 24hrs a day;has w/c, lift chair,3n1,hospital bed.-[facility]) Choice offered to:  Spouse  DME Arranged:   (hoyer lift) DME Agency:  [place]:  RN, PT, OT, Nurse's Aide, Social Work [employer] Agency:  [place]  Status of Service:  Completed, signed off  Medicare Important Message Given:  Yes Date Medicare IM Given:    Medicare IM give by:    Date Additional Medicare IM Given:    Additional Medicare Important Message give by:     If discussed at [place] of Stay Meetings, dates discussed:    Additional Comments:  Dessa Phi, RN 05/22/2016, 11:28 AM

## 2016-05-22 NOTE — Care Management Note (Signed)
Case Management Note  Patient Details  Name: BINTOU HORSTMEYER MRN: [id number] Date of Birth: 15-May-1926  Subjective/Objective: Spoke to spouse about d/c-[place]-wants home, declines SNF-has 24hrs a day caregivers, has w/c,lift chair,hospital bed.No 02 needed. He wants a [employer] fast streamer. MD notified of hoyer lift order, & f61f. Will transport home by PTAR-confirmed address. [facility] dme Jermaine provided w/contact tel# [phone]-for delivery arrangements of hoyer lift-this dme item does have to be in home prior patient arriving to home @ d/c. Once confirmed d/c date, then CM will provide PTAR info in shadow chart. DNR out of facility form in shadow chart.                   Action/Plan:d/c home by PTAR.   Expected Discharge Date:                  Expected Discharge Plan:  Home/Self Care  In-House Referral:  NA  Discharge planning Services  CM Consult  Post Acute Care Choice:  Resumption of Svcs/PTA Provider, Durable Medical Equipment (Active w/private cargivers 24hrs a day;has w/c, lift chair,3n1,hospital bed.-[facility]) Choice offered to:  Spouse  DME Arranged:  N/A DME Agency:  NA  HH Arranged:    [place] Agency:     Status of Service:  Completed, signed off  Medicare Important Message Given:  Yes Date Medicare IM Given:    Medicare IM give by:    Date Additional Medicare IM Given:    Additional Medicare Important Message give by:     If discussed at [place] of Stay Meetings, dates discussed:    Additional Comments:  Dessa Phi, RN 05/22/2016, 10:48 AM

## 2016-05-22 NOTE — Care Management Important Message (Signed)
Important Message  Patient Details  Name: [name] MRN: [id number] Date of Birth: 10/25/1926   Medicare Important Message Given:  Yes    MahabirJuliann Pulse, RN 05/22/2016, 10:14 AM

## 2016-05-22 NOTE — Progress Notes (Signed)
Pharmacy Antibiotic Note  [name] is a 82 y.o. female admitted on 05/14/2016 with sepsis/ E coli bacteremia.  Today a chest x-ray showed possible pneumonia .  Pharmacy was consulted for Vancomycin and cefepime dosing. MD changed to Ceftin this morning, however after seeing patient decided to resume Vanc & Cefepime  Plan: - Continue Vancomycin 750mg  q12 -conservative dosing in increase due to age of 82 yo - Continue cefepime 1gm IV q12h (this will cover the ecoli from previous blood cultures)  - Follow cultures, renal function, length of therapy - Consider d/c Vancomycin if clinically appropriate   Height: 5\' 5"  (165.1 cm) Weight: 129 lb 9.6 oz (58.786 kg) IBW/kg (Calculated) : 57  Temp (24hrs), Avg:98.6 F (37 C), Min:97.6 F (36.4 C), Max:99.6 F (37.6 C)   Recent Labs Lab 05/17/16 0331 05/19/16 0531 05/20/16 0523 05/21/16 0438 05/21/16 1320 05/22/16 1107  WBC 6.9 7.0 6.8 7.4  --   --   CREATININE 0.53 0.53 0.52 0.50  --  0.46  VANCOTROUGH  --   --   --   --  7*  --     Estimated Creatinine Clearance: 44.6 mL/min (by C-G formula based on Cr of 0.46).    Allergies  Allergen Reactions  . Penicillins Anaphylaxis    Severe edema, severe difficulty breathing, anaphylactic reaction. Per older [employer], television/film/video) records - patient has tolerated multiple cephalosporins (ceftriaxone, cefepime, cefuroxime)  . Bethanechol Chloride     Chest pains. Coronary spasms  . Celecoxib     Celebrex.  Severe itching.   . Citalopram Hydrobromide     Hyper, nervousness  . Clindamycin     Itching, coughing  . Codeine     tachycardia  . Desipramine     Severe tachycardia  . Epinephrine     Severe shortness of breath.   . Erythromycin     Burning, itching  . Floxin [Ocuflox]     itching  . Glucose     Sugar.  Weakness,faintness  . Hydromorphone Hcl     Dilaudid.  Comatose-Anaphylactic reaction.   . Ibuprofen     Tachycardia, weakness  . Iodine     Hives, edema, tachycardia  .  Meperidine Hcl     Demerol.  Tachycardia, extreme nervousness.   . Morphine     Tachycardia, hallucinations, extreme nervousness.  . Paroxetine Hcl     Extreme nervousness  . Procaine Hcl     Novocain. Severe shortness of breath.  . Propranolol Hcl     Inderal.  Extreme low blood pressure.   . Red Dye     Severe itching  . Restoril     Temazepam.  Extreme nervousness, sleeplessness  . Scopolamine     Irrational, loss of memory  . Sulfonamide Derivatives     Hives, edema  . Tetracycline     Severe hives, edema  . Trazodone And Nefazodone     Very hyper  . Valium     itching    Antimicrobials this admission: 5/22 levaquin >> 5/23  5/23 azactam >> 5/25  5/25 cefazolin >> 5/26 5/26 vancomycin >> 5/26 cefepime >>  Dose adjustments this admission:   Microbiology results: 5/22 BCx: e. Coli (R to amp, cipro)  5/22 UCx: multiple species present suggest recollection 5/26 BCx : sent   Thank you for allowing pharmacy to be a part of this patient's care.  Netta Cedars, PharmD, BCPS Pager: [phone] 05/22/2016 12:38 PM

## 2016-05-22 NOTE — Care Management Important Message (Signed)
Important Message  Patient Details  Name: [name] MRN: [id number] Date of Birth: Apr 03, 1926   Medicare Important Message Given:  Yes    MahabirJuliann Pulse, RN 05/22/2016, 10:01 AM

## 2016-05-23 ENCOUNTER — Inpatient Hospital Stay (HOSPITAL_COMMUNITY): Payer: Medicare Other

## 2016-05-23 LAB — CBC
HCT: 26.2 % — ABNORMAL LOW (ref 36.0–46.0)
HEMOGLOBIN: 8.7 g/dL — AB (ref 12.0–15.0)
MCH: 31.5 pg (ref 26.0–34.0)
MCHC: 33.2 g/dL (ref 30.0–36.0)
MCV: 94.9 fL (ref 78.0–100.0)
Platelets: 251 10*3/uL (ref 150–400)
RBC: 2.76 MIL/uL — ABNORMAL LOW (ref 3.87–5.11)
RDW: 14.4 % (ref 11.5–15.5)
WBC: 9.9 10*3/uL (ref 4.0–10.5)

## 2016-05-23 LAB — BASIC METABOLIC PANEL
ANION GAP: 8 (ref 5–15)
BUN: 20 mg/dL (ref 6–20)
CO2: 28 mmol/L (ref 22–32)
Calcium: 9.2 mg/dL (ref 8.9–10.3)
Chloride: 105 mmol/L (ref 101–111)
Creatinine, Ser: 0.45 mg/dL (ref 0.44–1.00)
Glucose, Bld: 117 mg/dL — ABNORMAL HIGH (ref 65–99)
POTASSIUM: 4.4 mmol/L (ref 3.5–5.1)
Sodium: 141 mmol/L (ref 135–145)

## 2016-05-23 LAB — CULTURE, BLOOD (ROUTINE X 2)
CULTURE: NO GROWTH
Culture: NO GROWTH

## 2016-05-23 LAB — PHOSPHORUS: PHOSPHORUS: 2.9 mg/dL (ref 2.5–4.6)

## 2016-05-23 MED ORDER — LORAZEPAM 2 MG/ML IJ SOLN: 1.0000 mg | Freq: Once | INTRAMUSCULAR | Status: DC

## 2016-05-23 MED ORDER — ENSURE ENLIVE PO LIQD: 237.0000 mL | Freq: Two times a day (BID) | ORAL | Status: AC

## 2016-05-23 MED ORDER — CEFPODOXIME PROXETIL 200 MG PO TABS: 200.0000 mg | ORAL_TABLET | Freq: Two times a day (BID) | ORAL | Status: AC

## 2016-05-23 NOTE — Care Management Note (Signed)
Case Management Note  Patient Details  Name: [name] MRN: [id number] Date of Birth: 07-Oct-1926  Subjective/Objective:   [facility] dme rep-Jermaine, & [facility]HC rep Santiago Glad aware of d/c & HHC/HH DME orders-they have contacted spouse.Nsg aware to contact PTAR for transport-forms in shadow chart No further d/c needs                 Action/Plan:d/c home w/HHC   Expected Discharge Date:                  Expected Discharge Plan:  [place]  In-House Referral:  NA  Discharge planning Services  CM Consult  Post Acute Care Choice:  Resumption of Svcs/PTA Provider, Durable Medical Equipment (Active w/private cargivers 24hrs a day;has w/c, lift chair,3n1,hospital bed.-[facility]) Choice offered to:  Spouse  DME Arranged:   (hoyer lift) DME Agency:  [place]:  RN, PT, OT, Nurse's Aide, Social Work [employer] Agency:  [place]  Status of Service:  Completed, signed off  Medicare Important Message Given:  Yes Date Medicare IM Given:    Medicare IM give by:    Date Additional Medicare IM Given:    Additional Medicare Important Message give by:     If discussed at [place] of Stay Meetings, dates discussed:    Additional Comments:  Dessa Phi, RN 05/23/2016, 11:16 AM

## 2016-05-23 NOTE — Discharge Instructions (Signed)
Follow with Primary MD Marton Redwood, MD in 7 days   Get CBC, CMP, 2 view Chest X ray checked  by Primary MD next visit.    Activity: As tolerated with Full fall precautions use walker/cane & assistance as needed   Disposition Home     Diet:  Dysphagia 3, with feeding assistance and aspiration precautions.  For Heart failure patients - Check your Weight same time everyday, if you gain over 2 pounds, or you develop in leg swelling, experience more shortness of breath or chest pain, call your Primary MD immediately. Follow Cardiac Low Salt Diet and 1.5 lit/day fluid restriction.   On your next visit with your primary care physician please Get Medicines reviewed and adjusted.   Please request your Prim.MD to go over all Hospital Tests and Procedure/Radiological results at the follow up, please get all Hospital records sent to your Prim MD by signing hospital release before you go home.   If you experience worsening of your admission symptoms, develop shortness of breath, life threatening emergency, suicidal or homicidal thoughts you must seek medical attention immediately by calling 911 or calling your MD immediately  if symptoms less severe.  You Must read complete instructions/literature along with all the possible adverse reactions/side effects for all the Medicines you take and that have been prescribed to you. Take any new Medicines after you have completely understood and accpet all the possible adverse reactions/side effects.   Do not drive, operate heavy machinery, perform activities at heights, swimming or participation in water activities or provide baby sitting services if your were admitted for syncope or siezures until you have seen by Primary MD or a Neurologist and advised to do so again.  Do not drive when taking Pain medications.    Do not take more than prescribed Pain, Sleep and Anxiety Medications  Special Instructions: If you have smoked or chewed Tobacco  in the  last 2 yrs please stop smoking, stop any regular Alcohol  and or any Recreational drug use.  Wear Seat belts while driving.   Please note  You were cared for by a hospitalist during your hospital stay. If you have any questions about your discharge medications or the care you received while you were in the hospital after you are discharged, you can call the unit and asked to speak with the hospitalist on call if the hospitalist that took care of you is not available. Once you are discharged, your primary care physician will handle any further medical issues. Please note that NO REFILLS for any discharge medications will be authorized once you are discharged, as it is imperative that you return to your primary care physician (or establish a relationship with a primary care physician if you do not have one) for your aftercare needs so that they can reassess your need for medications and monitor your lab values.

## 2016-05-23 NOTE — Care Management Note (Signed)
Case Management Note  Patient Details  Name: MAIARA GRANER MRN: [id number] Date of Birth: June 29, 1926  Subjective/Objective:[facility] rep Santiago Glad aware of updated [place] orders-HHRN/PT/ST/aide. [facility] dme rep managing hoyer lift to home. PTAR-Nsg to contact.                   Action/Plan:d/c home w/HHC   Expected Discharge Date:                  Expected Discharge Plan:  [place]  In-House Referral:  NA  Discharge planning Services  CM Consult  Post Acute Care Choice:  Resumption of Svcs/PTA Provider, Durable Medical Equipment (Active w/private cargivers 24hrs a day;has w/c, lift chair,3n1,hospital bed.-[facility]) Choice offered to:  Spouse  DME Arranged:   (hoyer lift) DME Agency:  [place]:  RN, PT, Nurse's Aide, Speech Therapy HH Agency:  [place]  Status of Service:  Completed, signed off  Medicare Important Message Given:  Yes Date Medicare IM Given:    Medicare IM give by:    Date Additional Medicare IM Given:    Additional Medicare Important Message give by:     If discussed at [place] of Stay Meetings, dates discussed:    Additional Comments:  Dessa Phi, RN 05/23/2016, 11:36 AM

## 2016-05-23 NOTE — Discharge Summary (Signed)
[name], is a 82 y.o. female  DOB [birth year]  MRN [id number].  Admission date:  05/14/2016  Admitting Physician  Toy Baker, MD  Discharge Date:  05/23/2016   Primary MD  Marton Redwood, MD  Recommendations for primary care physician for things to follow:   Check CBC, BMP in 3-4 days.   Admission Diagnosis  Somnolence [R40.0] Acute cystitis without hematuria [N30.00] Fever, unspecified fever cause [R50.9]   Discharge Diagnosis  Somnolence [R40.0] Acute cystitis without hematuria [N30.00] Fever, unspecified fever cause [R50.9]    Active Problems:   NEOPLASM, MALIGNANT, BLADDER, HX OF   Urinary tract infection   Hypertension   CAD (coronary artery disease)   Alzheimer's dementia   UTI (lower urinary tract infection)   Acute encephalopathy   Sepsis ([place])   Altered mental state      Past Medical History  Diagnosis Date  . Bladder cancer [facility])     s/p cystectomy/ureterostomy  . Hypertension   . S/P appendectomy   . GERD (gastroesophageal reflux disease)   . Hyperlipidemia   . Dementia   . SVT (supraventricular tachycardia) ([place])   . Vitamin D deficiency   . Carotid stenosis   . Osteopenia   . Syncope   . Anxiety   . Cervical disc disease   . Pyelonephritis   . LBBB (left bundle branch block)   . Ventricular tachycardia ([place])   . CAD (coronary artery disease)   . PVC (premature ventricular contraction)   . Headache(784.0)   . Cervical spondylosis   . Heartburn   . Hydronephrosis   . Hypoglycemia   . Rectocele   . Microscopic hematuria   . Heart murmur   . Jejunostomy tube fell out ([place])   . Fall at home Nov. 9, 2014 and Dec. 2015    Pt was picking up her dog and fell and at home    Past Surgical History  Procedure Laterality Date  . Total abdominal hysterectomy w/  bilateral salpingoophorectomy    . Hernia repair    . Cystectomy    . Ureterostomy    . Appendectom    . Cardiac catheterization    . Fracture surgery    . Abdominal hysterectomy       HPI  from the history and physical done on the day of admission:   [name] is a 82 y.o. female with medical history significant of falls Alzheimer's disease, bladder cancer status post urostomy, and recurrent UTI. Patient admitted with fever, lethargic, poor oral intake, sepsis. She was admitted, started on IV fluids and IV antibiotics.   Patient admitted with sepsis, Escherichia coli bacteremia, UTI. Subsequently patient developed pneumonia, aspiration versus healthcare associated pneumonia. She has been receiving IV cefepime and vancomycin. She was also admitted with acute encephalopathy, delirium. Patient's mental status fluctuates.   I took care of the patient on the day of discharge, day 8, on 05/23/2016   Hospital Course:     1.Escherichia coli bacteremia. Source most likely was urine. Has  urostomy, urine specimen was not of good quality however appeared to have UTI. Her children sensitivities noted, she has responded well to cephalosporin, has multiple drug allergies and will be placed on Vantin to complete total of 14 days. Clinically no sepsis today, afebrile, mentation back to baseline discussed with caregiver bedside who has known the patient for the last 1 year.  2. Toxic encephalopathy on top of advanced dementia. Patient bedbound at home, currently at baseline discussed with caregiver bedside as above, MRI brain nonacute.  3. Aspiration pneumonia. No shortness of breath, no oxygen demand or cough, this has clinically resolved, seen by speech, placed on dysphagia 3 diet, home speech ordered. Antibiotics as above.  4. Advanced enzyme was dementia with generalized deconditioning and weakness. Bedbound, supportive care, home medications continued, follow with PCP. Baseline DO NOT  RESUSCITATE.  5. Anemia of chronic disease. Stable.  6. Hypertension. Continue home dose but a blocker.     Follow UP  Follow-up Information    Follow up with [facility].   Why:  hoyer Landscape architect information:   [address] [place] [address] [phone]       Follow up with [facility].   Why:  HHRN/PT/OT/aide/sw   Contact information:   [address] [place] [address] [phone]       Follow up with Marton Redwood, MD. Schedule an appointment as soon as possible for a visit in 1 week.   Specialty:  Internal Medicine   Contact information:   [address] [place] [address] [phone]        Consults obtained - None  Discharge Condition: Fair  Diet and Activity recommendation: See Discharge Instructions below  Discharge Instructions       Discharge Instructions    Discharge instructions    Complete by:  As directed   Follow with Primary MD Marton Redwood, MD in 7 days   Get CBC, CMP, 2 view Chest X ray checked  by Primary MD next visit.    Activity: As tolerated with Full fall precautions use walker/cane & assistance as needed   Disposition Home     Diet:  Dysphagia 3, with feeding assistance and aspiration precautions.  For Heart failure patients - Check your Weight same time everyday, if you gain over 2 pounds, or you develop in leg swelling, experience more shortness of breath or chest pain, call your Primary MD immediately. Follow Cardiac Low Salt Diet and 1.5 lit/day fluid restriction.   On your next visit with your primary care physician please Get Medicines reviewed and adjusted.   Please request your Prim.MD to go over all Hospital Tests and Procedure/Radiological results at the follow up, please get all Hospital records sent to your Prim MD by signing hospital release before you go home.   If you experience worsening of your admission symptoms, develop shortness of  breath, life threatening emergency, suicidal or homicidal thoughts you must seek medical attention immediately by calling 911 or calling your MD immediately  if symptoms less severe.  You Must read complete instructions/literature along with all the possible adverse reactions/side effects for all the Medicines you take and that have been prescribed to you. Take any new Medicines after you have completely understood and accpet all the possible adverse reactions/side effects.   Do not drive, operate heavy machinery, perform activities at heights, swimming or participation in water activities or provide baby sitting services if your were admitted  for syncope or siezures until you have seen by Primary MD or a Neurologist and advised to do so again.  Do not drive when taking Pain medications.    Do not take more than prescribed Pain, Sleep and Anxiety Medications  Special Instructions: If you have smoked or chewed Tobacco  in the last 2 yrs please stop smoking, stop any regular Alcohol  and or any Recreational drug use.  Wear Seat belts while driving.   Please note  You were cared for by a hospitalist during your hospital stay. If you have any questions about your discharge medications or the care you received while you were in the hospital after you are discharged, you can call the unit and asked to speak with the hospitalist on call if the hospitalist that took care of you is not available. Once you are discharged, your primary care physician will handle any further medical issues. Please note that NO REFILLS for any discharge medications will be authorized once you are discharged, as it is imperative that you return to your primary care physician (or establish a relationship with a primary care physician if you do not have one) for your aftercare needs so that they can reassess your need for medications and monitor your lab values.     Increase activity slowly    Complete by:  As directed                Discharge Medications       Medication List    STOP taking these medications        nitrofurantoin 100 MG capsule  Commonly known as:  MACRODANTIN      TAKE these medications        acetaminophen 325 MG tablet  Commonly known as:  TYLENOL  Take 2 tablets (650 mg total) by mouth every 6 (six) hours as needed for mild pain (or Fever >/= 101).     atenolol 25 MG tablet  Commonly known as:  TENORMIN  Take 25 mg by mouth 2 (two) times daily.     CALTRATE 600 PO  Take 1 tablet by mouth daily.     cefpodoxime 200 MG tablet  Commonly known as:  VANTIN  Take 1 tablet (200 mg total) by mouth 2 (two) times daily.     cetirizine 10 MG tablet  Commonly known as:  ZYRTEC  Take 10 mg by mouth daily.     divalproex 125 MG capsule  Commonly known as:  DEPAKOTE SPRINKLE  Take 250 mg by mouth 2 (two) times daily.     donepezil 10 MG tablet  Commonly known as:  ARICEPT  Take 10 mg by mouth daily.     feeding supplement (ENSURE ENLIVE) Liqd  Take 237 mLs by mouth 2 (two) times daily between meals.     furosemide 20 MG tablet  Commonly known as:  LASIX  Take 1 tablet by mouth 2 (two) times daily. Alternate 20mg  and 40mg  every other day.     hydrocortisone 2.5 % rectal cream  Commonly known as:  ANUSOL-HC  Place rectally 2 (two) times daily.     LORazepam 0.5 MG tablet  Commonly known as:  ATIVAN  Take 1 tablet (0.5 mg total) by mouth as directed.     multivitamin capsule  Take 1 capsule by mouth daily.     NAMENDA XR 28 MG Cp24 24 hr capsule  Generic drug:  memantine  Take 28 mg by mouth daily.  nortriptyline 25 MG capsule  Commonly known as:  PAMELOR  Take 25 mg by mouth at bedtime.     polyethylene glycol packet  Commonly known as:  MIRALAX / GLYCOLAX  Take 17 g by mouth daily as needed for mild constipation.     PREVACID PO  Take 1 tablet by mouth 2 (two) times daily.     STOOL SOFTENER PO  Take 1 capsule by mouth daily as needed (constipation).      temazepam 15 MG capsule  Commonly known as:  RESTORIL  Take 1 capsule by mouth at bedtime.     traMADol 50 MG tablet  Commonly known as:  ULTRAM  Take 25 mg by mouth every 12 (twelve) hours as needed for moderate pain.        Major procedures and Radiology Reports - PLEASE review detailed and final reports for all details, in brief -       Dg Chest 2 View  05/18/2016  CLINICAL DATA:  Cough, lethargy, altered mental status EXAM: CHEST  2 VIEW COMPARISON:  05/14/2016 FINDINGS: Cardiomediastinal silhouette is stable. There is small bilateral pleural effusion with bilateral basilar atelectasis or infiltrate. Additional patchy airspace disease is noted in right upper lobe. Findings suspicious for pneumonia. Less likely asymmetric pulmonary edema. Clinical correlation is necessary IMPRESSION: There is small bilateral pleural effusion with bilateral basilar atelectasis or infiltrate. Additional patchy airspace disease is noted in right upper lobe. Findings suspicious for pneumonia. Less likely asymmetric pulmonary edema. Clinical correlation is necessary Electronically Signed   By: Lahoma Crocker M.D.   On: 05/18/2016 11:43   Ct Head Wo Contrast  05/15/2016  CLINICAL DATA:  Initial evaluation for acute altered mental status. EXAM: CT HEAD WITHOUT CONTRAST TECHNIQUE: Contiguous axial images were obtained from the base of the skull through the vertex without intravenous contrast. COMPARISON:  Prior study from 04/21/2015. FINDINGS: Generalized age-related cerebral atrophy with chronic small vessel ischemic disease present, similar to prior. Remote lacunar infarcts within the bilateral basal ganglia, also stable. Scattered vascular calcifications within the carotid siphons. No acute intracranial hemorrhage. No acute large vessel territory infarct. No mass lesion, midline shift or mass effect. Ventricular prominence related to global parenchymal volume loss without hydrocephalus, stable. No extra-axial  fluid collection. Scalp soft tissues within normal limits. No acute abnormality about the globes and orbits. Paranasal sinuses are clear.  No mastoid effusion. Calvarium intact. IMPRESSION: 1. No acute intracranial process. 2. Stable atrophy with chronic small vessel ischemic disease. 3. Remote lacunar infarcts within the bilateral basal ganglia, stable. Electronically Signed   By: Jeannine Boga M.D.   On: 05/15/2016 01:05   Mr Brain Wo Contrast  05/23/2016  CLINICAL DATA:  Alzheimer's disease. Multiple falls. Fever and lethargy. EXAM: MRI HEAD WITHOUT CONTRAST TECHNIQUE: Multiplanar, multiecho pulse sequences of the brain and surrounding structures were obtained without intravenous contrast. COMPARISON:  CT head 05/14/2016.  MR head 03/30/2015. FINDINGS: No evidence for acute stroke, acute hemorrhage, mass lesion, or extra-axial fluid. Hydrocephalus ex vacuo. Severe perisylvian and mesial temporal brain substance loss. Chronic microvascular ischemic change throughout the periventricular and subcortical white matter. Unusual cystic lesions are redemonstrated in the BILATERAL globus pallidi, which could represent a remote toxic or metabolic insult, BILATERAL lacunar infarctions, or unusual perivascular spaces. No chronic hemorrhage. Flow voids are maintained. Partial empty sella. No tonsillar herniation. Cervical spondylosis. BILATERAL cataract extraction. No sinus or mastoid disease. IMPRESSION: Global atrophy.  Similar appearance to priors. Chronic BILATERAL basal ganglia insults, stable. No acute  intracranial findings. Electronically Signed   By: Staci Righter M.D.   On: 05/23/2016 08:58   Dg Chest Portable 1 View  05/14/2016  CLINICAL DATA:  Acute onset of fever. Code sepsis. Initial encounter. EXAM: PORTABLE CHEST 1 VIEW COMPARISON:  Chest radiograph performed 01/13/2015 FINDINGS: The lungs are well-aerated. Mild vascular congestion is noted, with mild bilateral atelectasis. There is no evidence of  pleural effusion or pneumothorax. The cardiomediastinal silhouette is within normal limits. No acute osseous abnormalities are seen. IMPRESSION: Mild vascular congestion, with mild bilateral atelectasis. Electronically Signed   By: Garald Balding M.D.   On: 05/14/2016 22:20    Micro Results      Recent Results (from the past 240 hour(s))  Blood Culture (routine x 2)     Status: Abnormal   Collection Time: 05/14/16  9:09 PM  Result Value Ref Range Status   Specimen Description BLOOD LEFT ANTECUBITAL  Final   Special Requests BOTTLES DRAWN AEROBIC AND ANAEROBIC 5ML  Final   Culture  Setup Time   Final    GRAM NEGATIVE RODS ANAEROBIC BOTTLE ONLY CRITICAL RESULT CALLED TO, READ BACK BY AND VERIFIED WITH: T GREEN 05/15/16 @ 34 M VESTAL Performed at [facility] (A)  Final   Report Status 05/17/2016 FINAL  Final   Organism ID, Bacteria ESCHERICHIA COLI  Final      Susceptibility   Escherichia coli - MIC*    AMPICILLIN >=32 RESISTANT Resistant     CEFAZOLIN <=4 SENSITIVE Sensitive     CEFEPIME <=1 SENSITIVE Sensitive     CEFTAZIDIME <=1 SENSITIVE Sensitive     CEFTRIAXONE <=1 SENSITIVE Sensitive     CIPROFLOXACIN >=4 RESISTANT Resistant     GENTAMICIN <=1 SENSITIVE Sensitive     IMIPENEM <=0.25 SENSITIVE Sensitive     TRIMETH/SULFA <=20 SENSITIVE Sensitive     AMPICILLIN/SULBACTAM 8 SENSITIVE Sensitive     PIP/TAZO <=4 SENSITIVE Sensitive     * ESCHERICHIA COLI  Blood Culture ID Panel (Reflexed)     Status: Abnormal   Collection Time: 05/14/16  9:09 PM  Result Value Ref Range Status   Enterococcus species NOT DETECTED NOT DETECTED Final   Vancomycin resistance NOT DETECTED NOT DETECTED Final   Listeria monocytogenes NOT DETECTED NOT DETECTED Final   Staphylococcus species NOT DETECTED NOT DETECTED Final   Staphylococcus aureus NOT DETECTED NOT DETECTED Final   Methicillin resistance NOT DETECTED NOT DETECTED Final   Streptococcus species NOT  DETECTED NOT DETECTED Final   Streptococcus agalactiae NOT DETECTED NOT DETECTED Final   Streptococcus pneumoniae NOT DETECTED NOT DETECTED Final   Streptococcus pyogenes NOT DETECTED NOT DETECTED Final   Acinetobacter baumannii NOT DETECTED NOT DETECTED Final   Enterobacteriaceae species DETECTED (A) NOT DETECTED Final    Comment: CRITICAL RESULT CALLED TO, READ BACK BY AND VERIFIED WITH: T GREEN 05/15/16 @ 1247 M VESTAL    Enterobacter cloacae complex NOT DETECTED NOT DETECTED Final   Escherichia coli DETECTED (A) NOT DETECTED Final    Comment: CRITICAL RESULT CALLED TO, READ BACK BY AND VERIFIED WITH: T GREEN 05/15/16 @ 1247 M VESTAL    Klebsiella oxytoca NOT DETECTED NOT DETECTED Final   Klebsiella pneumoniae NOT DETECTED NOT DETECTED Final   Proteus species NOT DETECTED NOT DETECTED Final   Serratia marcescens NOT DETECTED NOT DETECTED Final   Carbapenem resistance NOT DETECTED NOT DETECTED Final   Haemophilus influenzae NOT DETECTED NOT DETECTED Final   Neisseria meningitidis NOT  DETECTED NOT DETECTED Final   Pseudomonas aeruginosa NOT DETECTED NOT DETECTED Final   Candida albicans NOT DETECTED NOT DETECTED Final   Candida glabrata NOT DETECTED NOT DETECTED Final   Candida krusei NOT DETECTED NOT DETECTED Final   Candida parapsilosis NOT DETECTED NOT DETECTED Final   Candida tropicalis NOT DETECTED NOT DETECTED Final    Comment: Performed at [facility]  Blood Culture (routine x 2)     Status: Abnormal   Collection Time: 05/14/16  9:18 PM  Result Value Ref Range Status   Specimen Description BLOOD RIGHT ANTECUBITAL  Final   Special Requests BOTTLES DRAWN AEROBIC AND ANAEROBIC 5ML  Final   Culture  Setup Time   Final    GRAM NEGATIVE RODS ANAEROBIC BOTTLE ONLY CRITICAL RESULT CALLED TO, READ BACK BY AND VERIFIED WITH: T GREEN 05/15/16 @ 55 M VESTAL    Culture (A)  Final    ESCHERICHIA COLI SUSCEPTIBILITIES PERFORMED ON PREVIOUS CULTURE WITHIN THE LAST 5  DAYS. Performed at [facility]    Report Status 05/17/2016 FINAL  Final  Urine culture     Status: Abnormal   Collection Time: 05/14/16 11:14 PM  Result Value Ref Range Status   Specimen Description URINE, CLEAN CATCH  Final   Special Requests NONE  Final   Culture MULTIPLE SPECIES PRESENT, SUGGEST RECOLLECTION (A)  Final   Report Status 05/16/2016 FINAL  Final  Urine culture     Status: Abnormal   Collection Time: 05/16/16 10:52 AM  Result Value Ref Range Status   Specimen Description URINE, CLEAN CATCH  Final   Special Requests NONE  Final   Culture MULTIPLE SPECIES PRESENT, SUGGEST RECOLLECTION (A)  Final   Report Status 05/17/2016 FINAL  Final  Culture, blood (routine x 2)     Status: None (Preliminary result)   Collection Time: 05/18/16 10:58 AM  Result Value Ref Range Status   Specimen Description BLOOD LEFT ANTECUBITAL  Final   Special Requests BOTTLES DRAWN AEROBIC ONLY 5 CC  Final   Culture   Final    NO GROWTH 4 DAYS Performed at [facility]    Report Status PENDING  Incomplete  Culture, blood (routine x 2)     Status: None (Preliminary result)   Collection Time: 05/18/16 10:58 AM  Result Value Ref Range Status   Specimen Description BLOOD RIGHT ANTECUBITAL  Final   Special Requests BOTTLES DRAWN AEROBIC AND ANAEROBIC 10 CC EA  Final   Culture   Final    NO GROWTH 4 DAYS Performed at [facility]    Report Status PENDING  Incomplete       Today   Subjective    Sherran Needs today In bed remains pleasantly confused, denies any headache chest or abdominal pain. Not a good history and due to advanced dementia.   Objective   Blood pressure 146/66, pulse 99, temperature 98.5 F (36.9 C), temperature source Oral, resp. rate 16, height 5\' 5"  (1.651 m), weight 58.786 kg (129 lb 9.6 oz), SpO2 92 %.   Intake/Output Summary (Last 24 hours) at 05/23/16 1017 Last data filed at 05/23/16 0700  Gross per 24 hour  Intake    400 ml  Output    2175 ml  Net  -1775 ml    Exam Awake, pleasantly confused, No new F.N deficits,   [place].AT,PERRAL Supple Neck,No JVD, No cervical lymphadenopathy appriciated.  Symmetrical Chest wall movement, Good air movement bilaterally, CTAB RRR,No Gallops,Rubs or new Murmurs, No  Parasternal Heave +ve B.Sounds, Abd Soft, Non tender, No organomegaly appriciated, No rebound -guarding or rigidity. Urostomy in place No Cyanosis, Clubbing or edema, No new Rash or bruise   Data Review   CBC w Diff: Lab Results  Component Value Date   WBC 9.9 05/23/2016   HGB 8.7* 05/23/2016   HCT 26.2* 05/23/2016   PLT 251 05/23/2016   LYMPHOPCT 12 05/14/2016   MONOPCT 12 05/14/2016   EOSPCT 0 05/14/2016   BASOPCT 0 05/14/2016    CMP: Lab Results  Component Value Date   NA 141 05/23/2016   K 4.4 05/23/2016   CL 105 05/23/2016   CO2 28 05/23/2016   BUN 20 05/23/2016   CREATININE 0.45 05/23/2016   PROT 5.7* 05/15/2016   ALBUMIN 3.1* 05/15/2016   BILITOT 0.5 05/15/2016   ALKPHOS 47 05/15/2016   AST 19 05/15/2016   ALT 17 05/15/2016  .   Total Time in preparing paper work, data evaluation and todays exam - 35 minutes  Thurnell Lose M.D on 05/23/2016 at 10:17 AM  [facility]   Office  [phone]

## 2018-10-24 DEATH — deceased
